# Patient Record
Sex: Male | Born: 1970 | Race: Black or African American | Hispanic: No | Smoking: Current every day smoker
Health system: Southern US, Community
[De-identification: ages and names within clinical notes are randomized; demographics above are authoritative.]

## PROBLEM LIST (undated history)

## (undated) DIAGNOSIS — B20 Human immunodeficiency virus [HIV] disease: Secondary | ICD-10-CM

## (undated) DIAGNOSIS — I10 Essential (primary) hypertension: Secondary | ICD-10-CM

## (undated) DIAGNOSIS — E785 Hyperlipidemia, unspecified: Secondary | ICD-10-CM

## (undated) DIAGNOSIS — L0232 Furuncle of buttock: Secondary | ICD-10-CM

## (undated) DIAGNOSIS — E119 Type 2 diabetes mellitus without complications: Secondary | ICD-10-CM

## (undated) DIAGNOSIS — I639 Cerebral infarction, unspecified: Secondary | ICD-10-CM

## (undated) HISTORY — DX: Essential (primary) hypertension: I10

## (undated) HISTORY — DX: Type 2 diabetes mellitus without complications: E11.9

## (undated) HISTORY — DX: Hyperlipidemia, unspecified: E78.5

## (undated) HISTORY — DX: Cerebral infarction, unspecified: I63.9

---

## 2000-11-10 ENCOUNTER — Encounter: Admission: RE | Admit: 2000-11-10 | Discharge: 2001-02-08 | Payer: Self-pay | Admitting: Anesthesiology

## 2002-04-13 ENCOUNTER — Emergency Department (HOSPITAL_COMMUNITY): Admission: EM | Admit: 2002-04-13 | Discharge: 2002-04-13 | Payer: Self-pay | Admitting: Emergency Medicine

## 2002-05-16 ENCOUNTER — Emergency Department (HOSPITAL_COMMUNITY): Admission: EM | Admit: 2002-05-16 | Discharge: 2002-05-16 | Payer: Self-pay | Admitting: Emergency Medicine

## 2003-08-07 ENCOUNTER — Emergency Department (HOSPITAL_COMMUNITY): Admission: EM | Admit: 2003-08-07 | Discharge: 2003-08-07 | Payer: Self-pay | Admitting: Emergency Medicine

## 2003-12-13 ENCOUNTER — Emergency Department (HOSPITAL_COMMUNITY): Admission: EM | Admit: 2003-12-13 | Discharge: 2003-12-13 | Payer: Self-pay | Admitting: Emergency Medicine

## 2003-12-15 ENCOUNTER — Emergency Department (HOSPITAL_COMMUNITY): Admission: EM | Admit: 2003-12-15 | Discharge: 2003-12-15 | Payer: Self-pay | Admitting: Emergency Medicine

## 2005-07-03 ENCOUNTER — Emergency Department (HOSPITAL_COMMUNITY): Admission: EM | Admit: 2005-07-03 | Discharge: 2005-07-03 | Payer: Self-pay | Admitting: Emergency Medicine

## 2005-07-10 ENCOUNTER — Emergency Department (HOSPITAL_COMMUNITY): Admission: EM | Admit: 2005-07-10 | Discharge: 2005-07-10 | Payer: Self-pay | Admitting: Family Medicine

## 2005-10-17 ENCOUNTER — Emergency Department (HOSPITAL_COMMUNITY): Admission: EM | Admit: 2005-10-17 | Discharge: 2005-10-17 | Payer: Self-pay | Admitting: Emergency Medicine

## 2005-11-22 ENCOUNTER — Emergency Department (HOSPITAL_COMMUNITY): Admission: EM | Admit: 2005-11-22 | Discharge: 2005-11-22 | Payer: Self-pay | Admitting: Emergency Medicine

## 2006-05-02 ENCOUNTER — Emergency Department (HOSPITAL_COMMUNITY): Admission: EM | Admit: 2006-05-02 | Discharge: 2006-05-02 | Payer: Self-pay | Admitting: Emergency Medicine

## 2008-03-13 ENCOUNTER — Emergency Department (HOSPITAL_COMMUNITY): Admission: EM | Admit: 2008-03-13 | Discharge: 2008-03-13 | Payer: Self-pay | Admitting: Emergency Medicine

## 2009-03-12 ENCOUNTER — Emergency Department (HOSPITAL_COMMUNITY): Admission: EM | Admit: 2009-03-12 | Discharge: 2009-03-13 | Payer: Self-pay | Admitting: Emergency Medicine

## 2011-02-22 NOTE — Procedures (Signed)
[facility]  Patient:    Bruce Yang, Bruce Yang                     MRN: [id number] Proc. Date: 11/24/00 Adm. Date:  [id number] Attending:  Thyra Breed CC:         Alinda Deem, M.D.  Workers Academic librarian   Procedure Report  PROCEDURE:  Lumbar epidural steroid injection.  DIAGNOSES:  Lumbar spondylosis with shallow broad-based right herniated disk at L5-S1.  INTERVAL HISTORY:  The patient has noted that his leg pain is markedly improved. He continues to have some lower back discomfort.  PHYSICAL EXAMINATION: VITAL SIGNS:  Blood pressure is 127/725, heart rate 80, respiratory rate 18 and saturation is 99%.  Temperature is 98.6. Pain level is 5:10 and temperature 98.7.  BACK:  His back shows good healing from his previous injection site.  NEUROLOGIC:  Neurologic exam is grossly unchanged.  DESCRIPTION OF PROCEDURE:  After informed consent was obtained, the patient was placed in a sitting position and monitored.  His back was prepped with Betadine x 3. A skin wheal was raised at the L4-5 inner space with 1% lidocaine.  A #20 gauge Tuohy needle was introduced to the lumbar epidural space to loss of resistance to preservative-free normal saline.  There was no CSF nor blood.  Medrol 80 mg and 8 mL of preservative-free normal saline was gently injected.  The needle was flushed with preservative-free normal saline and removed intact.  POSTPROCEDURE CONDITION:  Stable.  DISCHARGE INSTRUCTIONS: 1. Resume previous diet. 2. Limitation of activities per instruction sheet. 3. Continue on current medications. 4. Follow-up with me in 1 week for third injection. DD:  11/24/00 TD:  11/24/00 Job: [id number]

## 2011-02-22 NOTE — Procedures (Signed)
[facility]  Patient:    Bruce Yang, Bruce Yang                     MRN: [id number] Proc. Date: 12/08/00 Adm. Date:  [id number] Attending:  Thyra Breed CC:         Gean Birchwood, M.D.  Workers [employer]   Procedure Report  PROCEDURE:  Lumbar epidural steroid injection.  DIAGNOSES:  Lumbar spondylosis with shallow based herniated disk at L5-S1.  INTERVAL HISTORY:  The patient has noted improvement especially in his leg discomfort following the injections. He is here for his third injection today. He has noted some hyperpigmented areas on his lower extremity.  PHYSICAL EXAMINATION:  VITAL SIGNS:  Blood pressure is 135/76, heart rate 81, respiratory rate 20 and oxygen saturation is 99% and temperature is 97.5 and pain level is 7:10.  EXTREMITIES:  He exhibits scaly, hyperpigmented-type areas over his lower extremity suggestive of localized xerostomia.  BACK:  His back shows good healing from his previous injection site.  NEUROLOGIC:  Neurologic exam is grossly unchanged.  DESCRIPTION OF PROCEDURE:  After informed consent was obtained, the patient was placed in a sitting position and monitored.  His back was prepped with Betadine x 3. A skin wheal was raised at the L4-5 inner space with 1% lidocaine.  A #20 gauge Tuohy needle was introduced through the lumbar epidural space to loss of resistance to preservative-free normal saline. There was no CSF nor blood.  The depth was 5.5 cm. Medrol 80 mg and 8 mL of preservative-free normal saline was gently injected.  The needle was flushed with preservative-free normal saline and removed intact.  POSTPROCEDURE CONDITION:  Stable.  DISCHARGE INSTRUCTIONS: 1. Resume previous diet. 2. Limitation of activities per instruction sheet. 3. Continue on current medications. 4. The patient plans to follow up with Dr. Gean Birchwood. DD:  12/08/00 TD:  12/09/00 Job: [id number]

## 2011-02-22 NOTE — Procedures (Signed)
[facility]  Patient:    Bruce Yang, Bruce Yang                       MRN: [id number] Proc. Date: 11/10/00 Attending:  Thyra Breed, M.D. CC:         Alinda Deem, M.D.   Procedure Report  DATE OF BIRTH:  1970/12/26  PROCEDURE:  Lumbar epidural steroid injection.  DIAGNOSIS:  spondylolisthesis with shallow, broad-based right herniated disk at L4-5.  ANESTHESIOLOGIST:  Thyra Breed, M.D.  HISTORY:  The patient is a very pleasant 40 year old who presents with a history of a work-related injury which occurred in August 2000.  At that time, he was welding and lifted a container and felt something give in his back.  He was evaluated in [place] and treated conservatively.  At that time, he went through physical therapy and medications.  He was found to have spondylolisthesis and was advised that he needed stabilization and fusion.  He has not received epidural steroid injections.  He moved to [place], and his care is now under Dr. Turner Daniels.  The patient has been through physical therapy since presenting back in [place].  He has received  Vioxx and Mobic with no sustained improvement. He does note that Doans helps and Aleve to a mild extent.  He describes his pain as a dull, steady, aching pain in his lower back radiating out to the right lower extremity along the posterolateral and anterior aspects of the thigh and lower leg.  He denied numbness or tingling, bowel or bladder incontinence and does have some weakness of his right lower extremity with use.  His pain is made worse by walking or lying for long periods of time, sitting for long periods of time, or lifting small amounts. it is improved by sleep.  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  He does have some facet joint arthritis at 3-4.  CURRENT MEDICATIONS:  Aleve and  Doans.  ALLERGIES:  ANTIHISTAMINES cause prostate to flare up.  FAMILY HISTORY:  Positive for hypertension and stomach problems.  PAST SURGICAL HISTORY:  Negative.  SOCIAL HISTORY:  The patient is a pack-per-day smoker.  He does not drink alcohol.  He has been off work since August 2000.  ACTIVE MEDICAL PROBLEMS:  Include history of prostatism, Helicobacter gastritis, migraines, and allergic rhinitis.  REVIEW OF SYSTEMS:  General: Negative.  Head: Significant for migraine headaches.  Eyes: Negative.  Nose, Mouth, Throat: Negative.  Ears: Negative. Pulmonary: Negative.  Cardiovascular: Negative. GI/GU/Musculoskeletal/Neurologic: See HPI and Active Medical Problems. Cutaneous: The patient has dry skin with rashes over his face from this. Hematologic: Negative.  Endocrine: Negative.  Psychiatric: Negative.  Allergy and Immunologic: See Active Medical Problems.  PHYSICAL EXAMINATION:  VITAL SIGNS:  Blood pressure 139/88, heart rate 89, respiratory rate 16, O2 saturation 97% with temperature 97.6.  Pain level 5/10.  GENERAL:  This is a pleasant, anxious male in no acute distress.  HEENT:  Head was normocephalic, atraumatic.  Eyes: Extraocular movements intact with conjunctivae and sclerae clear.  Nose: Patent nares.  Oropharynx free of lesions.  NECK:  Supple without lymphadenopathy.  EXTREMITIES:  No cyanosis, clubbing, or edema with radial pulses and dorsalis pedis pulses 2+ and symmetric.  BACK:  Revealed increased pain on hyperextension to 15 degrees with reduced pain on forward flexion.  Straight leg signs negative today.  Gait was intact.  NEUROLOGIC:  The patient is oriented x 4.  Cranial nerves II-XII grossly intact.  Deep tendon reflexes were symmetric in the upper and lower extremities with downgoing toes.  Motor was 5/5 with symmetric bulk and tone. Sensory was intact to vibratory sense and pinprick.  Coordination was grossly intact.  IMPRESSION: 1. Low back pain  predominantly out to the right lower extremity with evidence    of facet joint arthropathy, herniated disk, and spondylolisthesis by MRI. 2. Other medical problems per primary care physician which include    gastropathy, prostatism, migraines, and allergic rhinitis.  DISPOSITION:  I discussed the risks, benefits, and limitations of a lumbar epidural steroid injection in detail with the patient and reviewed the side effects of corticosteroids.  He is interested in proceeding with this.  DESCRIPTION OF PROCEDURE:  After informed consent was obtained, the patient was placed in the sitting position and monitored.  His back was prepped with Betadine x 3.  A skin wheal was raised at the L3-4 interspace with 1% lidocaine.  A 20-gauge Tuohy needle was introduced to the lumbar epidural space to loss of resistance to preservative free normal saline.  There was no CSF nor blood.  Medrol 80 mg and 8 ml preservative free normal saline was gently injected.  The needle was flushed with preservative free normal saline and removed intact.  POSTPROCEDURE CONDITION:  Stable.  DISCHARGE INSTRUCTIONS: 1. Resume previous diet. 2. Limitation of activities per instruction sheet. 3. Continue on current medications. 4. Follow up with me in one to two weeks for repeat injection. DD:  11/10/00 TD:  11/11/00 Job: [id number]

## 2011-02-22 NOTE — Procedures (Signed)
[facility]  Patient:    Bruce Yang, Bruce Yang                     MRN: [id number] Proc. Date: 11/24/00 Adm. Date:  [id number] Attending:  Thyra Breed CC:         Alinda Deem, M.D.  Workers Comp Carrier   Procedure Report  PROCEDURE:  Lumbar epidural steroid injection.  DIAGNOSIS:  Spondylolisthesis with shallow broad-based right herniated disk at L5-S1.  ANESTHESIOLOGIST:  Thyra Breed, M.D.  INTERVAL HISTORY:  The patient has noted that his leg pain is markedly improved.  He continues to have some lower back discomfort.  PHYSICAL EXAMINATION:  VITAL SIGNS:  Blood pressure 127/72, heart rate 80, respiratory rate 18, O2 saturation 99%, temperature 98.6, pain level 5/10.  BACK:  Shows good healing from previous injection site.  NEUROLOGIC:  Exam is grossly unchanged.  DESCRIPTION OF PROCEDURE:  After informed consent was obtained, the patient was placed in the sitting position and monitored.  His back was prepped with Betadine x 3.  A skin wheal was raised at the L4-5 interspace with 1% lidocaine.  A 20-gauge Tuohy needle was introduced in the lumbar epidural space to loss of resistance to preservative free normal saline.  There was no CSF nor blood.  Medrol 80 mg and 8 ml preservative free normal saline was gently injected.  The needle was flushed with preservative free normal saline and removed intact.  POSTPROCEDURE CONDITION:  Stable.  DISCHARGE INSTRUCTIONS: 1. Resume previous diet. 2. Limitation of activities per instruction sheet. 3. Continue on current medications. 4. Follow up with me in one week for a third injection. DD:  11/24/00 TD:  11/24/00 Job: [id number]

## 2011-07-04 LAB — RAPID STREP SCREEN (MED CTR MEBANE ONLY): Streptococcus, Group A Screen (Direct): NEGATIVE

## 2012-02-01 ENCOUNTER — Encounter (HOSPITAL_COMMUNITY): Payer: Self-pay

## 2012-02-01 ENCOUNTER — Emergency Department (INDEPENDENT_AMBULATORY_CARE_PROVIDER_SITE_OTHER)
Admission: EM | Admit: 2012-02-01 | Discharge: 2012-02-01 | Disposition: A | Discharge status: Home / Self Care | Payer: Self-pay | Source: Home / Self Care | Attending: Emergency Medicine | Admitting: Emergency Medicine

## 2012-02-01 ENCOUNTER — Emergency Department (INDEPENDENT_AMBULATORY_CARE_PROVIDER_SITE_OTHER): Payer: Self-pay

## 2012-02-01 DIAGNOSIS — S62309A Unspecified fracture of unspecified metacarpal bone, initial encounter for closed fracture: Secondary | ICD-10-CM

## 2012-02-01 MED ORDER — TRAMADOL HCL 50 MG PO TABS: 50.0000 mg | ORAL_TABLET | Freq: Four times a day (QID) | ORAL | Status: AC | PRN

## 2012-02-01 MED ORDER — HYDROCODONE-ACETAMINOPHEN 5-325 MG PO TABS
1.0000 | ORAL_TABLET | Freq: Once | ORAL | Status: AC
  Administered 2012-02-01: 1 via ORAL

## 2012-02-01 MED ORDER — CEPHALEXIN 500 MG PO CAPS: 500.0000 mg | ORAL_CAPSULE | Freq: Four times a day (QID) | ORAL | Status: AC

## 2012-02-01 MED ORDER — HYDROCODONE-ACETAMINOPHEN 5-325 MG PO TABS
ORAL_TABLET | ORAL | Status: AC
  Filled 2012-02-01: qty 1

## 2012-02-01 NOTE — ED Provider Notes (Signed)
Chief Complaint  Patient presents with  . Hand Injury    History of Present Illness:   Rachael Fee is a 41 year old male who fell last night landing on his left hand. Ever since then he's had pain to palpation and a visible and palpable deformity of his fifth finger MCP joint. He is unable to extend it fully. It hurts to flex. It is swollen and tender to touch. He denies any numbness or tingling.  Review of Systems:  Other than noted above, the patient denies any of the following symptoms: Systemic:  No fevers, chills, sweats, or aches.  No fatigue or tiredness. Musculoskeletal:  No joint pain, arthritis, bursitis, swelling, back pain, or neck pain. Neurological:  No muscular weakness, paresthesias, headache, or trouble with speech or coordination.  No dizziness.   PMFSH:  Past medical history, family history, social history, meds, and allergies were reviewed.  Physical Exam:   Vital signs:  BP 141/88  Pulse 89  Temp(Src) 98.6 F (37 C) (Oral)  Resp 18  SpO2 96% Gen:  Alert and oriented times 3.  In no distress. Musculoskeletal: There was a palpable and visible deformity of the distal fifth metacarpal compatible with a boxer's fracture. This was tender to touch. He is unable to extend it fully but can flex it. Otherwise, all joints had a full a ROM with no swelling, bruising or deformity.  No edema, pulses full. Extremities were warm and pink.  Capillary refill was brisk.  Skin:  Clear, warm and dry.  No rash. Neuro:  Alert and oriented times 3.  Muscle strength was normal.  Sensation was intact to light touch.   Radiology:  Dg Hand Complete Left  02/01/2012  *RADIOLOGY REPORT*  Clinical Data: Reduction of fifth metacarpal fracture.  LEFT HAND - COMPLETE 3+ VIEW  Comparison: Films earlier today.  Findings: Imaging in a cast demonstrates improved alignment at the level of the distal fifth metacarpal fracture.  There remains mild volar angulation.  No other injuries.  IMPRESSION: Improved  alignment of the fifth metacarpal.  Original Report Authenticated By: Reola Calkins, M.D.   Dg Hand Complete Left  02/01/2012  *RADIOLOGY REPORT*  Clinical Data: Pain and swelling in the region of the left fifth metacarpal following a fall last night.  LEFT HAND - COMPLETE 3+ VIEW  Comparison: None.  Findings: Left fifth metacarpal neck fracture with significant ventral angulation of the distal fragment without significant displacement.  Associated soft tissue swelling.  IMPRESSION: Fifth metacarpal neck fracture, as described above.  Original Report Authenticated By: Darrol Angel, M.D.   Course in Urgent Care Center:   I called Dr. Amanda Pea and his PA came over and reduced the fracture and casted. He will followup with Dr. Amanda Pea next week.  Assessment:  The encounter diagnosis was Fracture of metacarpal.  Plan:   1.  The following meds were prescribed:   Discharge Medication List as of 02/01/2012  1:54 PM    START taking these medications   Details  cephALEXin (KEFLEX) 500 MG capsule Take 1 capsule (500 mg total) by mouth 4 (four) times daily., Starting 02/01/2012, Until Tue 02/11/12, Print    traMADol (ULTRAM) 50 MG tablet Take 1 tablet (50 mg total) by mouth every 6 (six) hours as needed for pain., Starting 02/01/2012, Until Tue 02/11/12, Print       2.  The patient was instructed in symptomatic care, including rest and activity, elevation, application of ice and compression.  Appropriate handouts were given. 3.  The patient was told to return if becoming worse in any way, if no better in 3 or 4 days, and given some red flag symptoms that would indicate earlier return.   4.  The patient was told to follow up with Dr. Amanda Pea next week.   Reuben Likes, MD 02/01/12 2126

## 2012-02-01 NOTE — ED Notes (Signed)
Pt fell last pm and has lt hand pain and swelling.

## 2012-02-01 NOTE — Discharge Instructions (Signed)
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
## 2012-04-04 ENCOUNTER — Encounter (HOSPITAL_COMMUNITY): Payer: Self-pay | Admitting: *Deleted

## 2012-04-04 ENCOUNTER — Emergency Department (HOSPITAL_COMMUNITY)
Admission: EM | Admit: 2012-04-04 | Discharge: 2012-04-04 | Disposition: A | Discharge status: Home / Self Care | Payer: Self-pay | Attending: Emergency Medicine | Admitting: Emergency Medicine

## 2012-04-04 DIAGNOSIS — K6289 Other specified diseases of anus and rectum: Secondary | ICD-10-CM | POA: Insufficient documentation

## 2012-04-04 DIAGNOSIS — Z888 Allergy status to other drugs, medicaments and biological substances status: Secondary | ICD-10-CM | POA: Insufficient documentation

## 2012-04-04 DIAGNOSIS — F17200 Nicotine dependence, unspecified, uncomplicated: Secondary | ICD-10-CM | POA: Insufficient documentation

## 2012-04-04 HISTORY — DX: Furuncle of buttock: L02.32

## 2012-04-04 MED ORDER — LIDOCAINE HCL 2 % EX GEL: CUTANEOUS | Status: AC | PRN

## 2012-04-04 MED ORDER — OXYCODONE-ACETAMINOPHEN 5-325 MG PO TABS
1.0000 | ORAL_TABLET | Freq: Once | ORAL | Status: AC
  Administered 2012-04-04: 1 via ORAL
  Filled 2012-04-04: qty 1

## 2012-04-04 MED ORDER — METRONIDAZOLE 500 MG PO TABS: 500.0000 mg | ORAL_TABLET | Freq: Two times a day (BID) | ORAL | Status: AC

## 2012-04-04 NOTE — ED Provider Notes (Signed)
History     CSN: [id number]  Arrival date & time 04/04/12  1654   First MD Initiated Contact with Patient 04/04/12 1926      Chief Complaint  Patient presents with  . Rectal Pain    Questionable abscess  . Recurrent Skin Infections    Boil    (Consider location/radiation/quality/duration/timing/severity/associated sxs/prior treatment) HPI  She presents to the emergency department with complaints of rectal pain for 1.5 months. He admits to having abscesses in the past he also admits to having hemorrhoids in the past he states that none of these feel the same. He denies having fever, pain during de fecation, bowel or urinary incontinence or hematochezia. He denies constipation or diarrhea, denies weakness, chills, nausea or vomiting. Pt is in NAD and VSS.  Past Medical History  Diagnosis Date  . Recurrent boils of anus     History reviewed. No pertinent past surgical history.  History reviewed. No pertinent family history.  History  Substance Use Topics  . Smoking status: Current Everyday Smoker -- 1.0 packs/day    Types: Cigarettes  . Smokeless tobacco: Never Used  . Alcohol Use: No      Review of Systems   HEENT: denies blurry vision or change in hearing PULMONARY: Denies difficulty breathing and SOB CARDIAC: denies chest pain or heart palpitations MUSCULOSKELETAL:  denies being unable to ambulate ABDOMEN AL: denies abdominal pain GU: denies loss of bowel or urinary control NEURO: denies numbness and tingling in extremities SKIN: no new rashes PSYCH: patient behavior is normal NECK: Not complaining of neck pain     Allergies  Antihistamines, chlorpheniramine-type  Home Medications   Current Outpatient Rx  Name Route Sig Dispense Refill  . LIDOCAINE HCL 2 % EX GEL Topical Apply topically as needed. Apply to rectum 30 mL 0  . METRONIDAZOLE 500 MG PO TABS Oral Take 1 tablet (500 mg total) by mouth 2 (two) times daily. 14 tablet 0    BP 139/90  Pulse  82  Temp 98.1 F (36.7 C) (Oral)  Resp 16  Wt 185 lb (83.915 kg)  SpO2 98%  Physical Exam  Nursing note and vitals reviewed. Constitutional: He appears well-developed and well-nourished. No distress.  HENT:  Head: Normocephalic and atraumatic.  Eyes: Pupils are equal, round, and reactive to light.  Neck: Normal range of motion. Neck supple.  Cardiovascular: Normal rate and regular rhythm.   Pulmonary/Chest: Effort normal.  Abdominal: Soft.  Genitourinary: Prostate normal. Rectal exam shows tenderness. Rectal exam shows no external hemorrhoid, no internal hemorrhoid, no fissure, no mass and anal tone normal.  Neurological: He is alert.  Skin: Skin is warm and dry.    ED Course  Procedures (including critical care time)  Labs Reviewed - No data to display No results found.   1. Rectal pain       MDM  Benign rectal exam. Significant pain during exam, I have prescribed Flagyl to cover for infection of the GI tract causing pain and written the patient for Lidocaine Jelly. He has also been given referral to GI.   Pt has been advised of the symptoms that warrant their return to the ED. Patient has voiced understanding and has agreed to follow-up with the PCP or specialist.         Dorthula Matas, PA 04/04/12 2112

## 2012-04-04 NOTE — ED Provider Notes (Signed)
Medical screening examination/treatment/procedure(s) were performed by non-physician practitioner and as supervising physician I was immediately available for consultation/collaboration.   Celene Kras, MD 04/04/12 2112

## 2012-04-04 NOTE — ED Notes (Signed)
Pt from home with reports of painful areas to rectal area that began about 2 months ago with worsening pain today. Pt endorses hx of boils that usually "pop on their own".

## 2012-04-04 NOTE — Discharge Instructions (Signed)
Proctalgia Fugax Proctalgia fugax is a very short episode of intense rectal pain. It can last from seconds to minutes. It often occurs in the night, and awakens the person from sleep. It is not a sign of cancer.  CAUSES  The cause of this often intense rectal pain is not known. One possible cause may be spasm of the pelvic muscles or of the lowest part of the large intestine.  SYMPTOMS  The pain of proctalgia fugax:  Is intensely severe.   Lasts from only a few seconds to thirty minutes.   Usually awakens the person from sleep.  DIAGNOSIS  In order to make sure that there are no other problems, diagnostic tests may be done such as:   Anoscopy. This is a lighted scope that is put into the rectum to look for abnormalities.   Barium enema. X-rays are taken after administering a radio-sensitive material.  TREATMENT  A number of things have been used to try to treat this condition, including:  Medications.   Warm baths.   Relaxation techniques.   Gentle massage of the painful area.  HOME CARE INSTRUCTIONS   Take all medications exactly as directed.   Follow any prescribed diet.   Follow instructions regarding both rest and physical activity.   Learn progressive relaxation techniques.  SEEK IMMEDIATE MEDICAL CARE IF:   Your pain does not get better in the usual amount of time.   You develop any new symptoms.  Document Released: 06/18/2001 Document Revised: 09/12/2011 Document Reviewed: 11/24/2008 [employer] Patient Information 2012 [employer]. 

## 2014-03-12 ENCOUNTER — Emergency Department (HOSPITAL_COMMUNITY): Payer: No Typology Code available for payment source

## 2014-03-12 ENCOUNTER — Encounter (HOSPITAL_COMMUNITY): Payer: Self-pay | Admitting: Emergency Medicine

## 2014-03-12 ENCOUNTER — Emergency Department (HOSPITAL_COMMUNITY)
Admission: EM | Admit: 2014-03-12 | Discharge: 2014-03-12 | Disposition: A | Discharge status: Home / Self Care | Payer: No Typology Code available for payment source | Attending: Emergency Medicine | Admitting: Emergency Medicine

## 2014-03-12 DIAGNOSIS — S62509A Fracture of unspecified phalanx of unspecified thumb, initial encounter for closed fracture: Secondary | ICD-10-CM

## 2014-03-12 DIAGNOSIS — Z791 Long term (current) use of non-steroidal anti-inflammatories (NSAID): Secondary | ICD-10-CM | POA: Insufficient documentation

## 2014-03-12 DIAGNOSIS — Y92410 Unspecified street and highway as the place of occurrence of the external cause: Secondary | ICD-10-CM | POA: Insufficient documentation

## 2014-03-12 DIAGNOSIS — F17200 Nicotine dependence, unspecified, uncomplicated: Secondary | ICD-10-CM | POA: Insufficient documentation

## 2014-03-12 DIAGNOSIS — S62609A Fracture of unspecified phalanx of unspecified finger, initial encounter for closed fracture: Secondary | ICD-10-CM | POA: Insufficient documentation

## 2014-03-12 DIAGNOSIS — Y9389 Activity, other specified: Secondary | ICD-10-CM | POA: Insufficient documentation

## 2014-03-12 MED ORDER — SILVER SULFADIAZINE 1 % EX CREA
TOPICAL_CREAM | Freq: Once | CUTANEOUS | Status: AC
  Administered 2014-03-12: 19:00:00 via TOPICAL
  Filled 2014-03-12: qty 50

## 2014-03-12 MED ORDER — CYCLOBENZAPRINE HCL 10 MG PO TABS: 10.0000 mg | ORAL_TABLET | Freq: Two times a day (BID) | ORAL | Status: DC | PRN

## 2014-03-12 MED ORDER — SILVER SULFADIAZINE 1 % EX CREA: 1.0000 "application " | TOPICAL_CREAM | Freq: Every day | CUTANEOUS | Status: DC

## 2014-03-12 MED ORDER — IBUPROFEN 800 MG PO TABS: 800.0000 mg | ORAL_TABLET | Freq: Three times a day (TID) | ORAL | Status: DC
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
## 2014-03-12 NOTE — ED Notes (Signed)
Dressing applied at this time.
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
## 2014-03-14 NOTE — ED Provider Notes (Signed)
Medical screening examination/treatment/procedure(s) were performed by non-physician practitioner and as supervising physician I was immediately available for consultation/collaboration.   EKG Interpretation None       Hurman Horn, MD 03/14/14 2211

## 2014-08-27 ENCOUNTER — Emergency Department (INDEPENDENT_AMBULATORY_CARE_PROVIDER_SITE_OTHER)
Admission: EM | Admit: 2014-08-27 | Discharge: 2014-08-27 | Disposition: A | Discharge status: Home / Self Care | Payer: Self-pay | Source: Home / Self Care | Attending: Emergency Medicine | Admitting: Emergency Medicine

## 2014-08-27 ENCOUNTER — Encounter (HOSPITAL_COMMUNITY): Payer: Self-pay | Admitting: Emergency Medicine

## 2014-08-27 DIAGNOSIS — J019 Acute sinusitis, unspecified: Secondary | ICD-10-CM

## 2014-08-27 DIAGNOSIS — J029 Acute pharyngitis, unspecified: Secondary | ICD-10-CM

## 2014-08-27 LAB — POCT RAPID STREP A: Streptococcus, Group A Screen (Direct): NEGATIVE

## 2014-08-27 MED ORDER — AMOXICILLIN 500 MG PO CAPS: 500.0000 mg | ORAL_CAPSULE | Freq: Three times a day (TID) | ORAL | Status: DC

## 2014-08-27 NOTE — ED Notes (Signed)
C/o ST , left ear pain x 3 weeks

## 2014-08-27 NOTE — Discharge Instructions (Signed)

## 2014-08-27 NOTE — ED Provider Notes (Signed)
  Chief Complaint   Sore Throat   History of Present Illness   Bruce Yang is a [age] year old male who has had a two-week history of sore throat, ear congestion, subjective fever, chills, nasal congestion with white rhinorrhea, headache, sinus pressure, aching in his eyes, and cough productive of white sputum. He denies any GI symptoms. He's had no sick exposures.  Review of Systems   Other than as noted above, the patient denies any of the following symptoms: Systemic:  No fevers, chills, sweats, or myalgias. Eye:  No redness or discharge. ENT:  No ear pain, headache, nasal congestion, drainage, sinus pressure, or sore throat. Neck:  No neck pain, stiffness, or swollen glands. Lungs:  No cough, sputum production, hemoptysis, wheezing, chest tightness, shortness of breath or chest pain. GI:  No abdominal pain, nausea, vomiting or diarrhea.  PMFSH   Past medical history, family history, social history, meds, and allergies were reviewed.   Physical exam   Vital signs:  BP 149/91 mmHg  Pulse 97  Temp(Src) 99.8 F (37.7 C) (Oral)  Resp 16  SpO2 97% General:  Alert and oriented.  In no distress.  Skin warm and dry. Eye:  No conjunctival injection or drainage. Lids were normal. ENT:  TMs and canals were normal, without erythema or inflammation.  Nasal mucosa was clear and uncongested, without drainage.  Mucous membranes were moist.  Pharynx was clear with no exudate or drainage.  There were no oral ulcerations or lesions. Neck:  Supple, no adenopathy, tenderness or mass. Lungs:  No respiratory distress.  Lungs were clear to auscultation, without wheezes, rales or rhonchi.  Breath sounds were clear and equal bilaterally.  Heart:  Regular rhythm, without gallops, murmers or rubs. Skin:  Clear, warm, and dry, without rash or lesions.  Labs   Results for orders placed or performed during the hospital encounter of 08/27/14  POCT rapid strep A [facility]  Result Value Ref  Range   Streptococcus, Group A Screen (Direct) NEGATIVE NEGATIVE    Assessment     The primary encounter diagnosis was Pharyngitis. A diagnosis of Acute sinusitis, recurrence not specified, unspecified location was also pertinent to this visit.  There is no evidence of pneumonia, strep throat, otitis media.    Plan    1.  Meds:  The following meds were prescribed:   Discharge Medication List as of 08/27/2014 12:36 PM    START taking these medications   Details  amoxicillin (AMOXIL) 500 MG capsule Take 1 capsule (500 mg total) by mouth 3 (three) times daily., Starting 08/27/2014, Until Discontinued, Normal        2.  Patient Education/Counseling:  The patient was given appropriate handouts, self care instructions, and instructed in symptomatic relief.  Instructed to get extra fluids and extra rest.    3.  Follow up:  The patient was told to follow up here if no better in 3 to 4 days, or sooner if becoming worse in any way, and given some red flag symptoms such as increasing fever, difficulty breathing, chest pain, or persistent vomiting which would prompt immediate return.       Reuben Likesavid C Delmy Holdren, MD 08/27/14 [phone]

## 2014-08-29 LAB — CULTURE, GROUP A STREP

## 2015-08-01 ENCOUNTER — Emergency Department (HOSPITAL_COMMUNITY)
Admission: EM | Admit: 2015-08-01 | Discharge: 2015-08-01 | Disposition: A | Discharge status: Home / Self Care | Payer: No Typology Code available for payment source | Source: Home / Self Care | Attending: Emergency Medicine | Admitting: Emergency Medicine

## 2015-08-01 ENCOUNTER — Encounter (HOSPITAL_COMMUNITY): Payer: Self-pay | Admitting: Emergency Medicine

## 2015-08-01 DIAGNOSIS — T148XXA Other injury of unspecified body region, initial encounter: Secondary | ICD-10-CM

## 2015-08-01 DIAGNOSIS — S29012A Strain of muscle and tendon of back wall of thorax, initial encounter: Secondary | ICD-10-CM | POA: Diagnosis not present

## 2015-08-01 MED ORDER — NAPROXEN 375 MG PO TABS: 375.0000 mg | ORAL_TABLET | Freq: Two times a day (BID) | ORAL | Status: DC

## 2015-08-01 MED ORDER — DICLOFENAC SODIUM 1 % TD GEL: 1.0000 "application " | Freq: Four times a day (QID) | TRANSDERMAL | Status: DC

## 2015-08-01 NOTE — Discharge Instructions (Signed)
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
## 2015-08-01 NOTE — ED Notes (Signed)
C/o left shoulder pain onset 2 weeks Denies inj/trauma; recalls recent wt lifting at the gym A&O x4... No acute distress.

## 2015-11-08 ENCOUNTER — Encounter (HOSPITAL_COMMUNITY): Payer: Self-pay | Admitting: Emergency Medicine

## 2015-11-08 ENCOUNTER — Emergency Department (HOSPITAL_COMMUNITY): Payer: No Typology Code available for payment source

## 2015-11-08 ENCOUNTER — Emergency Department (HOSPITAL_COMMUNITY)
Admission: EM | Admit: 2015-11-08 | Discharge: 2015-11-08 | Disposition: A | Discharge status: Home / Self Care | Payer: No Typology Code available for payment source | Attending: Physician Assistant | Admitting: Physician Assistant

## 2015-11-08 DIAGNOSIS — K529 Noninfective gastroenteritis and colitis, unspecified: Secondary | ICD-10-CM | POA: Insufficient documentation

## 2015-11-08 DIAGNOSIS — R1084 Generalized abdominal pain: Secondary | ICD-10-CM

## 2015-11-08 DIAGNOSIS — R110 Nausea: Secondary | ICD-10-CM

## 2015-11-08 DIAGNOSIS — F17210 Nicotine dependence, cigarettes, uncomplicated: Secondary | ICD-10-CM | POA: Insufficient documentation

## 2015-11-08 LAB — URINALYSIS, ROUTINE W REFLEX MICROSCOPIC
Bilirubin Urine: NEGATIVE
Glucose, UA: >1000 mg/dL — AB
Hgb urine dipstick: NEGATIVE
Ketones, ur: NEGATIVE mg/dL
Leukocytes, UA: NEGATIVE
Nitrite: NEGATIVE
Protein, ur: NEGATIVE mg/dL
Specific Gravity, Urine: 1.041 — ABNORMAL HIGH (ref 1.005–1.030)
pH: 5.5 (ref 5.0–8.0)

## 2015-11-08 LAB — CBC
HCT: 40.4 % (ref 39.0–52.0)
Hemoglobin: 13.6 g/dL (ref 13.0–17.0)
MCH: 30.1 pg (ref 26.0–34.0)
MCHC: 33.7 g/dL (ref 30.0–36.0)
MCV: 89.4 fL (ref 78.0–100.0)
Platelets: 303 10*3/uL (ref 150–400)
RBC: 4.52 MIL/uL (ref 4.22–5.81)
RDW: 12.1 % (ref 11.5–15.5)
WBC: 13 10*3/uL — ABNORMAL HIGH (ref 4.0–10.5)

## 2015-11-08 LAB — COMPREHENSIVE METABOLIC PANEL
ALT: 31 U/L (ref 17–63)
AST: 21 U/L (ref 15–41)
Albumin: 4.3 g/dL (ref 3.5–5.0)
Alkaline Phosphatase: 118 U/L (ref 38–126)
Anion gap: 12 (ref 5–15)
BUN: 10 mg/dL (ref 6–20)
CO2: 23 mmol/L (ref 22–32)
Calcium: 8.9 mg/dL (ref 8.9–10.3)
Chloride: 100 mmol/L — ABNORMAL LOW (ref 101–111)
Creatinine, Ser: 0.81 mg/dL (ref 0.61–1.24)
GFR calc Af Amer: >60 mL/min (ref >60)
GFR calc non Af Amer: >60 mL/min (ref >60)
Glucose, Bld: 263 mg/dL — ABNORMAL HIGH (ref 65–99)
Potassium: 3.8 mmol/L (ref 3.5–5.1)
Sodium: 135 mmol/L (ref 135–145)
Total Bilirubin: 0.7 mg/dL (ref 0.3–1.2)
Total Protein: 7.6 g/dL (ref 6.5–8.1)

## 2015-11-08 LAB — URINE MICROSCOPIC-ADD ON: Bacteria, UA: NONE SEEN

## 2015-11-08 LAB — LIPASE, BLOOD: Lipase: 26 U/L (ref 11–51)

## 2015-11-08 MED ORDER — SODIUM CHLORIDE 0.9 % IV BOLUS (SEPSIS)
1000.0000 mL | Freq: Once | INTRAVENOUS | Status: AC
  Administered 2015-11-08: 1000 mL via INTRAVENOUS

## 2015-11-08 MED ORDER — MORPHINE SULFATE (PF) 4 MG/ML IV SOLN
4.0000 mg | Freq: Once | INTRAVENOUS | Status: DC
  Filled 2015-11-08: qty 1

## 2015-11-08 MED ORDER — IOHEXOL 300 MG/ML  SOLN
25.0000 mL | Freq: Once | INTRAMUSCULAR | Status: AC | PRN
  Administered 2015-11-08: 25 mL via ORAL

## 2015-11-08 MED ORDER — IBUPROFEN 800 MG PO TABS: 800.0000 mg | ORAL_TABLET | Freq: Three times a day (TID) | ORAL | Status: DC

## 2015-11-08 MED ORDER — CIPROFLOXACIN HCL 500 MG PO TABS: 500.0000 mg | ORAL_TABLET | Freq: Two times a day (BID) | ORAL | Status: DC

## 2015-11-08 MED ORDER — METRONIDAZOLE 500 MG PO TABS: 500.0000 mg | ORAL_TABLET | Freq: Three times a day (TID) | ORAL | Status: DC

## 2015-11-08 MED ORDER — ACETAMINOPHEN 325 MG PO TABS
650.0000 mg | ORAL_TABLET | Freq: Once | ORAL | Status: AC
  Administered 2015-11-08: 650 mg via ORAL
  Filled 2015-11-08: qty 2

## 2015-11-08 MED ORDER — IOHEXOL 300 MG/ML  SOLN
100.0000 mL | Freq: Once | INTRAMUSCULAR | Status: AC | PRN
  Administered 2015-11-08: 100 mL via INTRAVENOUS

## 2015-11-08 NOTE — ED Notes (Signed)
Patient transported to CT 

## 2015-11-08 NOTE — Progress Notes (Signed)
Entered in d/c instructions please use the resources provided to you by the ED Case manager to find a pcp for follow up care Schedule an appointment as soon as possible for a visit As needed

## 2015-11-08 NOTE — ED Provider Notes (Signed)
CSN: [id number]     Arrival date & time 11/08/15  1013 History   First MD Initiated Contact with Patient 11/08/15 1100     Chief Complaint  Patient presents with  . Abdominal Pain     (Consider location/radiation/quality/duration/timing/severity/associated sxs/prior Treatment) HPI   Bruce Yang is a 45 y.o. male with PMH significant for recurrent boils of anus who presents with 6 days of intermittent, moderate, worsening, generalized, cramping abdominal pain that radiates to the back.  Aggravating factors include BMs.  Modifying factors include belching.  No meds PTA.  Patient reports last night he began experiencing NBNB emesis and nausea.  He reports 2 episodes.  Endorses generalized weakness, headache, subjective fever, and changes in BMs.  He reports he alternates between hard stools and soft stools.  Last BM this morning.  Denies tenesmus, melena, hematochezia, CP, SOB, or urinary symptoms.  No history of abdominal surgeries.  Endorses sick contacts at work.  Denies recent abx use or travel.  Denies EtOH use.  He smokes marijuana, and when asked to quantify he states "all day every day".  He smokes tobacco, approximately 0.5 ppd.     Past Medical History  Diagnosis Date  . Recurrent boils of anus    History reviewed. No pertinent past surgical history. No family history on file. Social History  Substance Use Topics  . Smoking status: Current Every Day Smoker -- 1.00 packs/day    Types: Cigarettes  . Smokeless tobacco: Never Used  . Alcohol Use: No    Review of Systems  All other systems negative unless otherwise stated in HPI   Allergies  Antihistamines, chlorpheniramine-type  Home Medications   Prior to Admission medications   Medication Sig Start Date End Date Taking? Authorizing Provider  amoxicillin (AMOXIL) 500 MG capsule Take 1 capsule (500 mg total) by mouth 3 (three) times daily. Patient not taking: Reported on 11/08/2015 08/27/14   Reuben Likes, MD   ciprofloxacin (CIPRO) 500 MG tablet Take 1 tablet (500 mg total) by mouth 2 (two) times daily. 11/08/15   Cheri Fowler, PA-C  cyclobenzaprine (FLEXERIL) 10 MG tablet Take 1 tablet (10 mg total) by mouth 2 (two) times daily as needed for muscle spasms. Patient not taking: Reported on 11/08/2015 03/12/14   Marlon Pel, PA-C  diclofenac sodium (VOLTAREN) 1 % GEL Apply 1 application topically 4 (four) times daily. Patient not taking: Reported on 11/08/2015 08/01/15   Hayden Rasmussen, NP  ibuprofen (ADVIL,MOTRIN) 800 MG tablet Take 1 tablet (800 mg total) by mouth 3 (three) times daily. 11/08/15   Cheri Fowler, PA-C  metroNIDAZOLE (FLAGYL) 500 MG tablet Take 1 tablet (500 mg total) by mouth 3 (three) times daily. 11/08/15   Cheri Fowler, PA-C  naproxen (NAPROSYN) 375 MG tablet Take 1 tablet (375 mg total) by mouth 2 (two) times daily. Patient not taking: Reported on 11/08/2015 08/01/15   Hayden Rasmussen, NP  silver sulfADIAZINE (SILVADENE) 1 % cream Apply 1 application topically daily. Patient not taking: Reported on 11/08/2015 03/12/14   Marlon Pel, PA-C   BP 131/82 mmHg  Pulse 81  Temp(Src) 98.7 F (37.1 C) (Oral)  Resp 17  SpO2 100% Physical Exam  Constitutional: He is oriented to person, place, and time. He appears well-developed and well-nourished.  Non-toxic appearance. He does not have a sickly appearance. He does not appear ill.  HENT:  Head: Normocephalic and atraumatic.  Mouth/Throat: Oropharynx is clear and moist.  Eyes: Conjunctivae are normal. Pupils are equal, round, and reactive to  light.  Neck: Normal range of motion. Neck supple.  Cardiovascular: Normal rate, regular rhythm and normal heart sounds.   No murmur heard. Pulmonary/Chest: Effort normal and breath sounds normal. No accessory muscle usage or stridor. No respiratory distress. He has no wheezes. He has no rhonchi. He has no rales.  Abdominal: Soft. Bowel sounds are normal. He exhibits distension (mild). There is generalized tenderness. There  is guarding (mild). There is no rebound.  Musculoskeletal: Normal range of motion.  Lymphadenopathy:    He has no cervical adenopathy.  Neurological: He is alert and oriented to person, place, and time.  Speech clear without dysarthria.  Skin: Skin is warm and dry.  Psychiatric: He has a normal mood and affect. His behavior is normal.    ED Course  Procedures (including critical care time) Labs Review Labs Reviewed  COMPREHENSIVE METABOLIC PANEL - Abnormal; Notable for the following:    Chloride 100 (*)    Glucose, Bld 263 (*)    All other components within normal limits  CBC - Abnormal; Notable for the following:    WBC 13.0 (*)    All other components within normal limits  URINALYSIS, ROUTINE W REFLEX MICROSCOPIC (NOT AT [facility]) - Abnormal; Notable for the following:    Specific Gravity, Urine 1.041 (*)    Glucose, UA >1000 (*)    All other components within normal limits  URINE MICROSCOPIC-ADD ON - Abnormal; Notable for the following:    Squamous Epithelial / LPF 0-5 (*)    All other components within normal limits  LIPASE, BLOOD    Imaging Review Ct Abdomen Pelvis W Contrast  11/08/2015  CLINICAL DATA:  Generalized abdominal pain for 6 days. EXAM: CT ABDOMEN AND PELVIS WITH CONTRAST TECHNIQUE: Multidetector CT imaging of the abdomen and pelvis was performed using the standard protocol following bolus administration of intravenous contrast. CONTRAST:  OMNIPAQUE IOHEXOL 300 MG/ML  SOLN COMPARISON:  None. FINDINGS: Lower chest: Subpleural 3 mm anterior right middle lobe solid pulmonary nodule (series 4/image 9). Subpleural 8 x 4 mm (average 6 mm) pulmonary nodule adjacent to the right major fissure in the anterior right lower lobe (series 4/image 3). Basilar left lower lobe 6 mm solid pulmonary nodule (series 4/image 13) . Hepatobiliary: Diffuse hepatic steatosis. No liver mass. Normal gallbladder with no radiopaque cholelithiasis. No biliary ductal dilatation. Pancreas: Normal,  with no mass or duct dilation. Spleen: Normal size. No mass. Adrenals/Urinary Tract: Normal adrenals. No hydronephrosis. No renal mass. There are nonobstructing 2 mm stones in the interpolar kidneys bilaterally. Normal bladder. Stomach/Bowel: Grossly normal stomach. Normal caliber small bowel with no small bowel wall thickening. Normal appendix. There is borderline mild wall thickening with associated minimal pericolonic fat stranding involving the descending colon and sigmoid colon, with associated fluid levels in the distal colon. Vascular/Lymphatic: Atherosclerotic nonaneurysmal abdominal aorta. Patent portal, splenic, hepatic and renal veins. No pathologically enlarged lymph nodes in the abdomen or pelvis. Nonspecific mild haziness of the central mesentery. Reproductive: Mild prostatomegaly with nonspecific internal prostatic calcifications. Other: No pneumoperitoneum, ascites or focal fluid collection. Musculoskeletal: No aggressive appearing focal osseous lesions. 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 IMPRESSION: 1. CT findings suggestive of a mild nonspecific infectious or inflammatory colitis of the distal colon, with the differential including C. diff colitis. No colonic diverticulosis. 2. Pulmonary nodules at the lung bases measuring up to 6 mm. If the patient is at high risk for bronchogenic carcinoma, follow-up chest CT  at 6-12 months is recommended. If the patient is at low risk for bronchogenic carcinoma, follow-up chest CT at 12 months is recommended. This recommendation follows the consensus statement: Guidelines for Management of Small Pulmonary Nodules Detected on CT Scans: A Statement from the Fleischner Society as published in Radiology 2005;237:395-400. 3. Tiny nonobstructing stones in both kidneys.  No hydronephrosis. 4. Diffuse hepatic steatosis. 5. 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 Electronically Signed   By: Delbert Phenix M.D.   On: 11/08/2015 15:06   I have personally reviewed and evaluated these images and lab results as part of my medical decision-making.   EKG Interpretation None      MDM   Final diagnoses:  Generalized abdominal pain  Nausea  Colitis    Patient presents with generalized abdominal pain and N/V.  VSS, NAD.  On exam, mild distension and generalized tenderness with mild guarding.  Labs largely unremarkable.  UA remarkable for >1000 glucose; otherwise negative.  CMP remarkable for glucose 263; otherwise unremarkble. Patient will follow up with PCP regarding glucose management. CBC remarkable for WBC 13.  CT abdomen/pelvis remarkable for mild nonspecific infectious or inflammatory colitis of distal colon.  No recent abx or risk factors.  Doubt c. diff.  CT also remarkable for pulmonary nodules at the lung bases measuring up to 6 mm.  Recommend follow-up chest CT at 6-12 months given smoking history.  Tiny nonobstructing stones in both kidneys.  No hydronephrosis. Diffuse hepatic steatosis.  B/l L5 pars defects with severe degenerative disc disease and 9 mm anterolisthesis at L5-S1.  Plan to discharge home with ibuprofen, cipro, and flagyl.  Discussed findings and plan with patient.  Follow up PCP.  Discussed return precautions.  Patient agrees and acknowledges the above plan for discharge. Case has been discussed with Dr. Corlis Leak who agrees with the above plan for discharge.      Cheri Fowler, PA-C 11/08/15 1550  Courteney Randall An, MD 11/10/15 1534

## 2015-11-08 NOTE — Progress Notes (Signed)
Pt confirms he has [employer] pos plan but no pcp  CM provided pt a 10 page list of [facility] in network pcp for follow up care  Pt appreciative of resources provided

## 2015-11-08 NOTE — ED Notes (Signed)
Per pt, states abdominal pain, possible umbilical hernia-vomiting since last night-loose stool-was seen at [facility] today-dent here for eval

## 2015-11-08 NOTE — Discharge Instructions (Signed)
Colitis Colitis is inflammation of the colon. Colitis may last a short time (acute) or it may last a long time (chronic). CAUSES This condition may be caused by:  Viruses.  Bacteria.  Reactions to medicine.  Certain autoimmune diseases, such as Crohn disease or ulcerative colitis. SYMPTOMS Symptoms of this condition include:  Diarrhea.  Passing bloody or tarry stool.  Pain.  Fever.  Vomiting.  Tiredness (fatigue).  Weight loss.  Bloating.  Sudden increase in abdominal pain.  Having fewer bowel movements than usual. DIAGNOSIS This condition is diagnosed with a stool test or a blood test. You may also have other tests, including X-rays, a CT scan, or a colonoscopy. TREATMENT Treatment may include:  Resting the bowel. This involves not eating or drinking for a period of time.  Fluids that are given through an IV tube.  Medicine for pain and diarrhea.  Antibiotic medicines.  Cortisone medicines.  Surgery. HOME CARE INSTRUCTIONS Eating and Drinking  Follow instructions from your health care provider about eating or drinking restrictions.  Drink enough fluid to keep your urine clear or pale yellow.  Work with a dietitian to determine which foods cause your condition to flare up.  Avoid foods that cause flare-ups.  Eat a well-balanced diet. Medicines  Take over-the-counter and prescription medicines only as told by your health care provider.  If you were prescribed an antibiotic medicine, take it as told by your health care provider. Do not stop taking the antibiotic even if you start to feel better. General Instructions  Keep all follow-up visits as told by your health care provider. This is important. SEEK MEDICAL CARE IF:  Your symptoms do not go away.  You develop new symptoms. SEEK IMMEDIATE MEDICAL CARE IF:  You have a fever that does not go away with treatment.  You develop chills.  You have extreme weakness, fainting, or  dehydration.  You have repeated vomiting.  You develop severe pain in your abdomen.  You pass bloody or tarry stool.   This information is not intended to replace advice given to you by your health care provider. Make sure you discuss any questions you have with your health care provider.   Document Released: 10/31/2004 Document Revised: 06/14/2015 Document Reviewed: 01/16/2015 Elsevier Interactive Patient Education 2016 [employer].   Emergency Department Resource Guide 1) Find a Doctor and Pay Out of Pocket Although you won't have to find out who is covered by your insurance plan, it is a good idea to ask around and get recommendations. You will then need to call the office and see if the doctor you have chosen will accept you as a new patient and what types of options they offer for patients who are self-pay. Some doctors offer discounts or will set up payment plans for their patients who do not have insurance, but you will need to ask so you aren't surprised when you get to your appointment.  2) Contact Your Local Health Department Not all health departments have doctors that can see patients for sick visits, but many do, so it is worth a call to see if yours does. If you don't know where your local health department is, you can check in your phone book. The CDC also has a tool to help you locate your state's health department, and many state websites also have listings of all of their local health departments.  3) Find a [facility] If your illness is not likely to be very severe or complicated, you may  want to try a walk in clinic. These are popping up all over the country in pharmacies, drugstores, and shopping centers. They're usually staffed by nurse practitioners or physician assistants that have been trained to treat common illnesses and complaints. They're usually fairly quick and inexpensive. However, if you have serious medical issues or chronic medical problems, these are  probably not your best option.  No Primary Care Doctor: - Call Health Connect at  [phone] - they can help you locate a primary care doctor that  accepts your insurance, provides certain services, etc. - Physician Referral Service- [phone]  Chronic Pain Problems: Organization         Address  Phone   Notes  [facility]  [phone] Patients need to be referred by their primary care doctor.   Medication Assistance: Organization         Address  Phone   Notes  [facility] Medication [facility] [address] [place], [place] [address] [phone] --Must be a resident of [facility] -- Must have NO insurance coverage whatsoever (no Medicaid/ Medicare, etc.) -- The pt. MUST have a primary care doctor that directs their care regularly and follows them in the community   [facility]ist  [phone]   [employer]  [phone]    Agencies that provide inexpensive medical care: Organization         Address  Phone   Notes  [place]  [phone]   [facility]    [phone]   [facility],  [address] [phone]   [place] [address] [place] [phone]   Planned Parenthood    [phone]   [facility]    [phone]   [place] [address], [place] Phone:  [phone], Fax:  [phone] Hours of Operation:  9 am - 6 pm, M-F.  Also accepts Medicaid/Medicare and self-pay.  [facility] for [address], [address], [place] Phone: [phone], Fax: [phone]. Hours of Operation:  8:30 am - 5:30 pm, M-F.  Also accepts Medicaid and self-pay.  [facility] [address], [place] Phone: [phone]   [place], [place], [place] [phone], Ext. [phone] Mondays & Thursdays:  7-9 AM.  First 15 patients are seen on a first come, first serve basis.    [place] Providers:  Organization         Address  Phone   Notes  [facility] [address], [place] [phone] Also accepts self-pay patients.  [facility] [id number] [place] [address], [place]  [phone]   [place], [address], [place] [phone]   [facility] [address] [place] [phone]   Lucianne Lei [address], [place]   [phone] Only accepts [place] Access [place] patients after they have their name applied to their card.   Self-Pay (no insurance) in [facility]:  Organization         Address  Phone   Notes  Sickle Cell Patients, [facility] [place] [phone]   [facility] [place] [phone])  [facility] [place]  [place], [address], [place] [phone]   [facility]/Dr. Osei-Bonsu  [address] [place] or [address], [place] [phone] Phone number for both [place] and [place] locations is the same.  Urgent Medical and [facility] [address], [place] [phone]   [facility] [address] [place] or [address] [phone]   [facility] [address] [place] [phone], phone; [phone], fax Sees patients 1st and 3rd Saturday of every month.  Must not qualify for public or private insurance (i.e. Medicaid, Medicare, [place] Health Choice, Veterans' Benefits)  Household income should be no more than 200% of the poverty level The clinic cannot treat you if you are pregnant or think you are pregnant  Sexually transmitted diseases are not treated at the clinic.     Dental Care: Organization         Address  Phone  Notes  [facility] Department of [facility] [phone] Accepts children up to age 55 who are enrolled in [place] or [place]; pregnant women with a Medicaid card; and children who have applied for Medicaid or [place] Health Choice, but were declined, whose parents can pay a reduced fee at time of service.  [facility] Department of [facility]  [address], [place] [phone] Accepts children up to age 89 who are enrolled in [place] or [place]; pregnant women with a Medicaid card; and children who have applied for Medicaid or [place] Health Choice, but were declined, whose parents can pay a reduced fee at time of service.  [place] [facility] Access PROGRAM  [place] [phone] Patients are seen by appointment only. Walk-ins are not accepted. Olds will see patients 44 years of age and older. Monday - Tuesday (8am-5pm) Most Wednesdays (8:30-5pm) $30 per visit, cash only  [facility] Adult Dental Access PROGRAM  [address], [facility] [phone] Patients are seen by appointment only. Walk-ins are not accepted. [place] will see patients 49 years of age and older. One Wednesday Evening (Monthly: Volunteer Based).  $30 per visit, cash only  [place]  [phone] for adults; Children under age 42, call Graduate Pediatric Dentistry at [phone]. Children aged 12-14, please call [phone] to request a pediatric application.  Dental services are provided in all areas of dental care including fillings, crowns and bridges, complete and partial dentures, implants, gum treatment, root canals, and extractions. Preventive care is also provided. Treatment is provided to both adults and children. Patients are selected via a lottery and there is often a waiting  list.   [facility] [address], [place]  [phone] [email or web address]   [facility] [address] [place], [place] [phone], Ext. [phone] Second and Fourth Thursday of each month, opens at 6:30 AM; Clinic ends at 9 AM.  Patients are seen on a first-come first-served basis, and a limited number are seen during each clinic.   [facility]  [address] Hillard Danker [place], [place] [phone]   Eligibility Requirements You must have lived in [place], [place], or [place] counties for at least the last three months.   You cannot be eligible for state  or Teacher, music, including [employer], [place], or [employer].   You generally cannot be eligible for healthcare insurance through your employer.    How to apply: Eligibility screenings are held every Tuesday and Wednesday afternoon from 1:00 pm until 4:00 pm. You do not need an appointment for the interview!  [facility] [address] [place], [place] [phone]   [facility] Health Department  [phone]   [facility] Department  [phone]   [facility] Health Department  [phone]    Behavioral Health Resources in the Community: Intensive Outpatient Programs Organization         Address  Phone  Notes  [facility] Services [address] [place], [place] [phone]   [facility] Outpatient [address], [place], [place] [phone]   ADS: Alcohol [facility] Drug S[facility] [address] [place], [place]  [phone]   [facility] Mental Health [address],  [place], [place] [phone] or [phone]   Substance Abuse Resources Organization         Address  Phone  Notes  [facility] Drug Services  [phone]   [facility]  [phone]   [facility] [place]  [phone]   [place]oydene [place]ock  [phone]   Residential & Outpatient Substance Abuse Program   [phone]   Psychological Services Organization         Address  Phone  Notes  [facility] Behavioral Health  [phone]   [facility] Services  [phone]   [facility] Mental Health [address] [place] [phone] or [phone]    Mobile Crisis Teams Organization         Address  Phone  Notes  [facility], Mobile Crisis Care Unit  [phone]   [facility]  [address] [place], [place] [phone]   Doristine Locks [address] [place] [phone]    Self-Help/Support Groups Organization         Address  Phone             Notes  [facility] [place] - variety of support groups  336- I7437963 Call for more information  Narcotics Anonymous (NA), [facility] [address], [employer] [place]  2 meetings at this location   Statistician         Address  Phone  Notes  [facility] [address],    [place]  [phone]   [facility]  [address], [place] [address], [place], [place] [phone]   [facility] [address] [place], [place] [phone] Admissions: 8am-3pm M-F  [facility] [address],    [place], [place] [phone]   [facility] [address] Starling Manns [place], [place] [phone]   [facility] [address],  [place], [place] [phone]   [facility] - Intensive Outpatient [address], [place], [place] [phone]   [facility] (Addiction Recovery Care [facility].) [address]  [place], [place] [phone] or [phone]   [facility] (RTS) [address], [place], [place] [phone] Accepts Medicaid  [facility] [address],  [place] [phone] Substance Abuse/Addiction Treatment   [facility] Organization          Address  Phone  Notes  [facility]  [phone]   Angie Fava, PhD [address] [place], [place]   (  [phone] or [phone]) [phone]   [facility]   [address] [place], [place] [phone]   [employer] [address], [place], [place] [phone] Insurance/Medicaid/sponsorship through [facility] and Families [address]                                    [place], [place] [phone] [place] [address]   [place], [place] [phone]    Dr. Adele Schilder  [phone]   [facility] Dept. 1) [address], [place] 2) [place] 3)  [place] [address], [place] [phone]  [phone]   [place] [phone] or [phone] (After Hours)

## 2017-05-23 IMAGING — CT CT ABD-PELV W/ CM
2 of 5 series · 16 of 46 positions shown, 18 images · IV contrast (OMNIPAQUE)
Comparison: None.

CLINICAL DATA: Generalized abdominal pain for 6 days.

EXAM:
CT ABDOMEN AND PELVIS WITH CONTRAST
TECHNIQUE: Multidetector CT imaging of the abdomen and pelvis was performed
using the standard protocol following bolus administration of
intravenous contrast.
CONTRAST:  100mL OMNIPAQUE IOHEXOL 300 MG/ML  SOLN

[Series 2: rtn a/p with · axial · 0.74mm/px · z∈[+892,+1302]mm · 13 of 92 slices shown, 15 images]
[im 5/92  soft-tissue]
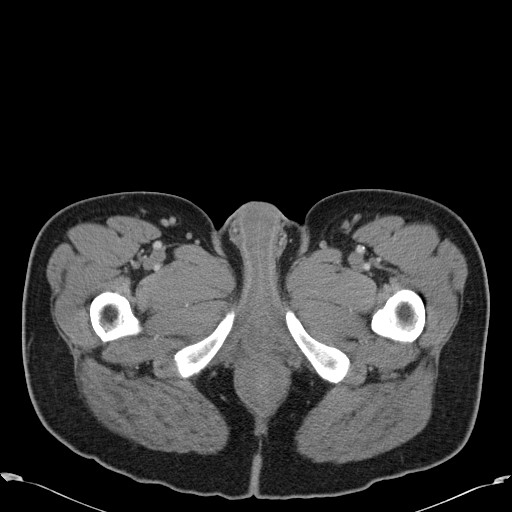
[im 5/92  bone]
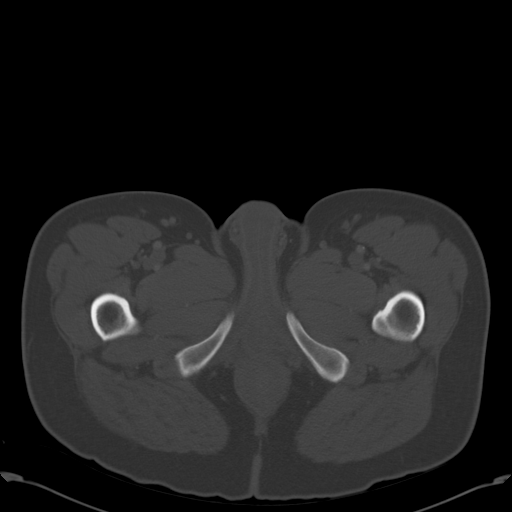
[im 15/92  soft-tissue]
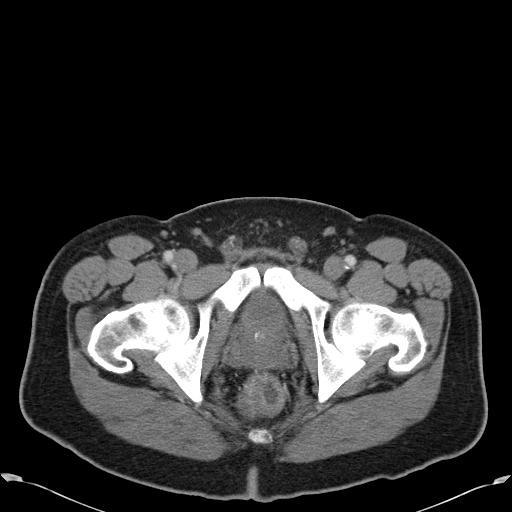
[im 20/92  soft-tissue]
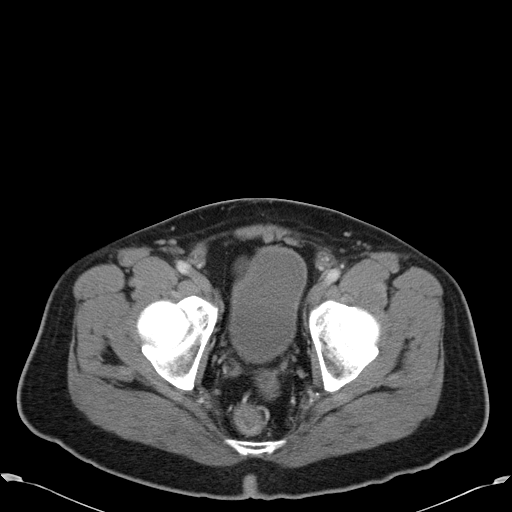
[im 24/92  soft-tissue]
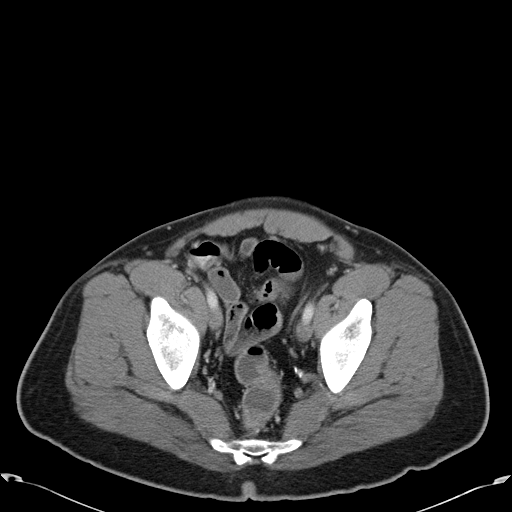
[im 34/92  soft-tissue]
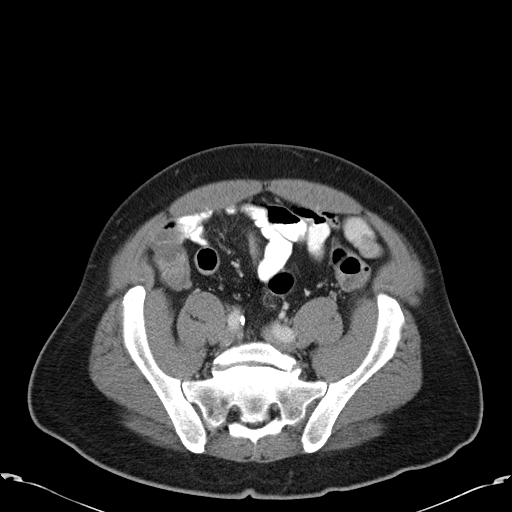
[im 39/92  soft-tissue]
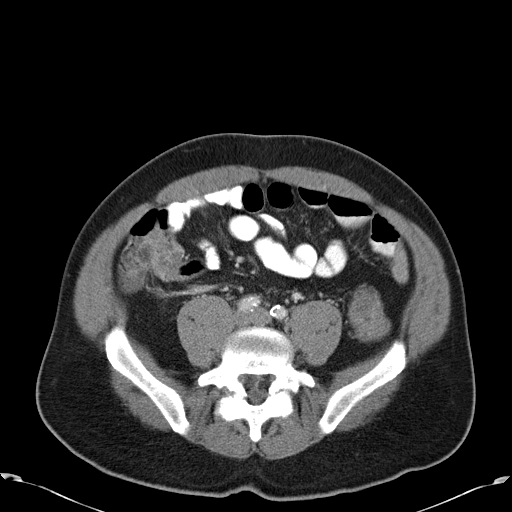
[im 48/92  soft-tissue]
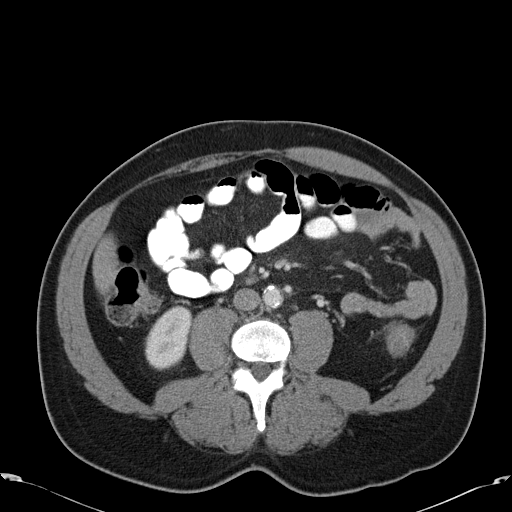
[im 53/92  soft-tissue]
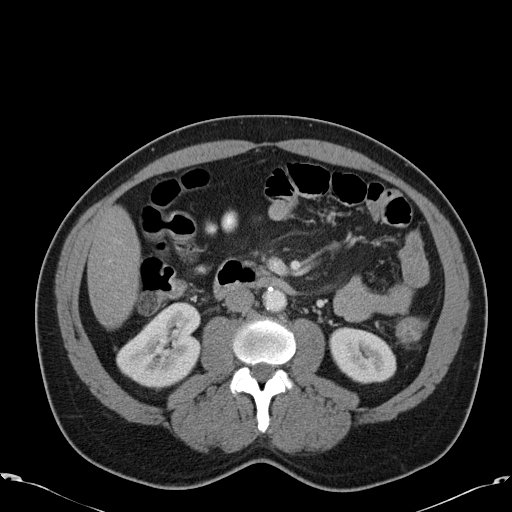
[im 58/92  soft-tissue]
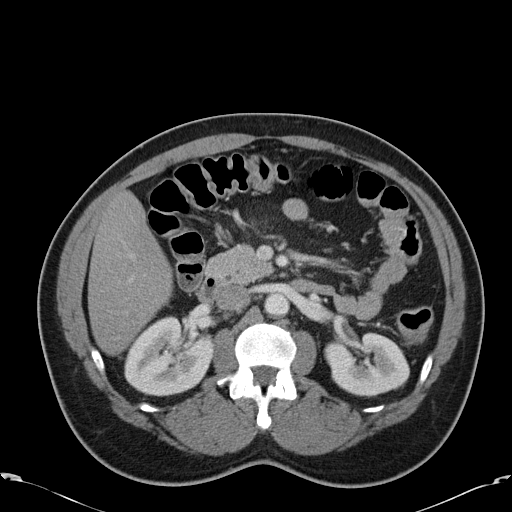
[im 58/92  bone]
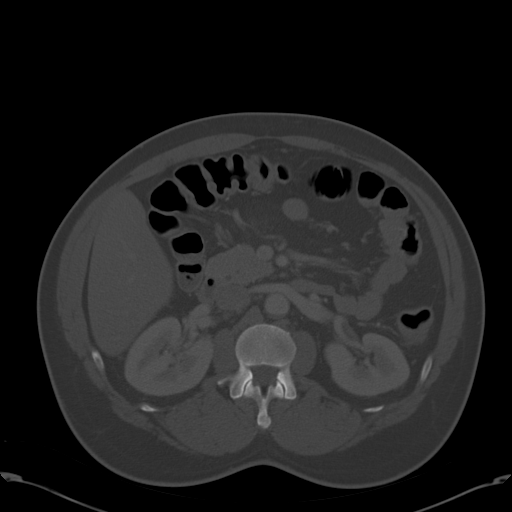
[im 68/92  soft-tissue]
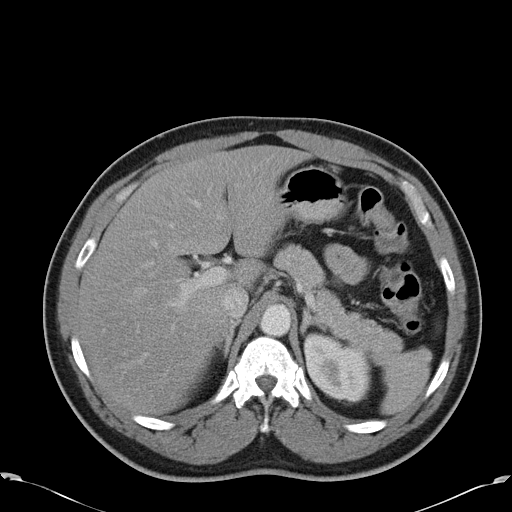
[im 72/92  soft-tissue]
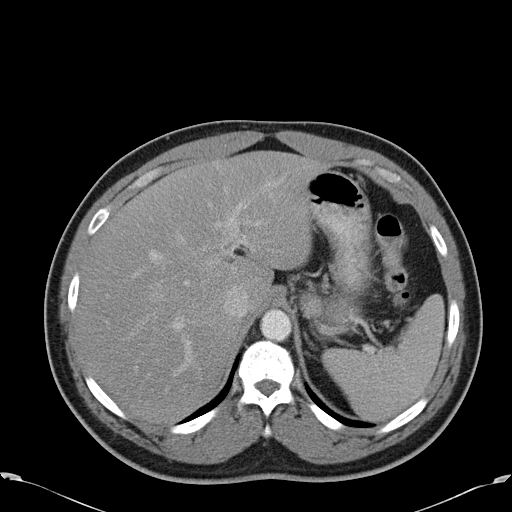
[im 77/92  soft-tissue]
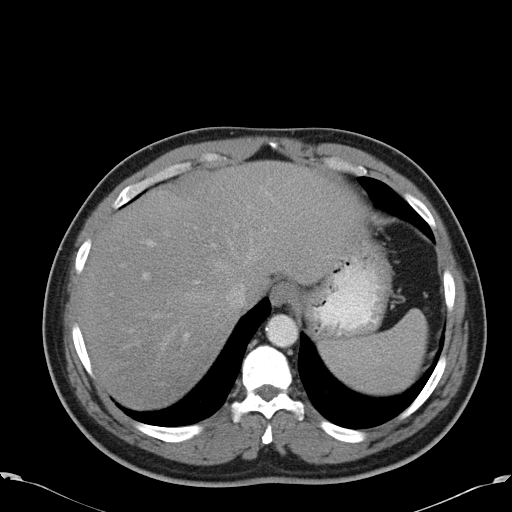
[im 87/92  soft-tissue]
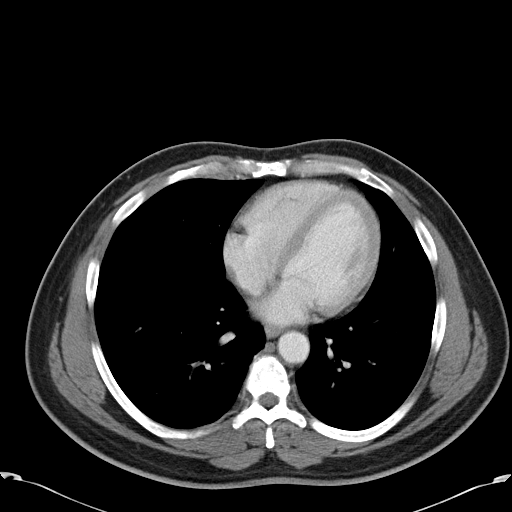

[Series 602: <mpr thick range> · coronal · 0.90mm/px · 3 of 94 slices shown]
[im 32/94  soft-tissue]
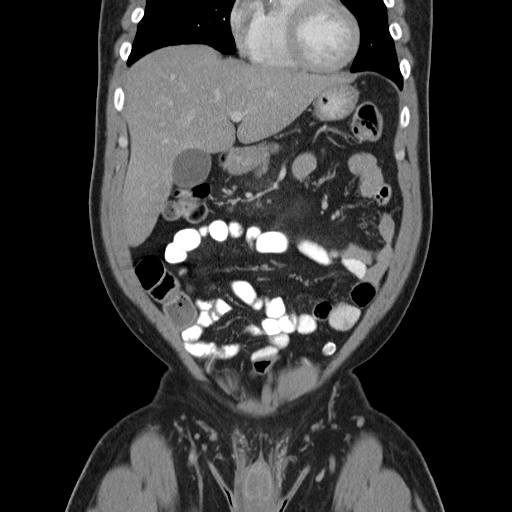
[im 42/94  soft-tissue]
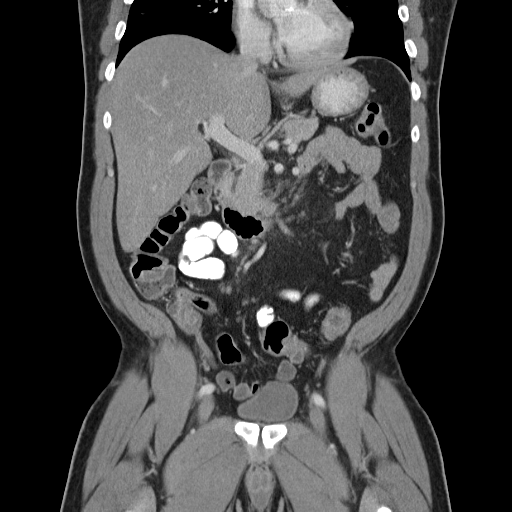
[im 52/94  soft-tissue]
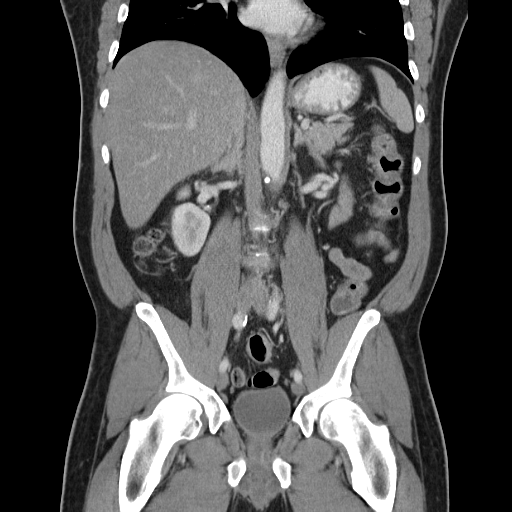

[16 of 46 positions shown; findings below may reference images not displayed]

FINDINGS: Lower chest: Subpleural 3 mm anterior right middle lobe solid
pulmonary nodule (series 4/image 9). Subpleural 8 x 4 mm (average 6
mm) pulmonary nodule adjacent to the right major fissure in the
anterior right lower lobe (series 4/image 3). Basilar left lower
lobe 6 mm solid pulmonary nodule (series 4/image 13) .

Hepatobiliary: Diffuse hepatic steatosis. No liver mass. Normal
gallbladder with no radiopaque cholelithiasis. No biliary ductal
dilatation.

Pancreas: Normal, with no mass or duct dilation.

Spleen: Normal size. No mass.

Adrenals/Urinary Tract: Normal adrenals. No hydronephrosis. No renal
mass. There are nonobstructing 2 mm stones in the interpolar kidneys
bilaterally. Normal bladder.

Stomach/Bowel: Grossly normal stomach. Normal caliber small bowel
with no small bowel wall thickening. Normal appendix. There is
borderline mild wall thickening with associated minimal pericolonic
fat stranding involving the descending colon and sigmoid colon, with
associated fluid levels in the distal colon.

Vascular/Lymphatic: Atherosclerotic nonaneurysmal abdominal aorta.
Patent portal, splenic, hepatic and renal veins. No pathologically
enlarged lymph nodes in the abdomen or pelvis. Nonspecific mild
haziness of the central mesentery.

Reproductive: Mild prostatomegaly with nonspecific internal
prostatic calcifications.

Other: No pneumoperitoneum, ascites or focal fluid collection.

Musculoskeletal: No aggressive appearing focal osseous lesions.
Bilateral L5 pars defects with severe degenerative disc disease and
9 mm anterolisthesis at L5-S1.
IMPRESSION: 1. CT findings suggestive of a mild nonspecific infectious or
inflammatory colitis of the distal colon, with the differential
including C. diff colitis. No colonic diverticulosis.
2. Pulmonary nodules at the lung bases measuring up to 6 mm. If the
patient is at high risk for bronchogenic carcinoma, follow-up chest
CT at 6-12 months is recommended. If the patient is at low risk for
bronchogenic carcinoma, follow-up chest CT at 12 months is
recommended. This recommendation follows the consensus statement:
Guidelines for Management of Small Pulmonary Nodules Detected on CT
Scans: A Statement from the [HOSPITAL] as published in
3. Tiny nonobstructing stones in both kidneys.  No hydronephrosis.
4. Diffuse hepatic steatosis.
5. Bilateral L5 pars defects with severe degenerative disc disease
and 9 mm anterolisthesis at L5-S1.

## 2018-05-17 ENCOUNTER — Other Ambulatory Visit: Payer: Self-pay

## 2018-05-17 ENCOUNTER — Encounter (HOSPITAL_COMMUNITY): Payer: Self-pay | Admitting: Emergency Medicine

## 2018-05-17 ENCOUNTER — Ambulatory Visit (HOSPITAL_COMMUNITY)
Admission: EM | Admit: 2018-05-17 | Discharge: 2018-05-17 | Disposition: A | Discharge status: Home / Self Care | Payer: No Typology Code available for payment source | Attending: Urgent Care | Admitting: Urgent Care

## 2018-05-17 DIAGNOSIS — M5432 Sciatica, left side: Secondary | ICD-10-CM

## 2018-05-17 DIAGNOSIS — M79672 Pain in left foot: Secondary | ICD-10-CM

## 2018-05-17 DIAGNOSIS — M79605 Pain in left leg: Secondary | ICD-10-CM

## 2018-05-17 MED ORDER — PREDNISONE 20 MG PO TABS: ORAL_TABLET | ORAL | 0 refills | Status: DC

## 2018-05-17 MED ORDER — CYCLOBENZAPRINE HCL 5 MG PO TABS: 5.0000 mg | ORAL_TABLET | Freq: Every evening | ORAL | 0 refills | Status: DC | PRN

## 2018-05-17 NOTE — ED Provider Notes (Signed)
  MRN: [id number] DOB: 02/14/1971  Subjective:   Bruce Yang is a 47 y.o. male presenting for 1 month history of persistent left buttock pain that radiates to his hip, left posterior thigh that goes all the way down to his foot.  Denies fever, trauma, falls.  Has not tried medications for relief.  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  States that he hydrates very well with at least a gallon of fluids daily especially while at work. Smokes 1ppd. Does not take any current medications.    Allergies  Allergen Reactions  . Antihistamines, Chlorpheniramine-Type    Past Medical History:  Diagnosis Date  . Recurrent boils of anus     Denies past surgical history.  Objective:   Vitals: BP 135/82 (BP Location: Right Arm)   Pulse 90   Temp 98.9 F (37.2 C) (Oral)   Resp 16   SpO2 98%   Physical Exam  Constitutional: He is oriented to person, place, and time. He appears well-developed and well-nourished.  Cardiovascular: Normal rate.  Pulmonary/Chest: Effort normal.  Musculoskeletal:       Lumbar back: He exhibits normal range of motion, no tenderness, no bony tenderness, no swelling, no edema, no deformity and no spasm.  Left leg: positive straight leg raise to the left.  Negative Homans sign.  No areas of erythema, swelling.  Neurological: He is alert and oriented to person, place, and time. He displays normal reflexes. Coordination (favors left leg, limps holding left low back/buttock) abnormal.  Skin: Skin is warm and dry.  Psychiatric: He has a normal mood and affect.   Assessment and Plan :   Left leg pain  Sciatica of left side  Left foot pain  Due to patient's severity of pain and desire to return to work quickly we will use a steroid course to try and address sciatica.  Counseled patient on back care, hydration.  He is to use Flexeril in addition to this.  Work note provided to return to work on Tuesday.   Wallis BambergMani, Baleigh Rennaker, [place] JerseyPA-C 05/17/18 1514

## 2018-05-17 NOTE — ED Triage Notes (Signed)
Pt reports left leg pain from his hip to his foot x1 month.  He reports swelling in his left foot with ambulation.  He states the pain will begin in his upper leg and radiate down his leg.

## 2018-05-17 NOTE — Discharge Instructions (Signed)
Bruce BambergMario Tobie Hellen, PA-C [facility] at [facility] [phone] [address], [place], [place] [phone]

## 2020-02-07 ENCOUNTER — Other Ambulatory Visit: Payer: Self-pay

## 2020-02-07 ENCOUNTER — Ambulatory Visit (HOSPITAL_COMMUNITY)
Admission: EM | Admit: 2020-02-07 | Discharge: 2020-02-07 | Disposition: A | Discharge status: Home / Self Care | Payer: Self-pay | Attending: Urgent Care | Admitting: Urgent Care

## 2020-02-07 DIAGNOSIS — K0889 Other specified disorders of teeth and supporting structures: Secondary | ICD-10-CM

## 2020-02-07 DIAGNOSIS — K047 Periapical abscess without sinus: Secondary | ICD-10-CM

## 2020-02-07 DIAGNOSIS — K5900 Constipation, unspecified: Secondary | ICD-10-CM

## 2020-02-07 DIAGNOSIS — R42 Dizziness and giddiness: Secondary | ICD-10-CM

## 2020-02-07 DIAGNOSIS — R110 Nausea: Secondary | ICD-10-CM

## 2020-02-07 MED ORDER — DOCUSATE SODIUM 100 MG PO CAPS: 100.0000 mg | ORAL_CAPSULE | Freq: Two times a day (BID) | ORAL | 0 refills | Status: DC

## 2020-02-07 MED ORDER — FLEET ENEMA 7-19 GM/118ML RE ENEM: 1.0000 | ENEMA | Freq: Every day | RECTAL | 0 refills | Status: DC | PRN

## 2020-02-07 MED ORDER — AMOXICILLIN 875 MG PO TABS: 875.0000 mg | ORAL_TABLET | Freq: Two times a day (BID) | ORAL | 0 refills | Status: DC

## 2020-02-07 NOTE — ED Triage Notes (Signed)
Pt c/o dizziness, nausea and constipation x 1 week. Took dulcolax with some relief

## 2020-02-07 NOTE — Discharge Instructions (Addendum)
Make sure you establish care with a new PCP through [facility] Internal medicine. You will need to get more labs and studies to make sure that this new worsening constipation is not related to your colon or your prostate.    For moderate to severe constipation (not having a bowel movement in more than 3 days) then try to use Miralax or an enema once daily until you have a good bowel movement.  It is not a good idea to use laxatives regularly, for instance daily.  A medication you could use daily to help with promoting bowel movements is docusate (Colace) 50mg -100mg . It is okay to use this 1-2 times daily as needed as a stool softener.  Try to stay active physically including regular exercise 2-3 times a week.  Make sure you hydrate well every day with about 64 ounces of water daily (that is 2 liters).  Try to avoid carb heavy foods, dairy. This includes cutting out breads, pasta, pizza, pastries, potatoes, rice, starchy foods in general. Eat more fiber as listed below:  Salads - kale, spinach, cabbage, spring mix; use seeds like pumpkin seeds or sunflower seeds, almonds; you can also use 1-2 hard boiled eggs in your salads Fruits - avocadoes, berries (blueberries, raspberries, blackberries), apples, oranges, pomegranate, grapefruit Vegetables - aspargus, cauliflower, broccoli, green beans, brussel spouts, bell peppers; stay away from starchy vegetables like potatoes, carrots, peas  Do not eat any foods on this list that you are allergic to.    [facility] [phone] extension [phone]  Dr. [phone] [address]  [place] [phone] [facility].  [facility] [phone] extension [phone], [place], [address], [place] First come first serve for the first 10 clients.  May do simple extractions only, no wisdom teeth or surgery.  You may try the second for Thursday of the month starting at 6:30 AM.  [facility] of Dentistry You may  call the school to see if they are still helping to provide dental care for emergent cases.

## 2020-02-07 NOTE — ED Provider Notes (Signed)
[facility]   MRN: [id number] DOB: 07-30-1971  Subjective:   Bruce Yang is a 49 y.o. male presenting for 1 week hx of constipation. He is passing gas. Has had trouble with constipation 1-2 times per year as long as he can remember. Has been using dulcolax without relief. This has previously worked for him. Has also had nausea without vomiting. Reports dental pain from 2 loose teeth. Denies hx of surgeries, abdominal or otherwise. Tries to eat healthily, eats fiber regularly. Has strayed from his normal healthy diet.   No current facility-administered medications for this encounter.  Current Outpatient Medications:  .  cyclobenzaprine (FLEXERIL) 5 MG tablet, Take 1 tablet (5 mg total) by mouth at bedtime as needed for muscle spasms., Disp: 30 tablet, Rfl: 0 .  predniSONE (DELTASONE) 20 MG tablet, Take 2 tablets daily with breakfast., Disp: 10 tablet, Rfl: 0   Allergies  Allergen Reactions  . Antihistamines, Chlorpheniramine-Type     Past Medical History:  Diagnosis Date  . Recurrent boils of anus      No past surgical history on file.  No family history on file.  Social History   Tobacco Use  . Smoking status: Current Every Day Smoker    Packs/day: 1.00    Types: Cigarettes  . Smokeless tobacco: Never Used  Substance Use Topics  . Alcohol use: No  . Drug use: No    ROS   Objective:   Vitals: BP 124/75   Pulse 81   Temp 99.4 F (37.4 C)   Resp 16   SpO2 98%   Physical Exam Constitutional:      General: He is not in acute distress.    Appearance: Normal appearance. He is well-developed and normal weight. He is not ill-appearing, toxic-appearing or diaphoretic.  HENT:     Head: Normocephalic and atraumatic.     Right Ear: External ear normal.     Left Ear: External ear normal.     Nose: Nose normal.     Mouth/Throat:     Dentition: Dental tenderness and dental caries present.     Pharynx: Oropharynx is clear.     Comments: Very poor  dentition with evidence of gingival erythema, gingival recession, multiple dental caries. Eyes:     General: No scleral icterus.       Right eye: No discharge.        Left eye: No discharge.     Extraocular Movements: Extraocular movements intact.     Pupils: Pupils are equal, round, and reactive to light.  Cardiovascular:     Rate and Rhythm: Normal rate and regular rhythm.     Heart sounds: No murmur. No friction rub. No gallop.   Pulmonary:     Effort: Pulmonary effort is normal. No respiratory distress.     Breath sounds: No wheezing or rales.  Abdominal:     General: Bowel sounds are normal. There is no distension.     Palpations: Abdomen is soft. There is no mass.     Tenderness: There is no abdominal tenderness. There is no right CVA tenderness, left CVA tenderness, guarding or rebound.  Musculoskeletal:     Cervical back: Normal range of motion.  Skin:    General: Skin is warm and dry.  Neurological:     Mental Status: He is alert and oriented to person, place, and time.  Psychiatric:        Mood and Affect: Mood normal.        Behavior:  Behavior normal.        Thought Content: Thought content normal.        Judgment: Judgment normal.      Assessment and Plan :   PDMP not reviewed this encounter.  1. Constipation, unspecified constipation type   2. Nausea without vomiting   3. Dizziness   4. Pain, dental   5. Dental infection     Patient on management of constipation which we will use an enema for.  He is to start docusate twice daily given his chronic constipation.  Discussed need for establishing care with a new PCP for further work-up to rule out new worsening constipation due to colon or prostate obstruction secondary to malignancy. Also patient with information to GI specialist.  Start amoxicillin for dental infection. Emphasized need for dental surgeon consult. Counseled patient on potential for adverse effects with medications prescribed/recommended today,  strict ER and return-to-clinic precautions discussed, patient verbalized understanding.    Jaynee Eagles, [place] 02/07/20 2500

## 2020-05-23 ENCOUNTER — Ambulatory Visit (HOSPITAL_COMMUNITY)
Admission: EM | Admit: 2020-05-23 | Discharge: 2020-05-23 | Disposition: A | Discharge status: Home / Self Care | Payer: Self-pay

## 2020-05-23 ENCOUNTER — Ambulatory Visit (HOSPITAL_COMMUNITY)
Admission: EM | Admit: 2020-05-23 | Discharge: 2020-05-23 | Disposition: A | Discharge status: Home / Self Care | Payer: Self-pay | Attending: Family Medicine | Admitting: Family Medicine

## 2020-05-23 ENCOUNTER — Other Ambulatory Visit: Payer: Self-pay

## 2020-05-23 ENCOUNTER — Encounter (HOSPITAL_COMMUNITY): Payer: Self-pay

## 2020-05-23 DIAGNOSIS — Z20822 Contact with and (suspected) exposure to covid-19: Secondary | ICD-10-CM | POA: Insufficient documentation

## 2020-05-23 DIAGNOSIS — R202 Paresthesia of skin: Secondary | ICD-10-CM | POA: Insufficient documentation

## 2020-05-23 DIAGNOSIS — R0789 Other chest pain: Secondary | ICD-10-CM | POA: Insufficient documentation

## 2020-05-23 DIAGNOSIS — R200 Anesthesia of skin: Secondary | ICD-10-CM | POA: Insufficient documentation

## 2020-05-23 DIAGNOSIS — R112 Nausea with vomiting, unspecified: Secondary | ICD-10-CM | POA: Insufficient documentation

## 2020-05-23 DIAGNOSIS — R1084 Generalized abdominal pain: Secondary | ICD-10-CM | POA: Insufficient documentation

## 2020-05-23 LAB — POCT URINALYSIS DIPSTICK, ED / UC
Bilirubin Urine: NEGATIVE
Glucose, UA: NEGATIVE mg/dL
Hgb urine dipstick: NEGATIVE
Ketones, ur: NEGATIVE mg/dL
Leukocytes,Ua: NEGATIVE
Nitrite: NEGATIVE
Protein, ur: 30 mg/dL — AB
Specific Gravity, Urine: >=1.03 (ref 1.005–1.030)
Urobilinogen, UA: 0.2 mg/dL (ref 0.0–1.0)
pH: 5.5 (ref 5.0–8.0)

## 2020-05-23 LAB — HEMOGLOBIN A1C
Hgb A1c MFr Bld: 6.4 % — ABNORMAL HIGH (ref 4.8–5.6)
Mean Plasma Glucose: 136.98 mg/dL

## 2020-05-23 LAB — CBG MONITORING, ED: Glucose-Capillary: 148 mg/dL — ABNORMAL HIGH (ref 70–99)

## 2020-05-23 LAB — COMPREHENSIVE METABOLIC PANEL
ALT: 47 U/L — ABNORMAL HIGH (ref 0–44)
AST: 27 U/L (ref 15–41)
Albumin: 4.3 g/dL (ref 3.5–5.0)
Alkaline Phosphatase: 107 U/L (ref 38–126)
Anion gap: 9 (ref 5–15)
BUN: 12 mg/dL (ref 6–20)
CO2: 27 mmol/L (ref 22–32)
Calcium: 9.8 mg/dL (ref 8.9–10.3)
Chloride: 102 mmol/L (ref 98–111)
Creatinine, Ser: 0.7 mg/dL (ref 0.61–1.24)
GFR calc Af Amer: >60 mL/min (ref >60)
GFR calc non Af Amer: >60 mL/min (ref >60)
Glucose, Bld: 154 mg/dL — ABNORMAL HIGH (ref 70–99)
Potassium: 4.6 mmol/L (ref 3.5–5.1)
Sodium: 138 mmol/L (ref 135–145)
Total Bilirubin: 0.7 mg/dL (ref 0.3–1.2)
Total Protein: 8.4 g/dL — ABNORMAL HIGH (ref 6.5–8.1)

## 2020-05-23 LAB — LIPASE, BLOOD: Lipase: 38 U/L (ref 11–51)

## 2020-05-23 LAB — SARS CORONAVIRUS 2 (TAT 6-24 HRS): SARS Coronavirus 2: NEGATIVE

## 2020-05-23 MED ORDER — LOPERAMIDE HCL 2 MG PO CAPS
2.0000 mg | ORAL_CAPSULE | Freq: Every day | ORAL | 0 refills | Status: DC | PRN
Start: 2020-05-23 — End: 2023-10-15

## 2020-05-23 MED ORDER — ONDANSETRON 8 MG PO TBDP: 8.0000 mg | ORAL_TABLET | Freq: Three times a day (TID) | ORAL | 0 refills | Status: DC | PRN

## 2020-05-23 MED ORDER — ONDANSETRON 4 MG PO TBDP
4.0000 mg | ORAL_TABLET | Freq: Once | ORAL | Status: AC
  Administered 2020-05-23: 4 mg via ORAL

## 2020-05-23 NOTE — ED Provider Notes (Signed)
[facility]   MRN: [id number] DOB: December 30, 1970  Subjective:   Bruce Yang is a 49 y.o. male presenting for acute onset of nausea with vomiting and diarrhea this morning, had stomach cramping as well.  Reports that his pain penetrated his abdomen to his back.  Has also had intermittent numbness and tingling of both hands and feet.  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  Did have an episode of transient left-sided chest pain that lasted a few seconds 2 days ago.  Patient states that he had just woken up and was laying in bed when symptoms came on.  They resolve very quickly on its own.  Patient is a smoker, has 2-3 beers a day.  Denies history of chronic conditions.  Does not get regular medical care.  Denies any active fever, chest pain, runny or stuffy nose, throat pain, cough, shortness of breath, bloody stools, hematemesis, headache, confusion, dizziness.  No current facility-administered medications for this encounter.  Current Outpatient Medications:  .  docusate sodium (COLACE) 100 MG capsule, Take 1 capsule (100 mg total) by mouth every 12 (twelve) hours., Disp: 60 capsule, Rfl: 0 .  ibuprofen (ADVIL) 600 MG tablet, Take 600 mg by mouth every 6 (six) hours as needed., Disp: , Rfl:  .  sodium phosphate (FLEET) 7-19 GM/118ML ENEM, Place 133 mLs (1 enema total) rectally daily as needed for severe constipation., Disp: 5 enema, Rfl: 0   Allergies  Allergen Reactions  . Antihistamines, Chlorpheniramine-Type     Past Medical History:  Diagnosis Date  . Recurrent boils of anus      History reviewed. No pertinent surgical history.  Family History  Family history unknown: Yes    Social History   Tobacco Use  . Smoking status: Current Every Day Smoker    Packs/day: 1.00    Types: Cigarettes  . Smokeless tobacco: Never Used  Substance Use Topics  . Alcohol use: No  . Drug use: No    ROS   Objective:   Vitals: BP 140/83 (BP  Location: Right Arm)   Pulse 91   Temp 98.7 F (37.1 C) (Oral)   Resp 18   SpO2 100%   Physical Exam Constitutional:      General: He is not in acute distress.    Appearance: Normal appearance. He is well-developed. He is not ill-appearing, toxic-appearing or diaphoretic.  HENT:     Head: Normocephalic and atraumatic.     Right Ear: External ear normal.     Left Ear: External ear normal.     Nose: Nose normal.     Mouth/Throat:     Mouth: Mucous membranes are moist.     Pharynx: Oropharynx is clear.  Eyes:     General: No scleral icterus.    Extraocular Movements: Extraocular movements intact.     Pupils: Pupils are equal, round, and reactive to light.  Cardiovascular:     Rate and Rhythm: Normal rate and regular rhythm.     Heart sounds: Normal heart sounds. No murmur heard.  No friction rub. No gallop.   Pulmonary:     Effort: Pulmonary effort is normal. No respiratory distress.     Breath sounds: Normal breath sounds. No stridor. No wheezing, rhonchi or rales.  Abdominal:     General: Bowel sounds are normal. There is no distension.     Palpations: Abdomen is soft. There is no mass.     Tenderness: There is generalized  abdominal tenderness and tenderness in the right lower quadrant, epigastric area, periumbilical area, suprapubic area and left lower quadrant. There is no right CVA tenderness, left CVA tenderness, guarding or rebound.  Skin:    General: Skin is warm and dry.  Neurological:     Mental Status: He is alert and oriented to person, place, and time.  Psychiatric:        Mood and Affect: Mood normal.        Behavior: Behavior normal.        Thought Content: Thought content normal.     ED ECG REPORT   Date: 05/23/2020  Rate: 89bpm  Rhythm: normal sinus rhythm  QRS Axis: normal  Intervals: normal  ST/T Wave abnormalities: nonspecific ST/T changes  Conduction Disutrbances:none  Narrative Interpretation: SInus rhythm at 89bpm with non-specific ST/T wave  changes in leads V5, V6. Has flattening T waves in I, aVL. No other ekg for comparison.   Old EKG Reviewed: none available  I have personally reviewed the EKG tracing and agree with the computerized printout as noted.  Results for orders placed or performed during the hospital encounter of 05/23/20 (from the past 24 hour(s))  POC CBG monitoring     Status: Abnormal   Collection Time: 05/23/20 10:17 AM  Result Value Ref Range   Glucose-Capillary 148 (H) 70 - 99 mg/dL  POC Urinalysis dipstick     Status: Abnormal   Collection Time: 05/23/20 10:28 AM  Result Value Ref Range   Glucose, UA NEGATIVE NEGATIVE mg/dL   Bilirubin Urine NEGATIVE NEGATIVE   Ketones, ur NEGATIVE NEGATIVE mg/dL   Specific Gravity, Urine >=1.030 1.005 - 1.030   Hgb urine dipstick NEGATIVE NEGATIVE   pH 5.5 5.0 - 8.0   Protein, ur 30 (A) NEGATIVE mg/dL   Urobilinogen, UA 0.2 0.0 - 1.0 mg/dL   Nitrite NEGATIVE NEGATIVE   Leukocytes,Ua NEGATIVE NEGATIVE     Assessment and Plan :   PDMP not reviewed this encounter.  1. Nausea and vomiting, intractability of vomiting not specified, unspecified vomiting type   2. Generalized abdominal pain   3. Exposure to COVID-19 virus   4. Atypical chest pain   5. Numbness and tingling in both hands   6. Numbness and tingling of both feet     Labs pending, will recommend supportive care for now for suspected gastroenteritis.  Reviewed appropriate use of loperamide and ondansetron.  Recommended he establish care with new PCP through [facility] internal medicine or with Dr. Neva Seat as he does not have a completely normal EKG and had a transient episode of chest pain.  COVID-19 testing pending.  Counseled patient on potential for adverse effects with medications prescribed/recommended today, ER and return-to-clinic precautions discussed, patient verbalized understanding.    Wallis Bamberg, PA-C 05/23/20 1048

## 2020-05-23 NOTE — Discharge Instructions (Addendum)
Make sure you push fluids drinking mostly water but mix it with Gatorade.  Try to eat light meals including soups, broths and soft foods, fruits.  You may use Zofran for your nausea and vomiting once every 8 hours.  Imodium can help with diarrhea but use this carefully limiting it to 1-2 times per day only if you are having a lot of diarrhea.  Please return to the clinic if symptoms worsen or you start having severe abdominal pain not helped by taking Tylenol or start having bloody stools or blood in the vomit.    For diabetes or elevated blood sugar, please make sure you are avoiding starchy, carbohydrate foods like pasta, breads, pastry, rice, potatoes, desserts. These foods can elevated your blood sugar. Also, avoid sodas, sweet teas, sugary beverages, fruit juices.  Drinking plain water will be much more helpful, try 64 ounces of water daily.  It is okay to flavor your water naturally by cutting cucumber, lemon, mint or lime, placing it in a picture with water and drinking it over a period of 2 to 3 days as long as it remains refrigerated.    For elevated blood pressure, make sure you are monitoring salt in your diet.  Do not eat restaurant foods and limit processed foods at home, prepare/cook your own foods at home.  Processed foods include things like frozen meals preseasoned meats and dinners, deli meats, canned foods as they are high in sodium/salt.  Make sure your pain attention to sodium labels on foods you by at the grocery store.  For seasoning you can use a brand called Mrs. Dash which includes a lot of salt free seasonings.  Salads - kale, spinach, cabbage, spring mix; use seeds like pumpkin seeds or sunflower seeds, almonds, walnuts or pecans; you can also use 1-2 hard boiled eggs in your salads Fruits - avocadoes, berries (blueberries, raspberries, blackberries), apples, oranges, pomegranate, pear; avoid eating bananas, grapes regularly Vegetables - aspargus, cauliflower, broccoli, green  beans, brussel spouts, bell peppers; stay away from starchy vegetables like potatoes, carrots, peas  Regarding meat it is better to eat lean meats and limit your red meat including pork to once a week.  Wild caught fish, chicken breast are good options as they tend to be leaner sources of good protein.   DO NOT EAT ANY FOODS ON THIS LIST THAT YOU ARE ALLERGIC TO.

## 2020-05-23 NOTE — ED Triage Notes (Addendum)
Pt c/o acute n/v/d, generalized abdominal pain onset this morning. Fatigue/malaise, subjective fever two days ago.  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   Pt reports left sided CP onset two days ago, tight and intermittent in nature. Denies CP at present. Denies radiating pain to left jaw/shoulder/arm, dizziness, slurred speech.  Denies sore throat, congestion, runny nose, cough, fever, chills, SOB.   Pt states that he was informed that a co-worker at work tested positive for COVID this week. EKG results given to Dr. Delton See.

## 2020-07-14 ENCOUNTER — Encounter: Payer: Self-pay | Admitting: Internal Medicine

## 2020-10-09 ENCOUNTER — Ambulatory Visit: Payer: Self-pay | Admitting: Family Medicine

## 2020-10-10 ENCOUNTER — Encounter: Payer: Self-pay | Admitting: Family Medicine

## 2022-04-23 ENCOUNTER — Emergency Department (HOSPITAL_COMMUNITY): Payer: Self-pay

## 2022-04-23 ENCOUNTER — Encounter (HOSPITAL_COMMUNITY): Payer: Self-pay | Admitting: Oncology

## 2022-04-23 ENCOUNTER — Other Ambulatory Visit: Payer: Self-pay

## 2022-04-23 ENCOUNTER — Emergency Department (HOSPITAL_COMMUNITY)
Admission: EM | Admit: 2022-04-23 | Discharge: 2022-04-23 | Disposition: A | Discharge status: Home / Self Care | Payer: Self-pay | Attending: Emergency Medicine | Admitting: Emergency Medicine

## 2022-04-23 DIAGNOSIS — M79601 Pain in right arm: Secondary | ICD-10-CM | POA: Insufficient documentation

## 2022-04-23 DIAGNOSIS — M79604 Pain in right leg: Secondary | ICD-10-CM | POA: Insufficient documentation

## 2022-04-23 DIAGNOSIS — M542 Cervicalgia: Secondary | ICD-10-CM | POA: Insufficient documentation

## 2022-04-23 DIAGNOSIS — R519 Headache, unspecified: Secondary | ICD-10-CM | POA: Insufficient documentation

## 2022-04-23 MED ORDER — LIDOCAINE 5 % EX PTCH: 1.0000 | MEDICATED_PATCH | CUTANEOUS | 0 refills | Status: DC

## 2022-04-23 MED ORDER — NAPROXEN 500 MG PO TABS: 500.0000 mg | ORAL_TABLET | Freq: Two times a day (BID) | ORAL | 0 refills | Status: DC

## 2022-04-23 MED ORDER — IBUPROFEN 800 MG PO TABS
800.0000 mg | ORAL_TABLET | Freq: Once | ORAL | Status: AC
  Administered 2022-04-23: 800 mg via ORAL
  Filled 2022-04-23: qty 1

## 2022-04-23 MED ORDER — METHOCARBAMOL 500 MG PO TABS: 500.0000 mg | ORAL_TABLET | Freq: Two times a day (BID) | ORAL | 0 refills | Status: DC
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
## 2022-04-23 NOTE — Discharge Instructions (Signed)
Return for new or worsening symptoms  Take the medications as prescribed
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
## 2022-10-07 DIAGNOSIS — Z419 Encounter for procedure for purposes other than remedying health state, unspecified: Secondary | ICD-10-CM | POA: Diagnosis not present

## 2022-11-07 DIAGNOSIS — Z419 Encounter for procedure for purposes other than remedying health state, unspecified: Secondary | ICD-10-CM | POA: Diagnosis not present

## 2022-12-06 DIAGNOSIS — Z419 Encounter for procedure for purposes other than remedying health state, unspecified: Secondary | ICD-10-CM | POA: Diagnosis not present

## 2023-01-06 DIAGNOSIS — Z419 Encounter for procedure for purposes other than remedying health state, unspecified: Secondary | ICD-10-CM | POA: Diagnosis not present

## 2023-01-07 ENCOUNTER — Emergency Department (HOSPITAL_COMMUNITY)
Admission: EM | Admit: 2023-01-07 | Discharge: 2023-01-07 | Disposition: A | Discharge status: Home / Self Care | Payer: Medicaid Other | Attending: Emergency Medicine | Admitting: Emergency Medicine

## 2023-01-07 ENCOUNTER — Encounter (HOSPITAL_COMMUNITY): Payer: Self-pay

## 2023-01-07 ENCOUNTER — Emergency Department (HOSPITAL_COMMUNITY): Payer: Medicaid Other

## 2023-01-07 DIAGNOSIS — G459 Transient cerebral ischemic attack, unspecified: Secondary | ICD-10-CM | POA: Diagnosis not present

## 2023-01-07 DIAGNOSIS — Z743 Need for continuous supervision: Secondary | ICD-10-CM | POA: Diagnosis not present

## 2023-01-07 DIAGNOSIS — R531 Weakness: Secondary | ICD-10-CM

## 2023-01-07 DIAGNOSIS — R4781 Slurred speech: Secondary | ICD-10-CM | POA: Diagnosis not present

## 2023-01-07 DIAGNOSIS — R42 Dizziness and giddiness: Secondary | ICD-10-CM | POA: Diagnosis not present

## 2023-01-07 DIAGNOSIS — R519 Headache, unspecified: Secondary | ICD-10-CM | POA: Diagnosis not present

## 2023-01-07 DIAGNOSIS — R791 Abnormal coagulation profile: Secondary | ICD-10-CM | POA: Insufficient documentation

## 2023-01-07 DIAGNOSIS — Z789 Other specified health status: Secondary | ICD-10-CM | POA: Diagnosis not present

## 2023-01-07 LAB — URINALYSIS, ROUTINE W REFLEX MICROSCOPIC
Bacteria, UA: NONE SEEN
Bilirubin Urine: NEGATIVE
Glucose, UA: NEGATIVE mg/dL
Ketones, ur: NEGATIVE mg/dL
Leukocytes,Ua: NEGATIVE
Nitrite: NEGATIVE
Protein, ur: NEGATIVE mg/dL
Specific Gravity, Urine: 1.011 (ref 1.005–1.030)
pH: 5 (ref 5.0–8.0)

## 2023-01-07 LAB — ETHANOL: Alcohol, Ethyl (B): <10 mg/dL (ref <10)

## 2023-01-07 LAB — COMPREHENSIVE METABOLIC PANEL
ALT: 18 U/L (ref 0–44)
AST: 23 U/L (ref 15–41)
Albumin: 3.3 g/dL — ABNORMAL LOW (ref 3.5–5.0)
Alkaline Phosphatase: 101 U/L (ref 38–126)
Anion gap: 10 (ref 5–15)
BUN: 13 mg/dL (ref 6–20)
CO2: 24 mmol/L (ref 22–32)
Calcium: 9.6 mg/dL (ref 8.9–10.3)
Chloride: 102 mmol/L (ref 98–111)
Creatinine, Ser: 0.76 mg/dL (ref 0.61–1.24)
GFR, Estimated: >60 mL/min (ref >60)
Glucose, Bld: 134 mg/dL — ABNORMAL HIGH (ref 70–99)
Potassium: 4.4 mmol/L (ref 3.5–5.1)
Sodium: 136 mmol/L (ref 135–145)
Total Bilirubin: 0.4 mg/dL (ref 0.3–1.2)
Total Protein: 7.4 g/dL (ref 6.5–8.1)

## 2023-01-07 LAB — DIFFERENTIAL
Abs Immature Granulocytes: 0.01 10*3/uL (ref 0.00–0.07)
Basophils Absolute: 0 10*3/uL (ref 0.0–0.1)
Basophils Relative: 0 %
Eosinophils Absolute: 0.1 10*3/uL (ref 0.0–0.5)
Eosinophils Relative: 2 %
Immature Granulocytes: 0 %
Lymphocytes Relative: 34 %
Lymphs Abs: 1.3 10*3/uL (ref 0.7–4.0)
Monocytes Absolute: 0.5 10*3/uL (ref 0.1–1.0)
Monocytes Relative: 12 %
Neutro Abs: 2 10*3/uL (ref 1.7–7.7)
Neutrophils Relative %: 52 %

## 2023-01-07 LAB — PROTIME-INR
INR: 1 (ref 0.8–1.2)
Prothrombin Time: 12.6 seconds (ref 11.4–15.2)

## 2023-01-07 LAB — RAPID URINE DRUG SCREEN, HOSP PERFORMED
Amphetamines: POSITIVE — AB
Barbiturates: NOT DETECTED
Benzodiazepines: NOT DETECTED
Cocaine: POSITIVE — AB
Opiates: NOT DETECTED
Tetrahydrocannabinol: NOT DETECTED

## 2023-01-07 LAB — CBC
HCT: 37.3 % — ABNORMAL LOW (ref 39.0–52.0)
Hemoglobin: 12.5 g/dL — ABNORMAL LOW (ref 13.0–17.0)
MCH: 31.5 pg (ref 26.0–34.0)
MCHC: 33.5 g/dL (ref 30.0–36.0)
MCV: 94 fL (ref 80.0–100.0)
Platelets: 224 10*3/uL (ref 150–400)
RBC: 3.97 MIL/uL — ABNORMAL LOW (ref 4.22–5.81)
RDW: 12.2 % (ref 11.5–15.5)
WBC: 3.8 10*3/uL — ABNORMAL LOW (ref 4.0–10.5)
nRBC: 0 % (ref 0.0–0.2)

## 2023-01-07 LAB — APTT: aPTT: 27 seconds (ref 24–36)

## 2023-01-07 LAB — CBG MONITORING, ED: Glucose-Capillary: 140 mg/dL — ABNORMAL HIGH (ref 70–99)

## 2023-01-07 NOTE — ED Notes (Signed)
Gave pt water 

## 2023-01-07 NOTE — ED Notes (Signed)
Gave pt lunch pack
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
## 2023-01-07 NOTE — ED Triage Notes (Addendum)
Pt bib EMS coming from home. Pt reports dizziness and difficulty walking last night due to right leg weakness. Family reported some slurred speech LKN [id number] last night. Pt states he fell this morning around 1000 and had difficulty getting up. Pt has right arm weakness, right side facial droop with smile and slurred speech in triage.

## 2023-01-07 NOTE — Discharge Instructions (Addendum)
Please follow up with Neurologist. It would also be very beneficial to find a Primary Care Provider who can help you better manage your chronic symptoms through the use of things like Physical Therapy. Seek emergency care if symptoms worsen.

## 2023-01-07 NOTE — ED Notes (Signed)
PA at bedside to eval pt

## 2023-02-05 DIAGNOSIS — Z419 Encounter for procedure for purposes other than remedying health state, unspecified: Secondary | ICD-10-CM | POA: Diagnosis not present

## 2023-03-08 DIAGNOSIS — Z419 Encounter for procedure for purposes other than remedying health state, unspecified: Secondary | ICD-10-CM | POA: Diagnosis not present

## 2023-04-07 DIAGNOSIS — Z419 Encounter for procedure for purposes other than remedying health state, unspecified: Secondary | ICD-10-CM | POA: Diagnosis not present

## 2023-05-08 DIAGNOSIS — Z419 Encounter for procedure for purposes other than remedying health state, unspecified: Secondary | ICD-10-CM | POA: Diagnosis not present

## 2023-06-08 DIAGNOSIS — Z419 Encounter for procedure for purposes other than remedying health state, unspecified: Secondary | ICD-10-CM | POA: Diagnosis not present

## 2023-06-09 ENCOUNTER — Emergency Department (HOSPITAL_COMMUNITY)
Admission: EM | Admit: 2023-06-09 | Discharge: 2023-06-09 | Disposition: A | Discharge status: Home / Self Care | Payer: BLUE CROSS/BLUE SHIELD | Attending: Emergency Medicine | Admitting: Emergency Medicine

## 2023-06-09 ENCOUNTER — Encounter (HOSPITAL_COMMUNITY): Payer: Self-pay

## 2023-06-09 ENCOUNTER — Other Ambulatory Visit: Payer: Self-pay

## 2023-06-09 DIAGNOSIS — X58XXXA Exposure to other specified factors, initial encounter: Secondary | ICD-10-CM | POA: Diagnosis not present

## 2023-06-09 DIAGNOSIS — S39012A Strain of muscle, fascia and tendon of lower back, initial encounter: Secondary | ICD-10-CM | POA: Insufficient documentation

## 2023-06-09 DIAGNOSIS — M549 Dorsalgia, unspecified: Secondary | ICD-10-CM | POA: Diagnosis not present

## 2023-06-09 DIAGNOSIS — Z743 Need for continuous supervision: Secondary | ICD-10-CM | POA: Diagnosis not present

## 2023-06-09 DIAGNOSIS — M545 Low back pain, unspecified: Secondary | ICD-10-CM | POA: Diagnosis present

## 2023-06-09 MED ORDER — KETOROLAC TROMETHAMINE 15 MG/ML IJ SOLN
15.0000 mg | Freq: Once | INTRAMUSCULAR | Status: AC
  Administered 2023-06-09: 15 mg via INTRAMUSCULAR
  Filled 2023-06-09: qty 1

## 2023-06-09 MED ORDER — ACETAMINOPHEN 500 MG PO TABS: 500.0000 mg | ORAL_TABLET | Freq: Four times a day (QID) | ORAL | 0 refills | Status: DC | PRN

## 2023-06-09 MED ORDER — IBUPROFEN 800 MG PO TABS: 800.0000 mg | ORAL_TABLET | Freq: Four times a day (QID) | ORAL | 0 refills | Status: DC | PRN

## 2023-06-09 MED ORDER — ACETAMINOPHEN 325 MG PO TABS
650.0000 mg | ORAL_TABLET | Freq: Once | ORAL | Status: AC
  Administered 2023-06-09: 650 mg via ORAL
  Filled 2023-06-09: qty 2

## 2023-06-09 NOTE — ED Provider Notes (Signed)
EMERGENCY DEPARTMENT AT [facility] Provider Note  CSN: [id number] Arrival date & time: 06/09/23 1005  Chief Complaint(s) Back Pain  HPI Bruce Yang is a 52 y.o. male without significant past medical history presenting with back pain.  He reports that he has had lower back pain for months after having injury.  Reports it is in his right low back and radiates down his right leg.  This is similar to previous episodes.  Denies any new trauma or falls.  He took a muscle relaxer at home but that did not help with his pain.  Did not take anything else for symptoms.  No bowel or bladder incontinence, numbness or tingling.  No history of IV drug use.  No fevers or chills.  No urinary symptoms.   Past Medical History Past Medical History:  Diagnosis Date   Recurrent boils of anus    There are no problems to display for this patient.  Home Medication(s) Prior to Admission medications   Medication Sig Start Date End Date Taking? Authorizing Provider  acetaminophen (TYLENOL) 500 MG tablet Take 1 tablet (500 mg total) by mouth every 6 (six) hours as needed. 06/09/23  Yes Lonell Grandchild, MD  docusate sodium (COLACE) 100 MG capsule Take 1 capsule (100 mg total) by mouth every 12 (twelve) hours. 02/07/20   Wallis Bamberg, PA-C  ibuprofen (ADVIL) 800 MG tablet Take 1 tablet (800 mg total) by mouth every 6 (six) hours as needed. 06/09/23   Lonell Grandchild, MD  lidocaine (LIDODERM) 5 % Place 1 patch onto the skin daily. Remove & Discard patch within 12 hours or as directed by MD 04/23/22   Henderly, Britni A, PA-C  loperamide (IMODIUM) 2 MG capsule Take 1 capsule (2 mg total) by mouth daily as needed for diarrhea or loose stools. 05/23/20   Wallis Bamberg, PA-C  methocarbamol (ROBAXIN) 500 MG tablet Take 1 tablet (500 mg total) by mouth 2 (two) times daily. 04/23/22   Henderly, Britni A, PA-C  naproxen (NAPROSYN) 500 MG tablet Take 1 tablet (500 mg total) by mouth 2 (two) times daily.  04/23/22   Henderly, Britni A, PA-C  ondansetron (ZOFRAN-ODT) 8 MG disintegrating tablet Take 1 tablet (8 mg total) by mouth every 8 (eight) hours as needed for nausea or vomiting. 05/23/20   Wallis Bamberg, PA-C  sodium phosphate (FLEET) 7-19 GM/118ML ENEM Place 133 mLs (1 enema total) rectally daily as needed for severe constipation. 02/07/20   Wallis Bamberg, PA-C                                                                                                                                    Past Surgical History History reviewed. No pertinent surgical history. Family History Family History  Family history unknown: Yes    Social History Social History   Tobacco Use   Smoking status: Every Day    Current packs/day: 1.00  Types: Cigarettes   Smokeless tobacco: Never  Substance Use Topics   Alcohol use: No   Drug use: No   Allergies Antihistamines, chlorpheniramine-type  Review of Systems Review of Systems  All other systems reviewed and are negative.   Physical Exam Vital Signs  I have reviewed the triage vital signs BP (!) 147/55   Pulse 76   Temp 98 F (36.7 C) (Oral)   Resp 18   Ht 5\' 6"  (1.676 m)   Wt 63.5 kg   SpO2 100%   BMI 22.60 kg/m  Physical Exam Vitals and nursing note reviewed.  Constitutional:      General: He is not in acute distress.    Appearance: Normal appearance.  HENT:     Mouth/Throat:     Mouth: Mucous membranes are moist.  Eyes:     Conjunctiva/sclera: Conjunctivae normal.  Cardiovascular:     Rate and Rhythm: Normal rate and regular rhythm.  Pulmonary:     Effort: Pulmonary effort is normal. No respiratory distress.     Breath sounds: Normal breath sounds.  Abdominal:     General: Abdomen is flat.     Palpations: Abdomen is soft.     Tenderness: There is no abdominal tenderness.  Musculoskeletal:     Right lower leg: No edema.     Left lower leg: No edema.     Comments: Mild right-sided paraspinal lumbar tenderness.  No midline  tenderness.  Skin:    General: Skin is warm and dry.     Capillary Refill: Capillary refill takes less than 2 seconds.  Neurological:     Mental Status: He is alert and oriented to person, place, and time. Mental status is at baseline.     Comments: Strength 5 out of 5 in the bilateral lower extremities with 2+ patellar reflexes bilaterally.  Psychiatric:        Mood and Affect: Mood normal.        Behavior: Behavior normal.     ED Results and Treatments Labs (all labs ordered are listed, but only abnormal results are displayed) Labs Reviewed - No data to display                                                                                                                        Radiology No results found.  Pertinent labs & imaging results that were available during my care of the patient were reviewed by me and considered in my medical decision making (see MDM for details).  Medications Ordered in ED Medications  acetaminophen (TYLENOL) tablet 650 mg (650 mg Oral Given 06/09/23 1128)  ketorolac (TORADOL) 15 MG/ML injection 15 mg (15 mg Intramuscular Given 06/09/23 1220)  Procedures Procedures  (including critical care time)  Medical Decision Making / ED Course   MDM:  52 year old male presenting to the emergency department with back pain.    Patient reports this is similar to chronic back pain but worse.  He did not take anything for symptoms at home.  He received Tylenol in triage and actually reports he feels much better.  He request a prescription for Tylenol and Motrin.  Will prescribe these with the patient.  Suspect chronic lumbar strain.  Discussed back exercises with the patient.  Examination overall reassuring, very low concern for any acute dangerous process such as spinal cord compression, occult infectious process, cauda equina  syndrome.      Medicines ordered and prescription drug management: Meds ordered this encounter  Medications   acetaminophen (TYLENOL) tablet 650 mg   ketorolac (TORADOL) 15 MG/ML injection 15 mg   ibuprofen (ADVIL) 800 MG tablet    Sig: Take 1 tablet (800 mg total) by mouth every 6 (six) hours as needed.    Dispense:  30 tablet    Refill:  0   acetaminophen (TYLENOL) 500 MG tablet    Sig: Take 1 tablet (500 mg total) by mouth every 6 (six) hours as needed.    Dispense:  30 tablet    Refill:  0    -I have reviewed the patients home medicines and have made adjustments as needed   Reevaluation: After the interventions noted above, I reevaluated the patient and found that their symptoms have improved  Co morbidities that complicate the patient evaluation  Past Medical History:  Diagnosis Date   Recurrent boils of anus       Dispostion: Disposition decision including need for hospitalization was considered, and patient discharged from emergency department.    Final Clinical Impression(s) / ED Diagnoses Final diagnoses:  Strain of lumbar region, initial encounter     This chart was dictated using voice recognition software.  Despite best efforts to proofread,  errors can occur which can change the documentation meaning.    Lonell Grandchild, MD 06/09/23 1225

## 2023-06-09 NOTE — Discharge Instructions (Signed)
We evaluated you for your back pain.  Your examination was reassuring.  Please take Tylenol and Motrin for your symptoms at home.  You can take 1000 mg of Tylenol every 6 hours and 800 mg of ibuprofen every 6 hours as needed for your symptoms.  You can take these medicines together as needed, either at the same time, or alternating every 3 hours.  I have prescribed these medications to your pharmacy.  You can also try other remedies like heat packs, ice packs or over-the-counter lidocaine patches.  You can buy the lidocaine patches at the pharmacy.  I have also attached some back exercises which you can do at home to help strengthen your back muscles and improve your symptoms.  Please follow-up with your primary doctor.  If you have any new or worsening symptoms such as inability to walk, incontinence, weakness in your legs, fevers or chills, or any other new symptoms, please return to the emergency department.

## 2023-06-09 NOTE — ED Triage Notes (Addendum)
Patient began having lower back since yesterday that moves down his right leg. No falls. Patient requesting sandwich.

## 2023-06-25 ENCOUNTER — Ambulatory Visit: Payer: Medicaid Other | Admitting: Family Medicine

## 2023-06-25 NOTE — Progress Notes (Deleted)
New Patient Office Visit  Subjective    Patient ID: Bruce Yang, male    DOB: 17-Oct-1970  Age: 52 y.o. MRN: [id number]  CC: No chief complaint on file.   HPI Bruce Yang presents to establish care  Cocaine positive, A1c 6.4,   PMH: ***  PSH: ***  FH: ***  Tobacco use: *** Alcohol use: *** Drug use: *** Marital status: *** Employment: *** Sexual hx: ***  Screenings:  Colon Cancer: *** Lung Cancer: *** Breast Cancer: *** Diabetes: *** HLD: ***   Outpatient Encounter Medications as of 06/25/2023  Medication Sig   acetaminophen (TYLENOL) 500 MG tablet Take 1 tablet (500 mg total) by mouth every 6 (six) hours as needed.   docusate sodium (COLACE) 100 MG capsule Take 1 capsule (100 mg total) by mouth every 12 (twelve) hours.   ibuprofen (ADVIL) 800 MG tablet Take 1 tablet (800 mg total) by mouth every 6 (six) hours as needed.   lidocaine (LIDODERM) 5 % Place 1 patch onto the skin daily. Remove & Discard patch within 12 hours or as directed by MD   loperamide (IMODIUM) 2 MG capsule Take 1 capsule (2 mg total) by mouth daily as needed for diarrhea or loose stools.   methocarbamol (ROBAXIN) 500 MG tablet Take 1 tablet (500 mg total) by mouth 2 (two) times daily.   naproxen (NAPROSYN) 500 MG tablet Take 1 tablet (500 mg total) by mouth 2 (two) times daily.   ondansetron (ZOFRAN-ODT) 8 MG disintegrating tablet Take 1 tablet (8 mg total) by mouth every 8 (eight) hours as needed for nausea or vomiting.   sodium phosphate (FLEET) 7-19 GM/118ML ENEM Place 133 mLs (1 enema total) rectally daily as needed for severe constipation.   No facility-administered encounter medications on file as of 06/25/2023.    Past Medical History:  Diagnosis Date   Recurrent boils of anus     No past surgical history on file.  Family History  Family history unknown: Yes    Social History   Socioeconomic History   Marital status: Married    Spouse name: Not on file   Number of  children: Not on file   Years of education: Not on file   Highest education level: Not on file  Occupational History   Not on file  Tobacco Use   Smoking status: Every Day    Current packs/day: 1.00    Types: Cigarettes   Smokeless tobacco: Never  Substance and Sexual Activity   Alcohol use: No   Drug use: No   Sexual activity: Not on file  Other Topics Concern   Not on file  Social History Narrative   Not on file   Social Determinants of Health   Financial Resource Strain: Not on file  Food Insecurity: Not on file  Transportation Needs: Not on file  Physical Activity: Not on file  Stress: Not on file  Social Connections: Not on file  Intimate Partner Violence: Not on file    ROS      Objective    There were no vitals taken for this visit.  Physical Exam     Assessment & Plan:   There are no diagnoses linked to this encounter.  No follow-ups on file.   Sandre Kitty, MD

## 2023-07-08 DIAGNOSIS — Z419 Encounter for procedure for purposes other than remedying health state, unspecified: Secondary | ICD-10-CM | POA: Diagnosis not present

## 2023-08-08 DIAGNOSIS — Z419 Encounter for procedure for purposes other than remedying health state, unspecified: Secondary | ICD-10-CM | POA: Diagnosis not present

## 2023-08-19 ENCOUNTER — Encounter: Payer: Self-pay | Admitting: Family Medicine

## 2023-08-19 ENCOUNTER — Ambulatory Visit (INDEPENDENT_AMBULATORY_CARE_PROVIDER_SITE_OTHER): Payer: BLUE CROSS/BLUE SHIELD | Admitting: Family Medicine

## 2023-08-19 VITALS — BP 138/86 | HR 90 | Temp 99.0°F | Ht 66.0 in | Wt 159.0 lb

## 2023-08-19 DIAGNOSIS — G8929 Other chronic pain: Secondary | ICD-10-CM | POA: Insufficient documentation

## 2023-08-19 DIAGNOSIS — R202 Paresthesia of skin: Secondary | ICD-10-CM | POA: Diagnosis not present

## 2023-08-19 DIAGNOSIS — Z1159 Encounter for screening for other viral diseases: Secondary | ICD-10-CM

## 2023-08-19 DIAGNOSIS — M545 Low back pain, unspecified: Secondary | ICD-10-CM

## 2023-08-19 DIAGNOSIS — Z13220 Encounter for screening for lipoid disorders: Secondary | ICD-10-CM | POA: Diagnosis not present

## 2023-08-19 DIAGNOSIS — G629 Polyneuropathy, unspecified: Secondary | ICD-10-CM | POA: Insufficient documentation

## 2023-08-19 DIAGNOSIS — Z1211 Encounter for screening for malignant neoplasm of colon: Secondary | ICD-10-CM

## 2023-08-19 DIAGNOSIS — F17200 Nicotine dependence, unspecified, uncomplicated: Secondary | ICD-10-CM

## 2023-08-19 DIAGNOSIS — F1290 Cannabis use, unspecified, uncomplicated: Secondary | ICD-10-CM | POA: Insufficient documentation

## 2023-08-19 DIAGNOSIS — R030 Elevated blood-pressure reading, without diagnosis of hypertension: Secondary | ICD-10-CM | POA: Diagnosis not present

## 2023-08-19 DIAGNOSIS — Z125 Encounter for screening for malignant neoplasm of prostate: Secondary | ICD-10-CM | POA: Diagnosis not present

## 2023-08-19 DIAGNOSIS — R739 Hyperglycemia, unspecified: Secondary | ICD-10-CM

## 2023-08-19 DIAGNOSIS — F1291 Cannabis use, unspecified, in remission: Secondary | ICD-10-CM | POA: Insufficient documentation

## 2023-08-19 MED ORDER — LIDOCAINE 5 % EX PTCH
1.0000 | MEDICATED_PATCH | CUTANEOUS | 3 refills | Status: DC
Start: 2023-08-19 — End: 2023-10-15
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
## 2023-08-19 NOTE — Assessment & Plan Note (Signed)
Prior blood sugars have shown significant hyperglycemia. I will plan to check a glucose and A1c to assess for diabetes. He did have a random sugar > 200 in 2017.
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
## 2023-08-19 NOTE — Assessment & Plan Note (Signed)
Strongly advise smoking cessation . We discussed an approach to using nicotine patches, starting with a 14 mg/day patch and using nicotine lozenges for cravings.  I spent 5 minutes counseling the patient about tobacco cessation.

## 2023-08-19 NOTE — Assessment & Plan Note (Signed)
BP is elevated today. We will monitor this for now. I suspect her may have chronic hypertension, exacerbated by nicotine, alcohol, and ibuprofen use. If this remains elevated at his next visit, I will plan to start medication for this.

## 2023-08-19 NOTE — Assessment & Plan Note (Signed)
Recommend he stop marijuana use due to increased risk for CAD and stroke.

## 2023-08-19 NOTE — Assessment & Plan Note (Signed)
I will check labs for common etiologies of neuropathies. His alcohol use may contribute to this. I will also screen for diabetes.

## 2023-08-20 ENCOUNTER — Encounter: Payer: Self-pay | Admitting: Family Medicine

## 2023-08-20 DIAGNOSIS — R7303 Prediabetes: Secondary | ICD-10-CM | POA: Insufficient documentation

## 2023-08-20 DIAGNOSIS — E785 Hyperlipidemia, unspecified: Secondary | ICD-10-CM | POA: Insufficient documentation

## 2023-08-20 LAB — VITAMIN B12: Vitamin B-12: 374 pg/mL (ref 211–911)

## 2023-08-20 LAB — COMPREHENSIVE METABOLIC PANEL
ALT: 9 U/L (ref 0–53)
AST: 15 U/L (ref 0–37)
Albumin: 4 g/dL (ref 3.5–5.2)
Alkaline Phosphatase: 107 U/L (ref 39–117)
BUN: 14 mg/dL (ref 6–23)
CO2: 29 meq/L (ref 19–32)
Calcium: 9.3 mg/dL (ref 8.4–10.5)
Chloride: 102 meq/L (ref 96–112)
Creatinine, Ser: 0.8 mg/dL (ref 0.40–1.50)
GFR: 100.61 mL/min (ref >60.00)
Glucose, Bld: 196 mg/dL — ABNORMAL HIGH (ref 70–99)
Potassium: 4 meq/L (ref 3.5–5.1)
Sodium: 138 meq/L (ref 135–145)
Total Bilirubin: 0.4 mg/dL (ref 0.2–1.2)
Total Protein: 7.6 g/dL (ref 6.0–8.3)

## 2023-08-20 LAB — TSH: TSH: 0.88 u[IU]/mL (ref 0.35–5.50)

## 2023-08-20 LAB — CBC
HCT: 36.3 % — ABNORMAL LOW (ref 39.0–52.0)
Hemoglobin: 12.2 g/dL — ABNORMAL LOW (ref 13.0–17.0)
MCHC: 33.6 g/dL (ref 30.0–36.0)
MCV: 92.7 fL (ref 78.0–100.0)
Platelets: 289 10*3/uL (ref 150.0–400.0)
RBC: 3.92 Mil/uL — ABNORMAL LOW (ref 4.22–5.81)
RDW: 13.5 % (ref 11.5–15.5)
WBC: 3.4 10*3/uL — ABNORMAL LOW (ref 4.0–10.5)

## 2023-08-20 LAB — LIPID PANEL
Cholesterol: 257 mg/dL — ABNORMAL HIGH (ref 0–200)
HDL: 53.5 mg/dL (ref >39.00)
LDL Cholesterol: 173 mg/dL — ABNORMAL HIGH (ref 0–99)
NonHDL: 203.45
Total CHOL/HDL Ratio: 5
Triglycerides: 154 mg/dL — ABNORMAL HIGH (ref 0.0–149.0)
VLDL: 30.8 mg/dL (ref 0.0–40.0)

## 2023-08-20 LAB — HCV INTERPRETATION

## 2023-08-20 LAB — HCV AB W REFLEX TO QUANT PCR: HCV Ab: NONREACTIVE

## 2023-08-20 LAB — PSA: PSA: 1.54 ng/mL (ref 0.10–4.00)

## 2023-08-20 LAB — HEMOGLOBIN A1C: Hgb A1c MFr Bld: 6.3 % (ref 4.6–6.5)

## 2023-08-21 ENCOUNTER — Telehealth: Payer: Self-pay

## 2023-08-21 ENCOUNTER — Other Ambulatory Visit (HOSPITAL_COMMUNITY): Payer: Self-pay

## 2023-08-21 NOTE — Telephone Encounter (Signed)
Pharmacy Patient Advocate Encounter   Received notification from CoverMyMeds that prior authorization for Lidocaine 5% patches is required/requested.   Insurance verification completed.   The patient is insured through [facility] .   Per test claim: PA required; PA started via CoverMyMeds. KEY [id number] . Waiting for clinical questions to populate.

## 2023-08-22 NOTE — Telephone Encounter (Signed)
Pharmacy Patient Advocate Encounter  Questions generated, answered, and submitted

## 2023-08-25 NOTE — Telephone Encounter (Signed)
Pharmacy Patient Advocate Encounter  Received notification from [facility] that Prior Authorization for Lidocaine 5% patches has been DENIED.  Full denial letter will be uploaded to the media tab. See denial reason below.   PA #/Case ID/Reference #: [id number]

## 2023-09-07 DIAGNOSIS — Z419 Encounter for procedure for purposes other than remedying health state, unspecified: Secondary | ICD-10-CM | POA: Diagnosis not present

## 2023-09-25 ENCOUNTER — Ambulatory Visit (INDEPENDENT_AMBULATORY_CARE_PROVIDER_SITE_OTHER): Payer: BLUE CROSS/BLUE SHIELD | Admitting: Physician Assistant

## 2023-09-25 ENCOUNTER — Encounter: Payer: Self-pay | Admitting: Physician Assistant

## 2023-09-25 ENCOUNTER — Other Ambulatory Visit: Payer: Self-pay

## 2023-09-25 DIAGNOSIS — M545 Low back pain, unspecified: Secondary | ICD-10-CM | POA: Diagnosis not present

## 2023-09-25 DIAGNOSIS — G8929 Other chronic pain: Secondary | ICD-10-CM | POA: Diagnosis not present

## 2023-09-25 DIAGNOSIS — M4317 Spondylolisthesis, lumbosacral region: Secondary | ICD-10-CM | POA: Diagnosis not present

## 2023-09-25 MED ORDER — MELOXICAM 15 MG PO TABS
15.0000 mg | ORAL_TABLET | Freq: Every day | ORAL | 0 refills | Status: DC
Start: 2023-09-25 — End: 2023-10-15
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
## 2023-10-02 ENCOUNTER — Ambulatory Visit: Payer: Medicaid Other | Admitting: Family Medicine

## 2023-10-02 ENCOUNTER — Telehealth: Payer: Self-pay

## 2023-10-02 NOTE — Telephone Encounter (Signed)
Pt's girlfriend Bruce Yang called to report that since pt started the Mobic rx he was given has been depressed, not feeling himself and had asked her to call to let the office know. Upon further discussion it turn out that the pt only took the medication for 2 days and had stopped taking on Saturday. She advised that the pt "has not been drinking enough water" and he has been wanting to eat "sweets" and has just been advised that he is pre diabetic. She said that the pt did not want to go to the ER and thinks that this is all in relation to the Mobic he took for 2 days and that it needs to "flush out of his system" advised that he should continue to hold and the medication that I would send message to PA and see if she had any suggestions. To make sure he is drinking fluids and to avoid sugary sweets. Was not sure if the medication had caused his symptoms after taking for 2 days but that I would relay the information and provider. Cb [phone]

## 2023-10-08 DIAGNOSIS — Z419 Encounter for procedure for purposes other than remedying health state, unspecified: Secondary | ICD-10-CM | POA: Diagnosis not present

## 2023-10-13 ENCOUNTER — Other Ambulatory Visit (INDEPENDENT_AMBULATORY_CARE_PROVIDER_SITE_OTHER): Payer: BLUE CROSS/BLUE SHIELD

## 2023-10-13 ENCOUNTER — Emergency Department (HOSPITAL_COMMUNITY): Payer: BLUE CROSS/BLUE SHIELD

## 2023-10-13 ENCOUNTER — Observation Stay (HOSPITAL_COMMUNITY)
Admission: EM | Admit: 2023-10-13 | Discharge: 2023-10-15 | Disposition: A | Discharge status: Home / Self Care | Payer: BLUE CROSS/BLUE SHIELD | Attending: Internal Medicine | Admitting: Internal Medicine

## 2023-10-13 ENCOUNTER — Other Ambulatory Visit: Payer: Self-pay

## 2023-10-13 ENCOUNTER — Ambulatory Visit (INDEPENDENT_AMBULATORY_CARE_PROVIDER_SITE_OTHER): Payer: BLUE CROSS/BLUE SHIELD | Admitting: Orthopedic Surgery

## 2023-10-13 VITALS — BP 196/108 | HR 89 | Ht 66.0 in | Wt 159.0 lb

## 2023-10-13 DIAGNOSIS — Z789 Other specified health status: Secondary | ICD-10-CM

## 2023-10-13 DIAGNOSIS — F17210 Nicotine dependence, cigarettes, uncomplicated: Secondary | ICD-10-CM | POA: Insufficient documentation

## 2023-10-13 DIAGNOSIS — E871 Hypo-osmolality and hyponatremia: Secondary | ICD-10-CM

## 2023-10-13 DIAGNOSIS — R739 Hyperglycemia, unspecified: Secondary | ICD-10-CM | POA: Diagnosis present

## 2023-10-13 DIAGNOSIS — E876 Hypokalemia: Secondary | ICD-10-CM | POA: Diagnosis not present

## 2023-10-13 DIAGNOSIS — M545 Low back pain, unspecified: Secondary | ICD-10-CM | POA: Diagnosis not present

## 2023-10-13 DIAGNOSIS — F1091 Alcohol use, unspecified, in remission: Secondary | ICD-10-CM

## 2023-10-13 DIAGNOSIS — F1090 Alcohol use, unspecified, uncomplicated: Secondary | ICD-10-CM | POA: Diagnosis not present

## 2023-10-13 DIAGNOSIS — Z79899 Other long term (current) drug therapy: Secondary | ICD-10-CM | POA: Diagnosis not present

## 2023-10-13 DIAGNOSIS — M4712 Other spondylosis with myelopathy, cervical region: Secondary | ICD-10-CM

## 2023-10-13 DIAGNOSIS — E1165 Type 2 diabetes mellitus with hyperglycemia: Secondary | ICD-10-CM | POA: Diagnosis not present

## 2023-10-13 DIAGNOSIS — I6381 Other cerebral infarction due to occlusion or stenosis of small artery: Secondary | ICD-10-CM | POA: Diagnosis not present

## 2023-10-13 DIAGNOSIS — I639 Cerebral infarction, unspecified: Principal | ICD-10-CM | POA: Diagnosis present

## 2023-10-13 DIAGNOSIS — B20 Human immunodeficiency virus [HIV] disease: Secondary | ICD-10-CM | POA: Diagnosis not present

## 2023-10-13 DIAGNOSIS — G8929 Other chronic pain: Secondary | ICD-10-CM

## 2023-10-13 DIAGNOSIS — F1911 Other psychoactive substance abuse, in remission: Secondary | ICD-10-CM

## 2023-10-13 DIAGNOSIS — R011 Cardiac murmur, unspecified: Secondary | ICD-10-CM

## 2023-10-13 DIAGNOSIS — R200 Anesthesia of skin: Secondary | ICD-10-CM | POA: Diagnosis present

## 2023-10-13 DIAGNOSIS — R7303 Prediabetes: Secondary | ICD-10-CM

## 2023-10-13 DIAGNOSIS — Z5181 Encounter for therapeutic drug level monitoring: Secondary | ICD-10-CM | POA: Diagnosis not present

## 2023-10-13 DIAGNOSIS — F1910 Other psychoactive substance abuse, uncomplicated: Secondary | ICD-10-CM

## 2023-10-13 LAB — CBC WITH DIFFERENTIAL/PLATELET
Abs Immature Granulocytes: 0.03 10*3/uL (ref 0.00–0.07)
Basophils Absolute: 0 10*3/uL (ref 0.0–0.1)
Basophils Relative: 0 %
Eosinophils Absolute: 0 10*3/uL (ref 0.0–0.5)
Eosinophils Relative: 0 %
HCT: 39.4 % (ref 39.0–52.0)
Hemoglobin: 13 g/dL (ref 13.0–17.0)
Immature Granulocytes: 1 %
Lymphocytes Relative: 26 %
Lymphs Abs: 1.3 10*3/uL (ref 0.7–4.0)
MCH: 30.3 pg (ref 26.0–34.0)
MCHC: 33 g/dL (ref 30.0–36.0)
MCV: 91.8 fL (ref 80.0–100.0)
Monocytes Absolute: 0.6 10*3/uL (ref 0.1–1.0)
Monocytes Relative: 11 %
Neutro Abs: 3.3 10*3/uL (ref 1.7–7.7)
Neutrophils Relative %: 62 %
Platelets: 257 10*3/uL (ref 150–400)
RBC: 4.29 MIL/uL (ref 4.22–5.81)
RDW: 11.9 % (ref 11.5–15.5)
WBC: 5.2 10*3/uL (ref 4.0–10.5)
nRBC: 0 % (ref 0.0–0.2)

## 2023-10-13 LAB — COMPREHENSIVE METABOLIC PANEL
ALT: 17 U/L (ref 0–44)
AST: 23 U/L (ref 15–41)
Albumin: 3.8 g/dL (ref 3.5–5.0)
Alkaline Phosphatase: 81 U/L (ref 38–126)
Anion gap: 13 (ref 5–15)
BUN: 12 mg/dL (ref 6–20)
CO2: 21 mmol/L — ABNORMAL LOW (ref 22–32)
Calcium: 9.5 mg/dL (ref 8.9–10.3)
Chloride: 100 mmol/L (ref 98–111)
Creatinine, Ser: 0.76 mg/dL (ref 0.61–1.24)
GFR, Estimated: >60 mL/min (ref >60)
Glucose, Bld: 312 mg/dL — ABNORMAL HIGH (ref 70–99)
Potassium: 3.4 mmol/L — ABNORMAL LOW (ref 3.5–5.1)
Sodium: 134 mmol/L — ABNORMAL LOW (ref 135–145)
Total Bilirubin: 0.5 mg/dL (ref 0.0–1.2)
Total Protein: 8.1 g/dL (ref 6.5–8.1)

## 2023-10-13 LAB — RAPID URINE DRUG SCREEN, HOSP PERFORMED
Amphetamines: POSITIVE — AB
Barbiturates: NOT DETECTED
Benzodiazepines: NOT DETECTED
Cocaine: NOT DETECTED
Opiates: NOT DETECTED
Tetrahydrocannabinol: POSITIVE — AB

## 2023-10-13 LAB — CBG MONITORING, ED: Glucose-Capillary: 299 mg/dL — ABNORMAL HIGH (ref 70–99)

## 2023-10-13 LAB — ETHANOL: Alcohol, Ethyl (B): 164 mg/dL — ABNORMAL HIGH (ref <10)

## 2023-10-13 MED ORDER — LORAZEPAM 1 MG PO TABS: 1.0000 mg | ORAL_TABLET | ORAL | Status: DC | PRN

## 2023-10-13 MED ORDER — ACETAMINOPHEN 650 MG RE SUPP: 650.0000 mg | Freq: Four times a day (QID) | RECTAL | Status: DC | PRN

## 2023-10-13 MED ORDER — LACTATED RINGERS IV BOLUS
1000.0000 mL | Freq: Once | INTRAVENOUS | Status: AC
  Administered 2023-10-13: 1000 mL via INTRAVENOUS

## 2023-10-13 MED ORDER — STROKE: EARLY STAGES OF RECOVERY BOOK
Freq: Once | Status: AC
Start: 2023-10-14 — End: 2023-10-14
  Filled 2023-10-13 (×2): qty 1

## 2023-10-13 MED ORDER — ENOXAPARIN SODIUM 40 MG/0.4ML IJ SOSY
40.0000 mg | PREFILLED_SYRINGE | INTRAMUSCULAR | Status: DC
  Administered 2023-10-14 (×2): 40 mg via SUBCUTANEOUS
  Filled 2023-10-13 (×2): qty 0.4

## 2023-10-13 MED ORDER — ACETAMINOPHEN 160 MG/5ML PO SOLN: 650.0000 mg | Freq: Four times a day (QID) | ORAL | Status: DC | PRN

## 2023-10-13 MED ORDER — INSULIN ASPART 100 UNIT/ML IJ SOLN
0.0000 [IU] | Freq: Three times a day (TID) | INTRAMUSCULAR | Status: DC
  Administered 2023-10-14 (×3): 2 [IU] via SUBCUTANEOUS
  Administered 2023-10-15: 3 [IU] via SUBCUTANEOUS
  Administered 2023-10-15 (×2): 2 [IU] via SUBCUTANEOUS
  Filled 2023-10-13: qty 0.09

## 2023-10-13 MED ORDER — THIAMINE HCL 100 MG/ML IJ SOLN: 100.0000 mg | Freq: Every day | INTRAMUSCULAR | Status: DC

## 2023-10-13 MED ORDER — THIAMINE MONONITRATE 100 MG PO TABS
100.0000 mg | ORAL_TABLET | Freq: Every day | ORAL | Status: DC
  Administered 2023-10-14 – 2023-10-15 (×2): 100 mg via ORAL
  Filled 2023-10-13 (×3): qty 1

## 2023-10-13 MED ORDER — ASPIRIN 81 MG PO TBEC
81.0000 mg | DELAYED_RELEASE_TABLET | Freq: Every day | ORAL | Status: DC
  Administered 2023-10-13 – 2023-10-15 (×3): 81 mg via ORAL
  Filled 2023-10-13 (×4): qty 1

## 2023-10-13 MED ORDER — ADULT MULTIVITAMIN W/MINERALS CH
1.0000 | ORAL_TABLET | Freq: Every day | ORAL | Status: DC
  Administered 2023-10-14 – 2023-10-15 (×2): 1 via ORAL
  Filled 2023-10-13 (×3): qty 1

## 2023-10-13 MED ORDER — LORAZEPAM 2 MG/ML IJ SOLN: 1.0000 mg | INTRAMUSCULAR | Status: DC | PRN

## 2023-10-13 MED ORDER — FOLIC ACID 1 MG PO TABS
1.0000 mg | ORAL_TABLET | Freq: Every day | ORAL | Status: DC
  Administered 2023-10-14 – 2023-10-15 (×2): 1 mg via ORAL
  Filled 2023-10-13 (×2): qty 1

## 2023-10-13 MED ORDER — ACETAMINOPHEN 325 MG PO TABS
650.0000 mg | ORAL_TABLET | Freq: Four times a day (QID) | ORAL | Status: DC | PRN
  Administered 2023-10-14 – 2023-10-15 (×3): 650 mg via ORAL
  Filled 2023-10-13 (×3): qty 2

## 2023-10-13 MED ORDER — INSULIN ASPART 100 UNIT/ML IJ SOLN
0.0000 [IU] | Freq: Every day | INTRAMUSCULAR | Status: DC
  Administered 2023-10-14: 3 [IU] via SUBCUTANEOUS
  Filled 2023-10-13: qty 0.05

## 2023-10-13 MED ORDER — POTASSIUM CHLORIDE CRYS ER 20 MEQ PO TBCR
40.0000 meq | EXTENDED_RELEASE_TABLET | Freq: Once | ORAL | Status: AC
  Administered 2023-10-14: 40 meq via ORAL
  Filled 2023-10-13: qty 2

## 2023-10-13 NOTE — ED Provider Triage Note (Signed)
 Emergency Medicine Provider Triage Evaluation Note  Dwon Sky , a 53 y.o. male  was evaluated in triage.  Pt complains of left arm numb.  Review of Systems  Positive: Whole arm numbness Negative: Weakness, pain, dysarthria,   Physical Exam  BP (!) 142/76 (BP Location: Left Arm)   Pulse 91   Temp (!) 97.2 F (36.2 C) (Oral)   Resp 18   SpO2 99%  Gen:   Awake, no distress  Acutely intoxicated Resp:  Normal effort  MSK:   Moves extremities without difficulty  Other:    Medical Decision Making  Medically screening exam initiated at 4:27 PM.  Appropriate orders placed.  Dezmen Alcock was informed that the remainder of the evaluation will be completed by another provider, this initial triage assessment does not replace that evaluation, and the importance of remaining in the ED until their evaluation is complete.  Patient with 20-30 minutes left arm numbness without weakness. No other symptoms.   Chart review: ortho notes from office visit earlier today referring to neck and back pain. Patient is pending MRI c-spine for further evaluation.   Code stroke held for now given subjective nature and likely more related to chronic neck process   Odell Balls, PA-C 10/13/23 1632
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
## 2023-10-13 NOTE — Progress Notes (Signed)
 1728-code stroke activated, pt has already been to CT scan, code stroke activated for left sided numbness that started 2 hours ago. Pt states he felt fine before hand.  1738-Dr. Lindzen joined tele-neuro cart for exam  1802-no TNK, pt states he has had sx for 3 days and worsened today. TSRN logged off cart at this time.
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
## 2023-10-13 NOTE — ED Provider Notes (Signed)
 [place] EMERGENCY DEPARTMENT AT [facility] Provider Note   CSN: [id number] Arrival date & time: 10/13/23  8386  An emergency department physician performed an initial assessment on this suspected stroke patient at 25.  History  Chief Complaint  Patient presents with   Numbness    Bruce Yang is a 53 y.o. male.  Patient is a 53 year old male with a history borderline blood sugars, regular alcohol use, tobacco abuse who is presenting today due to sudden onset of numbness in the left side of his face, arm and leg that has been progressive since 4 PM.  Patient does admit that he has drank quite a bit of alcohol today which is different than what he normally does.  He reports that he normally will have a beer or 2 a day but sometimes does not drink at all but today he had some wine, mixed drinks and liquor.  He did go to his orthopedist earlier today and had some x-rays of his neck and back due to more chronic pain issues.  However he was feeling fine when he left and most the time all of his pain is on the right side.  While he was changing over some tidal work with the car today he suddenly started feeling the above symptoms.  His wife is present at bedside and reports that he started complaining of numbness at 4 PM and she drove him directly to the hospital.  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  However on my evaluation at 520 patient now reports his left arm leg and face feeling numb.  No prior history of similar symptoms.  He denies any shortness of breath, fever.  He does report that for the last 2 days he has just been laying around because he did not have a lot of energy but has no other complaints.  The history is provided by the patient, the spouse and medical records.        Home Medications Prior to Admission medications   Medication Sig Start Date End Date Taking? Authorizing Provider  acetaminophen  (TYLENOL ) 500 MG tablet Take 1 tablet (500 mg total) by mouth every 6 (six) hours as needed. 06/09/23   Francesca Elsie CROME, MD  docusate sodium  (COLACE) 100 MG capsule Take 1 capsule (100 mg total) by mouth every 12 (twelve) hours. 02/07/20   Christopher Savannah, PA-C  ibuprofen  (ADVIL ) 800 MG tablet Take 1 tablet (800 mg total) by mouth every 6 (six) hours as needed. 06/09/23   Francesca Elsie CROME, MD  lidocaine  (LIDODERM ) 5 % Place 1 patch onto the skin daily. Remove & Discard patch within 12 hours or as directed by MD 08/19/23   [name], MD  loperamide  (IMODIUM ) 2 MG capsule Take 1 capsule (2 mg total) by mouth daily as needed for diarrhea or loose stools. 05/23/20   Christopher Savannah, PA-C  meloxicam  (MOBIC ) 15 MG tablet Take 1 tablet (15 mg total) by mouth daily. 09/25/23   Persons, Ronal Dragon, PA      Allergies    Antihistamines, chlorpheniramine-type    Review of Systems   Review of Systems  Physical Exam Updated Vital Signs BP (!) 142/76 (BP Location: Left Arm)   Pulse 91   Temp (!) 97.2 F (  36.2 C) (Oral)   Resp 18   SpO2 99%  Physical Exam Vitals and nursing note reviewed.  Constitutional:      General: He is not in acute distress.    Appearance: He is well-developed.  HENT:     Head: Normocephalic and atraumatic.  Eyes:     Conjunctiva/sclera: Conjunctivae normal.     Pupils: Pupils are equal, round, and reactive to light.  Cardiovascular:     Rate and Rhythm: Normal rate and regular rhythm.     Heart sounds: No murmur heard. Pulmonary:     Effort: Pulmonary effort is normal. No respiratory distress.     Breath sounds: Normal breath sounds. No wheezing or rales.  Abdominal:     General: There is no distension.     Palpations: Abdomen is soft.     Tenderness: There is no abdominal tenderness. There is no guarding or rebound.   Musculoskeletal:        General: No tenderness. Normal range of motion.     Cervical back: Normal range of motion and neck supple.     Comments: Weakness noted in the left upper extremity which appears to be chronic.  5 out of 5 grip strength in the right upper and lower extremity.  Decreased sensation subjectively in the right upper and lower extremity.  No notable facial droop or slurred speech.  Normal heel-to-shin with the left foot and normal finger-to-nose.  No notable visual field cuts  Skin:    General: Skin is warm and dry.     Findings: No erythema or rash.  Neurological:     Mental Status: He is alert and oriented to person, place, and time.  Psychiatric:        Mood and Affect: Mood normal.        Behavior: Behavior normal.     ED Results / Procedures / Treatments   Labs (all labs ordered are listed, but only abnormal results are displayed) Labs Reviewed  COMPREHENSIVE METABOLIC PANEL - Abnormal; Notable for the following components:      Result Value   Sodium 134 (*)    Potassium 3.4 (*)    CO2 21 (*)    Glucose, Bld 312 (*)    All other components within normal limits  ETHANOL - Abnormal; Notable for the following components:   Alcohol, Ethyl (B) 164 (*)    All other components within normal limits  RAPID URINE DRUG SCREEN, HOSP PERFORMED - Abnormal; Notable for the following components:   Amphetamines POSITIVE (*)    Tetrahydrocannabinol POSITIVE (*)    All other components within normal limits  CBG MONITORING, ED - Abnormal; Notable for the following components:   Glucose-Capillary 299 (*)    All other components within normal limits  CBC WITH DIFFERENTIAL/PLATELET    EKG EKG Interpretation Date/Time:  Monday October 13 2023 16:25:43 EST Ventricular Rate:  89 PR Interval:  142 QRS Duration:  93 QT Interval:  359 QTC Calculation: 437 R Axis:   83  Text Interpretation: Sinus rhythm Repol abnrm suggests ischemia, diffuse leads ST elevation, consider  anterolateral injury Baseline wander in lead(s) V6 No significant change since last tracing Confirmed by Doretha Folks ([id number]) on 10/13/2023 4:55:47 PM  Radiology CT Head Wo Contrast Result Date: 10/13/2023 CLINICAL DATA:  Numbness in the left arm beginning today. EXAM: CT HEAD WITHOUT CONTRAST TECHNIQUE: Contiguous axial images were obtained from the base of the skull through the vertex without intravenous contrast. RADIATION DOSE REDUCTION: This  exam was performed according to the departmental dose-optimization program which includes automated exposure control, adjustment of the mA and/or kV according to patient size and/or use of iterative reconstruction technique. COMPARISON:  01/07/2023 FINDINGS: Brain: No focal abnormality affects the brainstem or cerebellum. New low-density seen within the anterior right thalamus consistent with an infarction. This is age indeterminate. On the left, there are old appearing infarctions in the region of the lateral thalamus/radiating white matter tracts. None of these findings were present in a parole. No cortical or large vessel territory infarction. No mass, hemorrhage, hydrocephalus or extra-axial collection. Vascular: There is atherosclerotic calcification of the major vessels at the base of the brain. Skull: Negative Sinuses/Orbits: Clear/normal Other: None IMPRESSION: 1. New since April low-density within the anterior right thalamus consistent with an infarction. This is exact age indeterminate. 2. Old appearing infarctions in the region of the lateral left thalamus/radiating white matter tracts. 3. No cortical or large vessel territory infarction. Electronically Signed   By: Oneil Officer M.D.   On: 10/13/2023 17:11   XR Cervical Spine With Flex & Extend Result Date: 10/13/2023 XRs of the cervical spine from 10/13/2023 were independently reviewed and interpreted, showing disc height loss with anterior osteophyte formation at C5/6.  No other significant degenerative  changes seen.  No evidence of instability on flexion/extension views.  No fracture or dislocation seen.   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  Anterior listhesis at L5/S1 that shifts about 0.5 mm between flexion and extension views.  Disc height loss at L5/S1.  No other significant degenerative changes seen.  No fracture or dislocation seen.   Procedures Procedures    Medications Ordered in ED Medications  lactated ringers  bolus 1,000 mL (1,000 mLs Intravenous New Bag/Given 10/13/23 1800)    ED Course/ Medical Decision Making/ A&P                                 Medical Decision Making Amount and/or Complexity of Data Reviewed External Data Reviewed: notes. Labs: ordered. Decision-making details documented in ED Course. Radiology: ordered and independent interpretation performed. Decision-making details documented in ED Course. ECG/medicine tests: ordered and independent interpretation performed. Decision-making details documented in ED Course.  Risk Decision regarding hospitalization.   Pt with multiple medical problems and comorbidities and presenting today with a complaint that caries a high risk for morbidity and mortality.  Patient presenting today with above complaints.  On my evaluation at 524 patient still complaining of numbness but involves the face arm and leg now and reports symptoms started at 4 PM.  Concern for new stroke with low NIH right now of 1 for numbness.  No other notable findings on exam.  Patient also admits to drinking alcohol today and symptoms could possibly be from intoxication.  Lower suspicion for infection, dissection but also concern for possible cervical etiology as he does have prior history of injury in this area.  Code stroke was called at 530 which is an hour and a half since symptoms started.  Blood sugar is 299  and patient given IV fluids.  I have independently visualized and interpreted pt's images today.  Without evidence of hemorrhage.  Radiology reports new since April low density within the anterior right thalamus consistent with infarct which is age-indeterminate as well as old appearing infarctions in the  region of the lateral left thalamus.  I independently interpreted patient's labs and EKG.  EKG without acute findings, CBG 299, CBC within normal limits and CMP with normal renal function, blood sugar of 312 and alcohol of 164.  Patient is positive for amphetamines and marijuana.  Dr. Lindzen with the neurology team evaluated the patient however does not feel he is a candidate for [facility] as he felt that his last known normal was several days ago also with the infarct on the CT and most likely did not start just an hour and a half ago.  He does want patient admitted to [facility] for stroke workup.  Did not recommend any aspirin  or other medications at this time.  His blood pressure has been reasonable.  Patient passed his swallow study.  Will consult to the hospitalist for admission and transfer to [facility].  Patient and his family are comfortable with this plan.          Final Clinical Impression(s) / ED Diagnoses Final diagnoses:  Cerebrovascular accident (CVA), unspecified mechanism (HCC)  Alcohol use  Hyperglycemia    Rx / DC Orders ED Discharge Orders     None         Doretha Folks, MD 10/13/23 1839

## 2023-10-13 NOTE — ED Triage Notes (Signed)
 Pt arrived via POV. C/o numbness in L arm that began at 1600 today. Pt has nerve damage on R side that makes comparing strength difficult. Pt Aox4, minor slurring of words, pt does admit to having a few alcoholic beverages early. No other focal deficits noted
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
## 2023-10-14 ENCOUNTER — Encounter: Payer: Self-pay | Admitting: Family Medicine

## 2023-10-14 ENCOUNTER — Observation Stay (HOSPITAL_COMMUNITY): Payer: BLUE CROSS/BLUE SHIELD

## 2023-10-14 DIAGNOSIS — F1590 Other stimulant use, unspecified, uncomplicated: Secondary | ICD-10-CM | POA: Insufficient documentation

## 2023-10-14 DIAGNOSIS — F1091 Alcohol use, unspecified, in remission: Secondary | ICD-10-CM

## 2023-10-14 DIAGNOSIS — M545 Low back pain, unspecified: Secondary | ICD-10-CM | POA: Diagnosis not present

## 2023-10-14 DIAGNOSIS — E876 Hypokalemia: Secondary | ICD-10-CM

## 2023-10-14 DIAGNOSIS — R011 Cardiac murmur, unspecified: Secondary | ICD-10-CM | POA: Diagnosis not present

## 2023-10-14 DIAGNOSIS — Z79899 Other long term (current) drug therapy: Secondary | ICD-10-CM | POA: Diagnosis not present

## 2023-10-14 DIAGNOSIS — E871 Hypo-osmolality and hyponatremia: Secondary | ICD-10-CM | POA: Diagnosis not present

## 2023-10-14 DIAGNOSIS — B20 Human immunodeficiency virus [HIV] disease: Secondary | ICD-10-CM | POA: Diagnosis not present

## 2023-10-14 DIAGNOSIS — Z789 Other specified health status: Secondary | ICD-10-CM

## 2023-10-14 DIAGNOSIS — I6381 Other cerebral infarction due to occlusion or stenosis of small artery: Secondary | ICD-10-CM | POA: Diagnosis not present

## 2023-10-14 DIAGNOSIS — G459 Transient cerebral ischemic attack, unspecified: Secondary | ICD-10-CM | POA: Diagnosis not present

## 2023-10-14 DIAGNOSIS — F1510 Other stimulant abuse, uncomplicated: Secondary | ICD-10-CM | POA: Insufficient documentation

## 2023-10-14 DIAGNOSIS — F17210 Nicotine dependence, cigarettes, uncomplicated: Secondary | ICD-10-CM | POA: Diagnosis not present

## 2023-10-14 DIAGNOSIS — Z5181 Encounter for therapeutic drug level monitoring: Secondary | ICD-10-CM | POA: Diagnosis not present

## 2023-10-14 DIAGNOSIS — I639 Cerebral infarction, unspecified: Secondary | ICD-10-CM | POA: Diagnosis not present

## 2023-10-14 DIAGNOSIS — F1591 Other stimulant use, unspecified, in remission: Secondary | ICD-10-CM | POA: Insufficient documentation

## 2023-10-14 DIAGNOSIS — Z7689 Persons encountering health services in other specified circumstances: Secondary | ICD-10-CM | POA: Diagnosis not present

## 2023-10-14 DIAGNOSIS — F1090 Alcohol use, unspecified, uncomplicated: Secondary | ICD-10-CM | POA: Diagnosis not present

## 2023-10-14 DIAGNOSIS — R200 Anesthesia of skin: Secondary | ICD-10-CM | POA: Diagnosis present

## 2023-10-14 DIAGNOSIS — F1911 Other psychoactive substance abuse, in remission: Secondary | ICD-10-CM

## 2023-10-14 DIAGNOSIS — F1910 Other psychoactive substance abuse, uncomplicated: Secondary | ICD-10-CM

## 2023-10-14 DIAGNOSIS — R739 Hyperglycemia, unspecified: Secondary | ICD-10-CM | POA: Diagnosis not present

## 2023-10-14 LAB — BASIC METABOLIC PANEL
Anion gap: 7 (ref 5–15)
BUN: 11 mg/dL (ref 6–20)
CO2: 24 mmol/L (ref 22–32)
Calcium: 9.1 mg/dL (ref 8.9–10.3)
Chloride: 102 mmol/L (ref 98–111)
Creatinine, Ser: 0.77 mg/dL (ref 0.61–1.24)
GFR, Estimated: >60 mL/min (ref >60)
Glucose, Bld: 143 mg/dL — ABNORMAL HIGH (ref 70–99)
Potassium: 4.2 mmol/L (ref 3.5–5.1)
Sodium: 133 mmol/L — ABNORMAL LOW (ref 135–145)

## 2023-10-14 LAB — GLUCOSE, CAPILLARY
Glucose-Capillary: 174 mg/dL — ABNORMAL HIGH (ref 70–99)
Glucose-Capillary: 157 mg/dL — ABNORMAL HIGH (ref 70–99)
Glucose-Capillary: 161 mg/dL — ABNORMAL HIGH (ref 70–99)

## 2023-10-14 LAB — ECHOCARDIOGRAM COMPLETE
AR max vel: 1.11 cm2
AV Area VTI: 1.13 cm2
AV Area mean vel: 1.32 cm2
AV Mean grad: 13 mm[Hg]
AV Peak grad: 25.6 mm[Hg]
Ao pk vel: 2.53 m/s
Area-P 1/2: 4.96 cm2
Height: 66 in
MV VTI: 1.62 cm2
S' Lateral: 3.7 cm
Weight: 2544 [oz_av]

## 2023-10-14 LAB — LIPID PANEL
Cholesterol: 290 mg/dL — ABNORMAL HIGH (ref 0–200)
HDL: 62 mg/dL (ref >40)
LDL Cholesterol: 208 mg/dL — ABNORMAL HIGH (ref 0–99)
Total CHOL/HDL Ratio: 4.7 {ratio}
Triglycerides: 102 mg/dL (ref <150)
VLDL: 20 mg/dL (ref 0–40)

## 2023-10-14 LAB — MAGNESIUM: Magnesium: 1.7 mg/dL (ref 1.7–2.4)

## 2023-10-14 LAB — CRYPTOCOCCAL ANTIGEN: Crypto Ag: NEGATIVE

## 2023-10-14 LAB — CBG MONITORING, ED
Glucose-Capillary: 189 mg/dL — ABNORMAL HIGH (ref 70–99)
Glucose-Capillary: 266 mg/dL — ABNORMAL HIGH (ref 70–99)

## 2023-10-14 LAB — HIV ANTIBODY (ROUTINE TESTING W REFLEX): HIV Screen 4th Generation wRfx: REACTIVE — AB

## 2023-10-14 LAB — HEPATITIS B SURFACE ANTIGEN: Hepatitis B Surface Ag: NONREACTIVE

## 2023-10-14 MED ORDER — ATORVASTATIN CALCIUM 40 MG PO TABS: 40.0000 mg | ORAL_TABLET | Freq: Every day | ORAL | Status: DC

## 2023-10-14 MED ORDER — CLOPIDOGREL BISULFATE 75 MG PO TABS
75.0000 mg | ORAL_TABLET | Freq: Every day | ORAL | Status: DC
  Administered 2023-10-14 – 2023-10-15 (×2): 75 mg via ORAL
  Filled 2023-10-14 (×3): qty 1

## 2023-10-14 MED ORDER — ATORVASTATIN CALCIUM 80 MG PO TABS
80.0000 mg | ORAL_TABLET | Freq: Every day | ORAL | Status: DC
  Administered 2023-10-14 – 2023-10-15 (×2): 80 mg via ORAL
  Filled 2023-10-14 (×2): qty 1

## 2023-10-14 MED ORDER — IOHEXOL 350 MG/ML SOLN
75.0000 mL | Freq: Once | INTRAVENOUS | Status: AC | PRN
  Administered 2023-10-14: 75 mL via INTRAVENOUS

## 2023-10-14 NOTE — Progress Notes (Signed)
 STROKE TEAM PROGRESS NOTE   SUBJECTIVE (INTERVAL HISTORY) His wife is at the bedside.  Overall his condition is gradually improving. He stated that his slurry speech has improved from yesterday, still feel tingling on the left face arm and leg but no weakness. He has chronic right sided weakness for some time but can not tell me when it was really started.    OBJECTIVE Temp:  [97.2 F (36.2 C)-98.7 F (37.1 C)] 98.7 F (37.1 C) (01/07 1138) Pulse Rate:  [83-103] 85 (01/07 1138) Cardiac Rhythm: Normal sinus rhythm (01/07 1200) Resp:  [17-25] 18 (01/07 1138) BP: (142-171)/(76-99) 157/92 (01/07 1138) SpO2:  [93 %-100 %] 97 % (01/07 1138)  Recent Labs  Lab 10/13/23 1617 10/14/23 0028 10/14/23 0733 10/14/23 1139  GLUCAP 299* 266* 189* 174*   Recent Labs  Lab 10/13/23 1625 10/14/23 0451  NA 134* 133*  K 3.4* 4.2  CL 100 102  CO2 21* 24  GLUCOSE 312* 143*  BUN 12 11  CREATININE 0.76 0.77  CALCIUM  9.5 9.1  MG  --  1.7   Recent Labs  Lab 10/13/23 1625  AST 23  ALT 17  ALKPHOS 81  BILITOT 0.5  PROT 8.1  ALBUMIN 3.8   Recent Labs  Lab 10/13/23 1625  WBC 5.2  NEUTROABS 3.3  HGB 13.0  HCT 39.4  MCV 91.8  PLT 257   No results for input(s): CKTOTAL, CKMB, CKMBINDEX, TROPONINI in the last 168 hours. No results for input(s): LABPROT, INR in the last 72 hours. No results for input(s): COLORURINE, LABSPEC, PHURINE, GLUCOSEU, HGBUR, BILIRUBINUR, KETONESUR, PROTEINUR, UROBILINOGEN, NITRITE, LEUKOCYTESUR in the last 72 hours.  Invalid input(s): APPERANCEUR     Component Value Date/Time   CHOL 290 (H) 10/14/2023 0451   TRIG 102 10/14/2023 0451   HDL 62 10/14/2023 0451   CHOLHDL 4.7 10/14/2023 0451   VLDL 20 10/14/2023 0451   LDLCALC 208 (H) 10/14/2023 0451   Lab Results  Component Value Date   HGBA1C 6.3 08/19/2023      Component Value Date/Time   LABOPIA NONE DETECTED 10/13/2023 1625   COCAINSCRNUR NONE DETECTED 10/13/2023  1625   LABBENZ NONE DETECTED 10/13/2023 1625   AMPHETMU POSITIVE (A) 10/13/2023 1625   THCU POSITIVE (A) 10/13/2023 1625   LABBARB NONE DETECTED 10/13/2023 1625    Recent Labs  Lab 10/13/23 1625  ETH 164*    I have personally reviewed the radiological images below and agree with the radiology interpretations.  MR Cervical Spine Wo Contrast Result Date: 10/13/2023 CLINICAL DATA:  Initial evaluation for acute myelopathy. Left shoulder pain. EXAM: MRI CERVICAL SPINE WITHOUT CONTRAST TECHNIQUE: Multiplanar, multisequence MR imaging of the cervical spine was performed. No intravenous contrast was administered. COMPARISON:  Prior radiograph from earlier the same day. FINDINGS: Alignment: Examination degraded by motion artifact, limiting assessment. Specifically, the axial sequences are markedly degraded, limiting assessment of potential foraminal encroachment. Vertebral bodies normally aligned with preservation of the normal cervical lordosis. No significant listhesis. Vertebrae: Vertebral body height maintained without acute or chronic fracture. Bone marrow signal intensity overall within normal limits. No visible worrisome osseous lesions. No significant abnormal marrow edema. Cord: Grossly normal signal and morphology. Posterior Fossa, vertebral arteries, paraspinal tissues: Paraspinous soft tissues demonstrate no visible acute or significant abnormality. Disc levels: C2-C3: Unremarkable. C3-C4: Central disc protrusion indents the ventral thecal sac, contacting and mildly flattening the ventral cord. No visible cord signal changes. Mild spinal stenosis. Foramina appear grossly patent. C4-C5: Mild disc bulge with uncovertebral spurring.  No significant spinal stenosis. Mild to moderate left C5 foraminal stenosis. Right neural foramen appears grossly patent. C5-C6: Degenerate intervertebral disc space narrowing with diffuse disc osteophyte complex. No significant spinal stenosis. Moderate to severe right  with mild left C6 foraminal narrowing. C6-C7: Mild disc bulge with uncovertebral spurring. No significant spinal stenosis. Mild left with moderate right C7 foraminal stenosis. C7-T1:  Unremarkable. IMPRESSION: 1. Motion degraded exam. 2. No definite acute abnormality within the cervical spine or spinal cord. 3. Central disc protrusion at C3-4 with resultant mild spinal stenosis. 4. Multifactorial degenerative changes with resultant multilevel foraminal narrowing as above. Notable findings include mild to moderate left C5 foraminal stenosis, moderate to severe right with mild left C6 foraminal narrowing, with moderate right C7 foraminal stenosis. Electronically Signed   By: Morene Hoard M.D.   On: 10/13/2023 19:59   MR BRAIN WO CONTRAST Result Date: 10/13/2023 CLINICAL DATA:  Initial evaluation for acute neuro deficit, stroke suspected. EXAM: MRI HEAD WITHOUT CONTRAST TECHNIQUE: Multiplanar, multiecho pulse sequences of the brain and surrounding structures were obtained without intravenous contrast. COMPARISON:  Prior CT from earlier the same day. FINDINGS: Brain: Cerebral volume within normal limits. Mild chronic microvascular ischemic disease for age. Remote lacunar infarcts present about the bilateral thalami. Associated wallerian degeneration extending into the cerebral peduncles and brainstem noted. 1 cm focus of restricted diffusion involving the lateral right thalamus, consistent with an acute ischemic nonhemorrhagic infarct (series 10, image 24). Additional 2 cm focus of diffusion signal abnormality involving the left basal ganglia, also consistent with an ischemic infarct, slightly older in appearance and acute to early subacute in nature. No associated hemorrhage or mass effect. No other evidence for acute or subacute ischemia. Gray-white matter differentiation otherwise maintained. No other acute or chronic intracranial blood products. No mass lesion, midline shift or mass effect. No  hydrocephalus or extra-axial fluid collection. Pituitary gland suprasellar region within normal limits. Vascular: Major intracranial vascular flow voids are maintained. Skull and upper cervical spine: Craniocervical junction within normal limits. Bone marrow signal intensity normal. No scalp soft tissue abnormality. Sinuses/Orbits: Globes and orbital soft tissues within normal limits. Scattered mucosal thickening noted about the ethmoidal air cells and maxillary sinuses. No significant mastoid effusion. Other: None. IMPRESSION: 1. 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 2. 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 3. 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 Electronically Signed   By: Morene Hoard M.D.   On: 10/13/2023 19:54   CT Head Wo Contrast Result Date: 10/13/2023 CLINICAL DATA:  Numbness in the left arm beginning today. EXAM: CT HEAD WITHOUT CONTRAST TECHNIQUE: Contiguous axial images were obtained from the base of the skull through the vertex without intravenous contrast. RADIATION DOSE REDUCTION: This exam was performed according to the departmental dose-optimization program which includes automated exposure control, adjustment of the mA and/or kV according to patient size and/or use of iterative reconstruction technique. COMPARISON:  01/07/2023 FINDINGS: Brain: No focal abnormality affects the brainstem or cerebellum. New low-density seen within the anterior right thalamus consistent with an infarction. This is age indeterminate. On the left, there are old appearing infarctions in the region of the lateral thalamus/radiating white matter tracts. None of these findings were present in a parole. No cortical or large vessel territory infarction. No mass, hemorrhage, hydrocephalus or extra-axial collection. Vascular: There is atherosclerotic calcification of the major  vessels at the base of the brain. Skull: Negative Sinuses/Orbits:  Clear/normal Other: None IMPRESSION: 1. New since April low-density within the anterior right thalamus consistent with an infarction. This is exact age indeterminate. 2. Old appearing infarctions in the region of the lateral left thalamus/radiating white matter tracts. 3. No cortical or large vessel territory infarction. Electronically Signed   By: Oneil Officer M.D.   On: 10/13/2023 17:11   XR Cervical Spine With Flex & Extend Result Date: 10/13/2023 XRs of the cervical spine from 10/13/2023 were independently reviewed and interpreted, showing disc height loss with anterior osteophyte formation at C5/6.  No other significant degenerative changes seen.  No evidence of instability on flexion/extension views.  No fracture or dislocation seen.   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  Anterior listhesis at L5/S1 that shifts about 0.5 mm between flexion and extension views.  Disc height loss at L5/S1.  No other significant degenerative changes seen.  No fracture or dislocation seen.    PHYSICAL EXAM  Temp:  [97.2 F (36.2 C)-98.7 F (37.1 C)] 98.7 F (37.1 C) (01/07 1138) Pulse Rate:  [83-103] 85 (01/07 1138) Resp:  [17-25] 18 (01/07 1138) BP: (142-171)/(76-99) 157/92 (01/07 1138) SpO2:  [93 %-100 %] 97 % (01/07 1138)  General - Well nourished, well developed, in no apparent distress.  Ophthalmologic - fundi not visualized due to noncooperation.  Cardiovascular - Regular rhythm and rate.  Mental Status -  Level of arousal and orientation to time, place, and person were intact. Language including expression, naming, repetition, comprehension was assessed and found intact. Mild dysarthria Fund of Knowledge was assessed and was intact.  Cranial Nerves II - XII - II - Visual field intact  OU. III, IV, VI - Extraocular movements intact. V - Facial sensation intact bilaterally, subjective left facial tingling. VII - Facial movement intact bilaterally. VIII - Hearing & vestibular intact bilaterally. X - Palate elevates symmetrically. XI - Chin turning & shoulder shrug intact bilaterally. XII - Tongue protrusion intact.  Motor Strength - The patient's strength was normal in LUE and LLE, but mild RUE pronator drift and decreased bicep tricep and finger grip, 4+/5. Mildly decreased R ankle DF and PF, 5-/5.  Bulk was normal and fasciculations were absent.   Motor Tone - Muscle tone was assessed at the neck and appendages and was normal.  Reflexes - The patient's reflexes were symmetrical in all extremities and he had no pathological reflexes.  Sensory - Light touch, temperature/pinprick were assessed and were symmetrical, but subjective tingling on the left UE and LE.    Coordination - The patient had normal movements in the hands with no ataxia or dysmetria but slow on the right.  Tremor was absent.  Gait and Station - deferred.   ASSESSMENT/PLAN Mr. Keylor Rands is a 53 y.o. male with history of smoker, alcohol use and substance abuse admitted for left sided numbness. No TNK given due to outside window.    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 On my review, there was left midbrain and pontine wallerian degeneration present, so the left BG/CR infarct likely to be more chronic  CTA head and neck pending 2D Echo  pending LDL 208 HgbA1c 6.3 in 08/2022 UDS + amphetamine and THC Lovenox   for VTE prophylaxis No antithrombotic prior to admission, now on aspirin  81 mg daily and clopidogrel  75 mg daily DAPT Patient counseled to be compliant with  his antithrombotic medications Ongoing aggressive stroke risk factor management Therapy recommendations:  pending Disposition:  pending  Hypertension Stable Avoid low BP Long term BP goal normotensive  Hyperlipidemia Home meds:  none  LDL 208, goal < 70 Now on lipitor  80 Continue statin at discharge  Tobacco abuse Current smoker Smoking cessation counseling provided Pt is willing to quit  Alcohol abuse Alcohol limiting education provided On B1/FA/MVI  Substance abuse Hx of cocaine in the past Current UDS + amphetamine and THC Cessation education provided Pt is willing to quit  Other Stroke Risk Factors   Other Active Problems Neck pain - following with orthopedics LBP - following with orthopedics  Hospital day # 0    Ary Cummins, MD PhD Stroke Neurology 10/14/2023 12:55 PM    To contact Stroke Continuity provider, please refer to [email or web address]. After hours, contact General Neurology

## 2023-10-14 NOTE — Evaluation (Signed)
 Physical Therapy Evaluation Patient Details Name: Bruce Yang MRN: [id number] DOB: 09/26/1971 Today's Date: 10/14/2023  History of Present Illness  Arrived to the ED on 10/13/23, patient is a 53 y.o. male with medical history significant of polysubstance abuse, chronic low back pain presented to ED with complaint of numbness of his left face/arm/leg which initially began 3 days ago but worsened. MRI results showed 1cm acute ischemic nonhemorrhagic Right thalamic infarct, 2cm acute early subacute ischemic Left basal ganglia infarct.  Clinical Impression  Pt admitted with above diagnosis.  Pt currently with functional limitations due to the deficits listed below (see PT Problem List). Pt will benefit from acute skilled PT to increase their independence and safety with mobility to allow discharge.       Patient presents with  decreased control Left LE which is new, reports right weakness is not new. Patient required  RW for safe ambulation, noted narrow base.  Patient reports being independent, at times used rollator , and driving( on day of admit).  Recommend OPPT  after DC.SABRA    If plan is discharge home, recommend the following: A little help with walking and/or transfers;Assistance with cooking/housework;Assist for transportation;Help with stairs or ramp for entrance   Can travel by private vehicle        Equipment Recommendations None recommended by PT  Recommendations for Other Services       Functional Status Assessment Patient has had a recent decline in their functional status and demonstrates the ability to make significant improvements in function in a reasonable and predictable amount of time.     Precautions / Restrictions Precautions Precautions: Fall Precaution Comments: slight left lateral lean when up walking with therapy. Restrictions Weight Bearing Restrictions Per Provider Order: No      Mobility  Bed Mobility Overal bed mobility: Modified Independent              General bed mobility comments: increased time and effort. no physical assist.    Transfers   Equipment used: Rolling walker (2 wheels)   Sit to Stand: Contact guard assist   Step pivot transfers: Contact guard assist       General transfer comment: ,    Ambulation/Gait Ambulation/Gait assistance: Contact guard assist Gait Distance (Feet): 120 Feet Assistive device: Rolling walker (2 wheels) Gait Pattern/deviations: Step-through pattern, Narrow base of support Gait velocity: decr     General Gait Details: feet very close together, cues to widen , decreased  step length bilaterally  Stairs            Wheelchair Mobility     Tilt Bed    Modified Rankin (Stroke Patients Only)       Balance   Sitting-balance support: No upper extremity supported, Feet supported Sitting balance-Leahy Scale: Good Sitting balance - Comments: able to don hospital socks while seated on stretcher.   Standing balance support: During functional activity, No upper extremity supported Standing balance-Leahy Scale: Fair Standing balance comment: patient  required  support for dynamic balance, required Rw for amb.                             Pertinent Vitals/Pain Pain Assessment Pain Assessment: Faces Faces Pain Scale: Hurts little more Pain Location: back Pain Descriptors / Indicators: Discomfort, Aching Pain Intervention(s): Monitored during session    Home Living Family/patient expects to be discharged to:: Private residence Living Arrangements: Non-relatives/Friends Available Help at Discharge: Friend(s);Available PRN/intermittently Type of Home:  House Home Access: Stairs to enter   [employer] of Steps: 4   Home Layout: One level Home Equipment: Rollator (4 wheels) Additional Comments: Pt reports that he uses a Rollator occasionally when he feels off balance.    Prior Function Prior Level of Function : Independent/Modified  Independent;Driving             Mobility Comments: used RW if felt weak       Extremity/Trunk Assessment   Upper Extremity Assessment Upper Extremity Assessment: Left hand dominant;Defer to OT evaluation RUE Deficits / Details: Baseline weakness and ROM restrictions from previous injury. LUE Deficits / Details: Pt able to demonstrate full A/ROM and 5/5 strength in shoulder, elbow, wrist. Intact gross grasp. Reports numbness in entire left arm that extends up into left side of face and mouth. LUE Sensation: decreased light touch LUE Coordination: decreased fine motor    Lower Extremity Assessment Lower Extremity Assessment: RLE deficits/detail;LLE deficits/detail RLE Deficits / Details: grossly 3/5 dorsiflex, knee ext 4, LLE Deficits / Details: grossly 4 /5 throughout    Cervical / Trunk Assessment Cervical / Trunk Assessment: Normal  Communication   Communication Communication: No apparent difficulties  Cognition Arousal: Alert Behavior During Therapy: Flat affect Overall Cognitive Status: Within Functional Limits for tasks assessed                                          General Comments      Exercises     Assessment/Plan    PT Assessment Patient needs continued PT services  PT Problem List Decreased strength;Decreased balance;Decreased mobility;Decreased coordination;Impaired sensation       PT Treatment Interventions DME instruction;Gait training;Patient/family education;Balance training;Functional mobility training;Neuromuscular re-education    PT Goals (Current goals can be found in the Care Plan section)  Acute Rehab PT Goals Patient Stated Goal: go home, drive PT Goal Formulation: With patient/family Time For Goal Achievement: 10/28/23 Potential to Achieve Goals: Good    Frequency Min 1X/week     Co-evaluation PT/OT/SLP Co-Evaluation/Treatment: Yes Reason for Co-Treatment: To address functional/ADL transfers PT goals  addressed during session: Mobility/safety with mobility;Balance         AM-PAC PT 6 Clicks Mobility  Outcome Measure Help needed turning from your back to your side while in a flat bed without using bedrails?: None Help needed moving from lying on your back to sitting on the side of a flat bed without using bedrails?: None Help needed moving to and from a bed to a chair (including a wheelchair)?: None Help needed standing up from a chair using your arms (e.g., wheelchair or bedside chair)?: A Little Help needed to walk in hospital room?: A Little Help needed climbing 3-5 steps with a railing? : A Lot 6 Click Score: 20    End of Session Equipment Utilized During Treatment: Gait belt Activity Tolerance: Patient tolerated treatment well Patient left: in bed;with call bell/phone within reach;with family/visitor present Nurse Communication: Mobility status PT Visit Diagnosis: Unsteadiness on feet (R26.81);Other symptoms and signs involving the nervous system (R29.898);Difficulty in walking, not elsewhere classified (R26.2);History of falling (Z91.81);Pain    Time: 0911-0930 PT Time Calculation (min) (ACUTE ONLY): 19 min   Charges:   PT Evaluation $PT Eval Low Complexity: 1 Low   PT General Charges $$ ACUTE PT VISIT: 1 Visit         .sogn   Ahlana Slaydon, Darice Norris  10/14/2023, 11:59 AM

## 2023-10-14 NOTE — ED Notes (Signed)
 Called [facility] to give report    charge said the nurse for that room would call me back for report

## 2023-10-14 NOTE — ED Notes (Signed)
 Pts morning labs were obtained and assisted repositioning pt in the bed. Pt was given a breakfast sandwich and apple juice. Family at bedside.

## 2023-10-14 NOTE — Progress Notes (Signed)
 PROGRESS NOTE  Bruce Yang:[id number] DOB: 30-Dec-1970 DOA: 10/13/2023 PCP: [name], MD  HPI/Recap of past 24 hours: Bruce Yang is a 53 y.o. male with medical history significant of polysubstance abuse, chronic low back pain presented to ED with complaint of numbness of his left face/arm/leg X 3 days.  In the ED, vital signs fairly stable.  Labs showed ethanol level 164, UDS positive for amphetamines and THC.  CT head showing new low density within the anterior right thalamus consistent with an infarction, exact age indeterminate. Brain MRI showing a 1 cm acute ischemic nonhemorrhagic right thalamic infarct and a 2 cm acute to early subacute ischemic nonhemorrhagic left basal ganglia infarct. MRI of C-spine negative for acute abnormality. Neurology recommended admission to [facility] for stroke workup. Patient passed his swallow study in the ED. TRH called to admit.     Patient denies any new complaints.  Patient denies any shortness of breath, chest pain, abdominal pain, nausea/vomiting, fever/chills.  Currently no obvious focal neurologic deficit noted.    Assessment/Plan: Principal Problem:   Acute CVA (cerebrovascular accident) [facility]) Active Problems:   Hyperglycemia   Alcohol use   Hypokalemia   Hyponatremia   Substance abuse (HCC)   Cardiac murmur   Acute CVA Hyperlipidemia Brain MRI showing a 1 cm acute ischemic nonhemorrhagic right thalamic infarct and a 2 cm acute to early subacute ischemic nonhemorrhagic left basal ganglia infarct A1c was 6.3 on 08/2023, LDL 208 Echo pending Neurology consulted Continue aspirin , Plavix , started on Lipitor  PT/OT/speech Frequent neurochecks Telemetry   Alcohol use Patient reported consuming several alcoholic beverages PTA Ethanol level 164 Placed on CIWA protocol; Ativan  as needed.  Thiamine , folate, and multivitamin.   Hyperglycemia Glucose 312 without signs of DKA Last A1c 6.3 on 08/19/2023 Placed  on sensitive sliding scale insulin  ACHS.  Carb modified diet.   Polysubstance abuse UDS positive for THC and amphetamines (not seen on his home medication list) TOC consulted for substance abuse counseling.   Cardiac murmur Echocardiogram ordered for further evaluation, no previous echo results in the chart.      Estimated body mass index is 25.66 kg/m as calculated from the following:   Height as of an earlier encounter on 10/13/23: 5' 6 (1.676 m).   Weight as of an earlier encounter on 10/13/23: 72.1 kg.     Code Status: Full  Family Communication: Girlfriend at bedside  Disposition Plan: Status is: Observation The patient remains OBS appropriate and will d/c before 2 midnights.      Consultants: Neurology  Procedures: None  Antimicrobials: None  DVT prophylaxis: Lovenox    Objective: Vitals:   10/14/23 0645 10/14/23 0700 10/14/23 0730 10/14/23 1037  BP:  (!) 156/99 (!) 159/95 (!) 167/93  Pulse: 90 (!) 103 88 83  Resp: 20 (!) 21 19 18   Temp: 98.2 F (36.8 C)   98.1 F (36.7 C)  TempSrc: Oral   Oral  SpO2: 98% 93% 98% 98%   No intake or output data in the 24 hours ending 10/14/23 1045 There were no vitals filed for this visit.  Exam: General: NAD  Cardiovascular: S1, S2 present, murmur noted Respiratory: CTAB Abdomen: Soft, nontender, nondistended, bowel sounds present Musculoskeletal: No bilateral pedal edema noted Skin: Normal Psychiatry: Normal mood  Neurology: Strength equal in all extremities, sensation intact, no focal neurologic deficits noted    Data Reviewed: CBC: Recent Labs  Lab 10/13/23 1625  WBC 5.2  NEUTROABS 3.3  HGB 13.0  HCT 39.4  MCV 91.8  PLT 257   Basic Metabolic Panel: Recent Labs  Lab 10/13/23 1625 10/14/23 0451  NA 134* 133*  K 3.4* 4.2  CL 100 102  CO2 21* 24  GLUCOSE 312* 143*  BUN 12 11  CREATININE 0.76 0.77  CALCIUM  9.5 9.1  MG  --  1.7   GFR: Estimated Creatinine Clearance: 95.3 mL/min (by C-G  formula based on SCr of 0.77 mg/dL). Liver Function Tests: Recent Labs  Lab 10/13/23 1625  AST 23  ALT 17  ALKPHOS 81  BILITOT 0.5  PROT 8.1  ALBUMIN 3.8   No results for input(s): LIPASE, AMYLASE in the last 168 hours. No results for input(s): AMMONIA in the last 168 hours. Coagulation Profile: No results for input(s): INR, PROTIME in the last 168 hours. Cardiac Enzymes: No results for input(s): CKTOTAL, CKMB, CKMBINDEX, TROPONINI in the last 168 hours. BNP (last 3 results) No results for input(s): PROBNP in the last 8760 hours. HbA1C: No results for input(s): HGBA1C in the last 72 hours. CBG: Recent Labs  Lab 10/13/23 1617 10/14/23 0028 10/14/23 0733  GLUCAP 299* 266* 189*   Lipid Profile: Recent Labs    10/14/23 0451  CHOL 290*  HDL 62  LDLCALC 208*  TRIG 102  CHOLHDL 4.7   Thyroid  Function Tests: No results for input(s): TSH, T4TOTAL, FREET4, T3FREE, THYROIDAB in the last 72 hours. Anemia Panel: No results for input(s): VITAMINB12, FOLATE, FERRITIN, TIBC, IRON, RETICCTPCT in the last 72 hours. Urine analysis:    Component Value Date/Time   COLORURINE YELLOW 01/07/2023 1219   APPEARANCEUR CLEAR 01/07/2023 1219   LABSPEC 1.011 01/07/2023 1219   PHURINE 5.0 01/07/2023 1219   GLUCOSEU NEGATIVE 01/07/2023 1219   HGBUR SMALL (A) 01/07/2023 1219   BILIRUBINUR NEGATIVE 01/07/2023 1219   KETONESUR NEGATIVE 01/07/2023 1219   PROTEINUR NEGATIVE 01/07/2023 1219   UROBILINOGEN 0.2 05/23/2020 1028   NITRITE NEGATIVE 01/07/2023 1219   LEUKOCYTESUR NEGATIVE 01/07/2023 1219   Sepsis Labs: @LABRCNTIP (procalcitonin:4,lacticidven:4)  )No results found for this or any previous visit (from the past 240 hours).    Studies: MR Cervical Spine Wo Contrast Result Date: 10/13/2023 CLINICAL DATA:  Initial evaluation for acute myelopathy. Left shoulder pain. EXAM: MRI CERVICAL SPINE WITHOUT CONTRAST TECHNIQUE: Multiplanar,  multisequence MR imaging of the cervical spine was performed. No intravenous contrast was administered. COMPARISON:  Prior radiograph from earlier the same day. FINDINGS: Alignment: Examination degraded by motion artifact, limiting assessment. Specifically, the axial sequences are markedly degraded, limiting assessment of potential foraminal encroachment. Vertebral bodies normally aligned with preservation of the normal cervical lordosis. No significant listhesis. Vertebrae: Vertebral body height maintained without acute or chronic fracture. Bone marrow signal intensity overall within normal limits. No visible worrisome osseous lesions. No significant abnormal marrow edema. Cord: Grossly normal signal and morphology. Posterior Fossa, vertebral arteries, paraspinal tissues: Paraspinous soft tissues demonstrate no visible acute or significant abnormality. Disc levels: C2-C3: Unremarkable. C3-C4: Central disc protrusion indents the ventral thecal sac, contacting and mildly flattening the ventral cord. No visible cord signal changes. Mild spinal stenosis. Foramina appear grossly patent. C4-C5: Mild disc bulge with uncovertebral spurring. No significant spinal stenosis. Mild to moderate left C5 foraminal stenosis. Right neural foramen appears grossly patent. C5-C6: Degenerate intervertebral disc space narrowing with diffuse disc osteophyte complex. No significant spinal stenosis. Moderate to severe right with mild left C6 foraminal narrowing. C6-C7: Mild disc bulge with uncovertebral spurring. No significant spinal stenosis. Mild left with moderate right C7 foraminal stenosis. C7-T1:  Unremarkable.  IMPRESSION: 1. Motion degraded exam. 2. No definite acute abnormality within the cervical spine or spinal cord. 3. Central disc protrusion at C3-4 with resultant mild spinal stenosis. 4. Multifactorial degenerative changes with resultant multilevel foraminal narrowing as above. Notable findings include mild to moderate left C5  foraminal stenosis, moderate to severe right with mild left C6 foraminal narrowing, with moderate right C7 foraminal stenosis. Electronically Signed   By: Morene Hoard M.D.   On: 10/13/2023 19:59   MR BRAIN WO CONTRAST Result Date: 10/13/2023 CLINICAL DATA:  Initial evaluation for acute neuro deficit, stroke suspected. EXAM: MRI HEAD WITHOUT CONTRAST TECHNIQUE: Multiplanar, multiecho pulse sequences of the brain and surrounding structures were obtained without intravenous contrast. COMPARISON:  Prior CT from earlier the same day. FINDINGS: Brain: Cerebral volume within normal limits. Mild chronic microvascular ischemic disease for age. Remote lacunar infarcts present about the bilateral thalami. Associated wallerian degeneration extending into the cerebral peduncles and brainstem noted. 1 cm focus of restricted diffusion involving the lateral right thalamus, consistent with an acute ischemic nonhemorrhagic infarct (series 10, image 24). Additional 2 cm focus of diffusion signal abnormality involving the left basal ganglia, also consistent with an ischemic infarct, slightly older in appearance and acute to early subacute in nature. No associated hemorrhage or mass effect. No other evidence for acute or subacute ischemia. Gray-white matter differentiation otherwise maintained. No other acute or chronic intracranial blood products. No mass lesion, midline shift or mass effect. No hydrocephalus or extra-axial fluid collection. Pituitary gland suprasellar region within normal limits. Vascular: Major intracranial vascular flow voids are maintained. Skull and upper cervical spine: Craniocervical junction within normal limits. Bone marrow signal intensity normal. No scalp soft tissue abnormality. Sinuses/Orbits: Globes and orbital soft tissues within normal limits. Scattered mucosal thickening noted about the ethmoidal air cells and maxillary sinuses. No significant mastoid effusion. Other: None. IMPRESSION: 1. 1  cm acute ischemic nonhemorrhagic right thalamic infarct. 2. 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 3. 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 Electronically Signed   By: Morene Hoard M.D.   On: 10/13/2023 19:54   CT Head Wo Contrast Result Date: 10/13/2023 CLINICAL DATA:  Numbness in the left arm beginning today. EXAM: CT HEAD WITHOUT CONTRAST TECHNIQUE: Contiguous axial images were obtained from the base of the skull through the vertex without intravenous contrast. RADIATION DOSE REDUCTION: This exam was performed according to the departmental dose-optimization program which includes automated exposure control, adjustment of the mA and/or kV according to patient size and/or use of iterative reconstruction technique. COMPARISON:  01/07/2023 FINDINGS: Brain: No focal abnormality affects the brainstem or cerebellum. New low-density seen within the anterior right thalamus consistent with an infarction. This is age indeterminate. On the left, there are old appearing infarctions in the region of the lateral thalamus/radiating white matter tracts. None of these findings were present in a parole. No cortical or large vessel territory infarction. No mass, hemorrhage, hydrocephalus or extra-axial collection. Vascular: There is atherosclerotic calcification of the major vessels at the base of the brain. Skull: Negative Sinuses/Orbits: Clear/normal Other: None IMPRESSION: 1. New since April low-density within the anterior right thalamus consistent with an infarction. This is exact age indeterminate. 2. Old appearing infarctions in the region of the lateral left thalamus/radiating white matter tracts. 3. No cortical or large vessel territory infarction. Electronically Signed   By: Oneil Officer M.D.   On: 10/13/2023 17:11   XR Cervical Spine  With Flex & Extend Result Date: 10/13/2023 XRs of the cervical  spine from 10/13/2023 were independently reviewed and interpreted, showing disc height loss with anterior osteophyte formation at C5/6.  No other significant degenerative changes seen.  No evidence of instability on flexion/extension views.  No fracture or dislocation seen.   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  Anterior listhesis at L5/S1 that shifts about 0.5 mm between flexion and extension views.  Disc height loss at L5/S1.  No other significant degenerative changes seen.  No fracture or dislocation seen.   Scheduled Meds:   stroke: early stages of recovery book   Does not apply Once   aspirin  EC  81 mg Oral Daily   atorvastatin   80 mg Oral Daily   clopidogrel   75 mg Oral Daily   enoxaparin  (LOVENOX ) injection  40 mg Subcutaneous Q24H   folic acid   1 mg Oral Daily   insulin  aspart  0-5 Units Subcutaneous QHS   insulin  aspart  0-9 Units Subcutaneous TID WC   multivitamin with minerals  1 tablet Oral Daily   thiamine   100 mg Oral Daily   Or   thiamine   100 mg Intravenous Daily    Continuous Infusions:   LOS: 0 days     Lebron JINNY Cage, MD [facility]  If 7PM-7AM, please contact night-coverage [email or web address] 10/14/2023, 10:45 AM

## 2023-10-14 NOTE — TOC Initial Note (Signed)
 Transition of Care [facility]) - Initial/Assessment Note    Patient Details  Name: Bruce Yang MRN: [id number] Date of Birth: 05-18-1971  Transition of Care [facility]) CM/SW Contact:    Andrez JULIANNA George, RN Phone Number: 10/14/2023, 3:45 PM  Clinical Narrative:                  Pt is from home with his cousin. Cousin is there most of the time. Pts significant other works during the daytime but can assist in evenings.  Pt has rollator at home. PT recommending rolling walker. CM as ordered this through [employer] and it will be delivered to the room. Outpatient PT/OT referral sent to [facility]. Pt will call to schedule the first appointment. Information on the AVS.  Pts SO or cousin are able to provide needed transportation. CM will also send in request for caregiver to his medicaid to assist with ADL's at home. TOC following.   Barriers to Discharge: Continued Medical Work up   Patient Goals and CMS Choice     Choice offered to / list presented to : Patient      Expected Discharge Plan and Services   Discharge Planning Services: CM Consult   Living arrangements for the past 2 months: Single Family Home                 DME Arranged: Walker rolling DME Agency: [employer] Date DME Agency Contacted: 10/14/23   Representative spoke with at DME Agency: Zack            Prior Living Arrangements/Services Living arrangements for the past 2 months: Single Family Home Lives with:: Relatives (cousin) Patient language and need for interpreter reviewed:: Yes Do you feel safe going back to the place where you live?: Yes        Care giver support system in place?: No (comment) Current home services: DME (Rollator) Criminal Activity/Legal Involvement Pertinent to Current Situation/Hospitalization: No - Comment as needed  Activities of Daily Living   ADL Screening (condition at time of admission) Independently performs ADLs?: Yes (appropriate for developmental age) Is the patient  deaf or have difficulty hearing?: No Does the patient have difficulty seeing, even when wearing glasses/contacts?: No Does the patient have difficulty concentrating, remembering, or making decisions?: No  Permission Sought/Granted                  Emotional Assessment Appearance:: Appears stated age Attitude/Demeanor/Rapport: Engaged Affect (typically observed): Accepting Orientation: : Oriented to Self, Oriented to Place, Oriented to Situation, Oriented to  Time Alcohol / Substance Use: Illicit Drugs, Alcohol Use Psych Involvement: No (comment)  Admission diagnosis:  Hyperglycemia [R73.9] Alcohol use [Z78.9] Acute CVA (cerebrovascular accident) (HCC) [I63.9] Cerebrovascular accident (CVA), unspecified mechanism (HCC) [I63.9] Patient Active Problem List   Diagnosis Date Noted   Alcohol use 10/14/2023   Hypokalemia 10/14/2023   Hyponatremia 10/14/2023   Substance abuse (HCC) 10/14/2023   Cardiac murmur 10/14/2023   Methamphetamine use (HCC) 10/14/2023   Acute CVA (cerebrovascular accident) (HCC) 10/13/2023   Spondylolisthesis, lumbosacral region 09/25/2023   Prediabetes 08/20/2023   Hyperlipidemia 08/20/2023   Elevated blood pressure reading in office without diagnosis of hypertension 08/19/2023   Hyperglycemia 08/19/2023   Chronic low back pain without sciatica 08/19/2023   Peripheral neuropathy 08/19/2023   Tobacco use disorder 08/19/2023   Marijuana use, continuous 08/19/2023   PCP:  [name], MD Pharmacy:   [facility] [phone] - [place], [place] - [address]  [place] [address] Phone: [phone] Fax: [phone]     Social Drivers of Health (SDOH) Social History: SDOH Screenings   Food Insecurity: No Food Insecurity (10/14/2023)  Housing: Patient Declined (10/14/2023)  Transportation Needs: Patient Declined (10/14/2023)  Utilities: Patient Declined (10/14/2023)  Depression (PHQ2-9): Medium Risk (08/19/2023)  Tobacco  Use: High Risk (09/25/2023)   SDOH Interventions:     Readmission Risk Interventions     No data to display

## 2023-10-14 NOTE — Evaluation (Signed)
 Occupational Therapy Evaluation/Discharge Patient Details Name: Bruce Yang MRN: [id number] DOB: 06-20-1971 Today's Date: 10/14/2023   History of Present Illness Arrived to the ED on 10/13/23, patient is a 53 y.o. male with medical history significant of polysubstance abuse, chronic low back pain presented to ED with complaint of numbness of his left face/arm/leg which initially began 3 days ago but worsened. MRI results showed 1cm acute ischemic nonhemorrhagic Right thalamic infarct, 2cm acute early subacute ischemic Left basal ganglia infarct.   Clinical Impression   Patient evaluated by Occupational Therapy with no further acute OT needs identified. All education has been completed and the patient has no further questions. Pt is independent at baseline for ADL tasks. Demonstrating decreased sensation in LUE resulting in mild decreased coordination and ability to maintain grasp on objects. Recommend outpatient OT services at discharge to focus on mentioned deficits. No equipment needs. OT is signing off. Thank you for this referral.        If plan is discharge home, recommend the following: A little help with walking and/or transfers;Assistance with cooking/housework;Help with stairs or ramp for entrance;Assist for transportation    Functional Status Assessment  Patient has had a recent decline in their functional status and demonstrates the ability to make significant improvements in function in a reasonable and predictable amount of time.  Equipment Recommendations  None recommended by OT       Precautions / Restrictions Precautions Precautions: Fall Precaution Comments: slight left lateral lean when up walking with therapy. Restrictions Weight Bearing Restrictions Per Provider Order: No      Mobility Bed Mobility Overal bed mobility: Modified Independent             General bed mobility comments: increased time and effort. no physical assist.    Transfers Overall  transfer level: Needs assistance Equipment used: Rolling walker (2 wheels) Transfers: Sit to/from Stand, Bed to chair/wheelchair/BSC Sit to Stand: Contact guard assist     Step pivot transfers: Contact guard assist     General transfer comment: No VC provided for hand placement. Pt reports awareness of slight left lateral lean when walking in hallway. Did not bump into obstacles and was able to manage RW independently. Provided VC and visual demonstration to walk with feet more hip width apart, as pt demonstrated a close stance.      Balance Overall balance assessment: Needs assistance Sitting-balance support: No upper extremity supported, Feet supported Sitting balance-Leahy Scale: Good Sitting balance - Comments: able to don hospital socks while seated on stretcher. Postural control: Left lateral lean (very mild lean noted when walking in hallway) Standing balance support: Bilateral upper extremity supported, During functional activity, Reliant on assistive device for balance Standing balance-Leahy Scale: Fair Standing balance comment: minimal support required for static standing balance.      ADL either performed or assessed with clinical judgement   ADL Overall ADL's : Modified independent            General ADL Comments: Able to don hospital socks while seated at edge of stretcher. Increased time needed due to left hand numbness and decreased coordination.     Vision Baseline Vision/History: 0 No visual deficits Ability to See in Adequate Light: 0 Adequate Patient Visual Report: No change from baseline Vision Assessment?: No apparent visual deficits     Perception Perception: Within Functional Limits       Praxis Praxis: WFL       Pertinent Vitals/Pain Pain Assessment Pain Assessment: No/denies pain  Extremity/Trunk Assessment Upper Extremity Assessment Upper Extremity Assessment: Right hand dominant;RUE deficits/detail;LUE deficits/detail RUE Deficits /  Details: Baseline weakness and ROM restrictions from previous injury. LUE Deficits / Details: Pt able to demonstrate full A/ROM and 5/5 strength in shoulder, elbow, wrist. Intact gross grasp. Reports numbness in entire left arm that extends up into left side of face and mouth. LUE Sensation: decreased light touch LUE Coordination: decreased fine motor   Lower Extremity Assessment Lower Extremity Assessment: Defer to PT evaluation   Cervical / Trunk Assessment Cervical / Trunk Assessment: Normal   Communication Communication Communication: No apparent difficulties   Cognition Arousal: Alert Behavior During Therapy: Flat affect Overall Cognitive Status: Within Functional Limits for tasks assessed                       Home Living Family/patient expects to be discharged to:: Private residence Living Arrangements: Non-relatives/Friends Available Help at Discharge: Friend(s);Available PRN/intermittently Type of Home: House Home Access: Stairs to enter Entrance Stairs-Number of Steps: 4   Home Layout: One level     Bathroom Shower/Tub: Tub/shower unit;Walk-in shower   Bathroom Toilet: Standard (Has both toilet height options)     Home Equipment: Rollator (4 wheels)   Additional Comments: Pt reports that he uses a Rollator occasionally when he feels off balance.      Prior Functioning/Environment Prior Level of Function : Independent/Modified Independent;Driving             OT Problem List: Impaired sensation;Decreased coordination;Impaired UE functional use         OT Goals(Current goals can be found in the care plan section) Acute Rehab OT Goals Patient Stated Goal: to go home OT Goal Formulation: All assessment and education complete, DC therapy  OT Frequency:  1X visit    Co-evaluation PT/OT/SLP Co-Evaluation/Treatment: Yes Reason for Co-Treatment: To address functional/ADL transfers          AM-PAC OT 6 Clicks Daily Activity     Outcome Measure  Help from another person eating meals?: None Help from another person taking care of personal grooming?: None Help from another person toileting, which includes using toliet, bedpan, or urinal?: None Help from another person bathing (including washing, rinsing, drying)?: None Help from another person to put on and taking off regular upper body clothing?: None Help from another person to put on and taking off regular lower body clothing?: None 6 Click Score: 24   End of Session Equipment Utilized During Treatment: Gait belt;Rolling walker (2 wheels)  Activity Tolerance: Patient tolerated treatment well Patient left: in bed;with call bell/phone within reach;with family/visitor present  OT Visit Diagnosis: Unsteadiness on feet (R26.81);Hemiplegia and hemiparesis Hemiplegia - Right/Left: Left Hemiplegia - dominant/non-dominant: Non-Dominant Hemiplegia - caused by: Cerebral infarction                Time: 0910-0929 OT Time Calculation (min): 19 min Charges:  OT General Charges $OT Visit: 1 Visit OT Evaluation $OT Eval Low Complexity: 1 Low  Leita Howell, OTR/L,CBIS  Supplemental OT - [facility] and [facility] Secure Chat Preferred    Geneieve Duell, Leita BIRCH 10/14/2023, 10:33 AM

## 2023-10-14 NOTE — Discharge Instructions (Addendum)
 Dear Bruce Yang,   Congratulations for your interest in quitting smoking!  Find a program that suits you best: when you want to quit, how you need support, where you live, and how you like to learn.    If you're ready to get started TODAY, consider scheduling a visit through [email or web address].  Appointments are available from 8am to 8pm, Monday to Friday.   Most health insurance plans will cover some level of tobacco cessation visits and medications.    Additional Resources: [employer] are also available to help you quit & provide the support you'll need. Many programs are available in both English and Spanish and have a long history of successfully helping people get off and stay off tobacco.    Quit Smoking Apps:  quitSTART at seriousbroker.de QuitGuide?at forgetparking.dk Online education and resources: Smokefree  at borders group.gov Free Telephone Coaching: QuitNow,  Call 1-800-QUIT-NOW ((781)333-5451) or Text- Ready to 936-430-6475 *Quitline [place] has teamed up with Medicaid to offer a free 14 week program    Vaping- Want to Quit? Free 24/7 support. Call [facility]  [place], [place], [place], [place], [place]  [facility]  Additional Discharge Instructions   Please get your medications reviewed and adjusted by your Primary MD.  Please request your Primary MD to go over all Hospital Tests and Procedure/Radiological results at the follow up, please get all Hospital records sent to your Primary MD by signing hospital release before you go home.  If you had Pneumonia of Lung problems at the Hospital: Please get a 2 view Chest X ray done in approximately 4 weeks after hospital discharge or sooner if instructed by your Primary MD.  If you have Congestive Heart Failure: Please call your Cardiologist or Primary MD anytime you have any of the following symptoms:  1) 3 pound weight gain in 24 hours or 5  pounds in 1 week  2) shortness of breath, with or without a dry hacking cough  3) swelling in the hands, feet or stomach  4) if you have to sleep on extra pillows at night in order to breathe  Follow cardiac low salt diet and 1.5 lit/day fluid restriction.  If you have diabetes Accuchecks 4 times/day, Once in AM empty stomach and then before each meal. Log in all results and show them to your primary doctor at your next visit. If any glucose reading is under 80 or above 300 call your primary MD immediately.  If you have Seizure/Convulsions/Epilepsy: Please do not drive, operate heavy machinery, participate in activities at heights or participate in high speed sports until you have seen by Primary MD or a Neurologist and advised to do so again. Per [place] statutes, patients with seizures are not allowed to drive until they have been seizure-free for six months.  Use caution when using heavy equipment or power tools. Avoid working on ladders or at heights. Take showers instead of baths. Ensure the water temperature is not too high on the home water heater. Do not go swimming alone. Do not lock yourself in a room alone (i.e. bathroom). When caring for infants or small children, sit down when holding, feeding, or changing them to minimize risk of injury to the child in the event you have a seizure. Maintain good sleep hygiene. Avoid alcohol.   If you had Gastrointestinal Bleeding: Please ask your Primary MD to check a complete blood count within one week of discharge or at your next visit. Your endoscopic/colonoscopic biopsies that are pending  at the time of discharge, will also need to followed by your Primary MD.  Get Medicines reviewed and adjusted. Please take all your medications with you for your next visit with your Primary MD  Please request your Primary MD to go over all hospital tests and procedure/radiological results at the follow up, please ask your Primary MD to get all  Hospital records sent to his/her office.  If you experience worsening of your admission symptoms, develop shortness of breath, life threatening emergency, suicidal or homicidal thoughts you must seek medical attention immediately by calling 911 or calling your MD immediately  if symptoms less severe.  You must read complete instructions/literature along with all the possible adverse reactions/side effects for all the Medicines you take and that have been prescribed to you. Take any new Medicines after you have completely understood and accpet all the possible adverse reactions/side effects.   Do not drive or operate heavy machinery when taking Pain medications.   Do not take more than prescribed Pain, Sleep and Anxiety Medications  Special Instructions: If you have smoked or chewed Tobacco  in the last 2 yrs please stop smoking, stop any regular Alcohol  and or any Recreational drug use.  Wear Seat belts while driving.  Please note You were cared for by a hospitalist during your hospital stay. If you have any questions about your discharge medications or the care you received while you were in the hospital after you are discharged, you can call the unit and asked to speak with the hospitalist on call if the hospitalist that took care of you is not available. Once you are discharged, your primary care physician will handle any further medical issues. Please note that NO REFILLS for any discharge medications will be authorized once you are discharged, as it is imperative that you return to your primary care physician (or establish a relationship with a primary care physician if you do not have one) for your aftercare needs so that they can reassess your need for medications and monitor your lab values.  You can reach the hospitalist office at phone [phone] or fax [phone]   If you do not have a primary care physician, you can call [phone] for a physician referral.

## 2023-10-14 NOTE — TOC CAGE-AID Note (Signed)
 Transition of Care [facility]) - CAGE-AID Screening   Patient Details  Name: Bruce Yang MRN: [id number] Date of Birth: September 18, 1971  Transition of Care [facility]) CM/SW Contact:    Andrez JULIANNA George, RN Phone Number: 10/14/2023, 3:19 PM   Clinical Narrative: Pt denied the need for inpatient/ outpatient alcohol/ drug counseling   CAGE-AID Screening:    Have You Ever Felt You Ought to Cut Down on Your Drinking or Drug Use?: No Have People Annoyed You By Critizing Your Drinking Or Drug Use?: No Have You Felt Bad Or Guilty About Your Drinking Or Drug Use?: No Have You Ever Had a Drink or Used Drugs First Thing In The Morning to Steady Your Nerves or to Get Rid of a Hangover?: No CAGE-AID Score: 0  Substance Abuse Education Offered: Yes (refused)

## 2023-10-14 NOTE — Progress Notes (Signed)
 This nurse attempted to call this number [phone] for getting report but no one answer

## 2023-10-15 ENCOUNTER — Other Ambulatory Visit (HOSPITAL_COMMUNITY): Payer: Self-pay

## 2023-10-15 ENCOUNTER — Observation Stay (HOSPITAL_COMMUNITY): Payer: BLUE CROSS/BLUE SHIELD

## 2023-10-15 DIAGNOSIS — M79605 Pain in left leg: Secondary | ICD-10-CM | POA: Diagnosis not present

## 2023-10-15 DIAGNOSIS — R7303 Prediabetes: Secondary | ICD-10-CM

## 2023-10-15 DIAGNOSIS — I6381 Other cerebral infarction due to occlusion or stenosis of small artery: Secondary | ICD-10-CM | POA: Diagnosis not present

## 2023-10-15 DIAGNOSIS — I639 Cerebral infarction, unspecified: Secondary | ICD-10-CM | POA: Diagnosis not present

## 2023-10-15 DIAGNOSIS — B20 Human immunodeficiency virus [HIV] disease: Secondary | ICD-10-CM

## 2023-10-15 DIAGNOSIS — Z789 Other specified health status: Secondary | ICD-10-CM | POA: Diagnosis not present

## 2023-10-15 DIAGNOSIS — Z5181 Encounter for therapeutic drug level monitoring: Secondary | ICD-10-CM

## 2023-10-15 LAB — BASIC METABOLIC PANEL
Anion gap: 12 (ref 5–15)
BUN: 16 mg/dL (ref 6–20)
CO2: 24 mmol/L (ref 22–32)
Calcium: 9.8 mg/dL (ref 8.9–10.3)
Chloride: 97 mmol/L — ABNORMAL LOW (ref 98–111)
Creatinine, Ser: 0.94 mg/dL (ref 0.61–1.24)
GFR, Estimated: >60 mL/min (ref >60)
Glucose, Bld: 230 mg/dL — ABNORMAL HIGH (ref 70–99)
Potassium: 4.3 mmol/L (ref 3.5–5.1)
Sodium: 133 mmol/L — ABNORMAL LOW (ref 135–145)

## 2023-10-15 LAB — GLUCOSE, CAPILLARY
Glucose-Capillary: 233 mg/dL — ABNORMAL HIGH (ref 70–99)
Glucose-Capillary: 172 mg/dL — ABNORMAL HIGH (ref 70–99)
Glucose-Capillary: 180 mg/dL — ABNORMAL HIGH (ref 70–99)

## 2023-10-15 LAB — CD4/CD8 (T-HELPER/T-SUPPRESSOR CELL)
CD4 absolute: 292 /uL — ABNORMAL LOW (ref 400–1790)
CD4%: 27.31 % — ABNORMAL LOW (ref 33–65)
CD8 T Cell Abs: 451 /uL (ref 190–1000)
CD8tox: 42.22 % — ABNORMAL HIGH (ref 12–40)
Ratio: 0.65 — ABNORMAL LOW (ref 1.0–3.0)
Total lymphocyte count: 1068 /uL (ref 1000–4000)

## 2023-10-15 LAB — CBC
HCT: 36.2 % — ABNORMAL LOW (ref 39.0–52.0)
Hemoglobin: 12.3 g/dL — ABNORMAL LOW (ref 13.0–17.0)
MCH: 30.2 pg (ref 26.0–34.0)
MCHC: 34 g/dL (ref 30.0–36.0)
MCV: 88.9 fL (ref 80.0–100.0)
Platelets: 258 10*3/uL (ref 150–400)
RBC: 4.07 MIL/uL — ABNORMAL LOW (ref 4.22–5.81)
RDW: 11.8 % (ref 11.5–15.5)
WBC: 3.9 10*3/uL — ABNORMAL LOW (ref 4.0–10.5)
nRBC: 0 % (ref 0.0–0.2)

## 2023-10-15 LAB — RPR: RPR Ser Ql: NONREACTIVE

## 2023-10-15 LAB — HIV-1 RNA QUANT-NO REFLEX-BLD
HIV 1 RNA Quant: 109000 {copies}/mL
LOG10 HIV-1 RNA: 5.037 {Log_copies}/mL

## 2023-10-15 LAB — HIV-1/2 AB - DIFFERENTIATION
HIV 1 Ab: REACTIVE
HIV 2 Ab: NONREACTIVE

## 2023-10-15 MED ORDER — THIAMINE HCL 100 MG PO TABS
100.0000 mg | ORAL_TABLET | Freq: Every day | ORAL | 1 refills | Status: DC
  Filled 2023-10-15 – 2024-01-06 (×2): qty 30, 30d supply, fill #0

## 2023-10-15 MED ORDER — ATORVASTATIN CALCIUM 80 MG PO TABS
80.0000 mg | ORAL_TABLET | Freq: Every day | ORAL | 0 refills | Status: DC
  Filled 2023-10-15: qty 90, 90d supply, fill #0

## 2023-10-15 MED ORDER — ADULT MULTIVITAMIN W/MINERALS CH: 1.0000 | ORAL_TABLET | Freq: Every day | ORAL | Status: DC

## 2023-10-15 MED ORDER — DOVATO 50-300 MG PO TABS
1.0000 | ORAL_TABLET | Freq: Every day | ORAL | 0 refills | Status: DC
  Filled 2023-10-15: qty 30, 30d supply, fill #0

## 2023-10-15 MED ORDER — CLOPIDOGREL BISULFATE 75 MG PO TABS
75.0000 mg | ORAL_TABLET | Freq: Every day | ORAL | 0 refills | Status: DC
  Filled 2023-10-15: qty 88, 88d supply, fill #0

## 2023-10-15 MED ORDER — ASPIRIN 81 MG PO TBEC
81.0000 mg | DELAYED_RELEASE_TABLET | Freq: Every day | ORAL | 0 refills | Status: DC
  Filled 2023-10-15: qty 90, 90d supply, fill #0

## 2023-10-15 MED ORDER — ACETAMINOPHEN 500 MG PO TABS: 500.0000 mg | ORAL_TABLET | Freq: Four times a day (QID) | ORAL | Status: DC | PRN

## 2023-10-15 MED ORDER — DOLUTEGRAVIR-LAMIVUDINE 50-300 MG PO TABS
1.0000 | ORAL_TABLET | Freq: Every day | ORAL | Status: DC
  Administered 2023-10-15: 1 via ORAL
  Filled 2023-10-15: qty 1

## 2023-10-15 MED ORDER — FOLIC ACID 1 MG PO TABS
1.0000 mg | ORAL_TABLET | Freq: Every day | ORAL | 1 refills | Status: DC
  Filled 2023-10-15 – 2024-01-06 (×2): qty 30, 30d supply, fill #0

## 2023-10-15 NOTE — Progress Notes (Signed)
 Venous duplex lower ext  has been completed. Refer to [facility] under chart review to view preliminary results.   10/15/2023  3:30 PM Avanish Cerullo, Gerarda Gunther

## 2023-10-15 NOTE — Evaluation (Signed)
 Speech Language Pathology Evaluation Patient Details Name: Gaylord Seydel MRN: [id number] DOB: 03/13/1971 Today's Date: 10/15/2023 Time: 8992-8976 SLP Time Calculation (min) (ACUTE ONLY): 16 min  Problem List:  Patient Active Problem List   Diagnosis Date Noted   HIV disease (HCC) 10/15/2023   Therapeutic drug monitoring 10/15/2023   Alcohol use 10/14/2023   Hypokalemia 10/14/2023   Hyponatremia 10/14/2023   Substance abuse (HCC) 10/14/2023   Cardiac murmur 10/14/2023   Methamphetamine use (HCC) 10/14/2023   Acute CVA (cerebrovascular accident) (HCC) 10/13/2023   Spondylolisthesis, lumbosacral region 09/25/2023   Prediabetes 08/20/2023   Hyperlipidemia 08/20/2023   Elevated blood pressure reading in office without diagnosis of hypertension 08/19/2023   Hyperglycemia 08/19/2023   Chronic low back pain without sciatica 08/19/2023   Peripheral neuropathy 08/19/2023   Tobacco use disorder 08/19/2023   Marijuana use, continuous 08/19/2023   Past Medical History:  Past Medical History:  Diagnosis Date   Recurrent boils of anus    Past Surgical History: No past surgical history on file. HPI:  53 yr old arrived to the ED on 10/13/23, patient is a 53 y.o. male with medical history significant of polysubstance abuse, chronic low back pain presented to ED with complaint of numbness of his left face/arm/leg which initially began 3 days ago but worsened. MRI results showed 1cm acute ischemic nonhemorrhagic Right thalamic infarct, 2cm acute early subacute ischemic Left basal ganglia infarct.   Assessment / Plan / Recommendation Clinical Impression  Pt is currently unemployed, completed his GED and lives with significant other. He states that his thinking has decreased since stroke and his partner agrees. He received a 14/30 on the SLUMS examination with impairments in working memory (recalled 0/5 words storage but mosty retrieval deficits), problem solving with clock drawing, divergent naming  and forgetting what he had already named, stating digits backwards and recalling information in paragraphs. Pt and significant other agree with continue tx while in acute care and in outpatient at discharge. Offered strategies to facilitate memory.    SLP Assessment  SLP Recommendation/Assessment: Patient needs continued Speech Lanaguage Pathology Services SLP Visit Diagnosis: Cognitive communication deficit (R41.841)    Recommendations for follow up therapy are one component of a multi-disciplinary discharge planning process, led by the attending physician.  Recommendations may be updated based on patient status, additional functional criteria and insurance authorization.    Follow Up Recommendations  Outpatient SLP    Assistance Recommended at Discharge     Functional Status Assessment Patient has had a recent decline in their functional status and demonstrates the ability to make significant improvements in function in a reasonable and predictable amount of time.  Frequency and Duration min 2x/week  2 weeks      SLP Evaluation Cognition  Overall Cognitive Status: Impaired/Different from baseline Arousal/Alertness: Awake/alert Orientation Level: Oriented to person;Oriented to situation;Oriented to time Year: 2025 Day of Week: Correct Attention: Sustained Sustained Attention: Appears intact Memory: Impaired Memory Impairment: Storage deficit;Retrieval deficit (0/5 word recall) Awareness: Impaired Awareness Impairment: Emergent impairment Problem Solving: Impaired Problem Solving Impairment: Functional basic Safety/Judgment: Impaired       Comprehension  Auditory Comprehension Overall Auditory Comprehension: Appears within functional limits for tasks assessed Visual Recognition/Discrimination Discrimination: Not tested Reading Comprehension Reading Status: Not tested    Expression Expression Primary Mode of Expression: Verbal Verbal Expression Overall Verbal Expression:  Appears within functional limits for tasks assessed Initiation: No impairment Level of Generative/Spontaneous Verbalization: Conversation Repetition:  (NT) Naming: Impairment Divergent:  (7 animals one minute) Written  Expression Dominant Hand: Right Written Expression: Not tested   Oral / Motor  Oral Motor/Sensory Function Overall Oral Motor/Sensory Function: Within functional limits Motor Speech Overall Motor Speech: Appears within functional limits for tasks assessed Respiration: Within functional limits Phonation: Normal Resonance: Within functional limits Articulation: Within functional limitis Intelligibility: Intelligible Motor Planning: Witnin functional limits Motor Speech Errors: Not applicable            Dustin Olam Bull 10/15/2023, 10:44 AM

## 2023-10-15 NOTE — Progress Notes (Signed)
 Physical Therapy Treatment Patient Details Name: Bruce Yang MRN: [id number] DOB: [birth year] Today's Date: 10/15/2023   History of Present Illness 53 y.o. male presented 10/13/23 to [facility] with complaint of numbness of his left face/arm/leg. MRI: acute ischemic nonhemorrhagic R thalamic infarct, subacute/chronic L basal ganglia infarct. Transfer to [facility] 10/14/23 PMH; polysubstance abuse, chronic low back pain    PT Comments  Pt seated up EOB on arrival and agreeable to session with continued progress towards acute goals. Pt demonstrating transfers and gait with RW for support with grossly CGA for safety. Pt with x1 mild L lateral LOB at start of ambulation with pt able to self correct. Pt continues to benefit from cues for midline posture and improved gait mechanics with focus on wider BOS and symmetrical step lengths. Pt with some c/o L hamstring tightness throughout session, pt educated and instructed on hamstring/piriformis stretching to perform in sitting and supine with pt able to demo back with cues and endorsing some relief. Pt was educated on continued walker use to maximize functional independence, safety, and decrease risk for falls. Current plan remains appropriate to address deficits and maximize functional independence and safety.  Pt continues to benefit from skilled PT services to progress toward functional mobility goals.     If plan is discharge home, recommend the following: A little help with walking and/or transfers;Assistance with cooking/housework;Assist for transportation;Help with stairs or ramp for entrance   Can travel by private vehicle        Equipment Recommendations  None recommended by PT    Recommendations for Other Services       Precautions / Restrictions Precautions Precautions: Fall Precaution Comments: slight left lateral lean when up walking with therapy. Restrictions Weight Bearing Restrictions Per Provider Order: No     Mobility  Bed Mobility Overal  bed mobility: Modified Independent             General bed mobility comments: increased time and effort. no physical assist.    Transfers Overall transfer level: Needs assistance Equipment used: Rolling walker (2 wheels) Transfers: Sit to/from Stand, Bed to chair/wheelchair/BSC Sit to Stand: Contact guard assist           General transfer comment: CGA for safety    Ambulation/Gait Ambulation/Gait assistance: Contact guard assist Gait Distance (Feet): 150 Feet Assistive device: Rolling walker (2 wheels) Gait Pattern/deviations: Step-through pattern, Narrow base of support Gait velocity: decr     General Gait Details: feet very close together, cues to widen , decreased step length bilaterally, mild lateral LOB within first 3' with pt able to self corect   Stairs             Wheelchair Mobility     Tilt Bed    Modified Rankin (Stroke Patients Only)       Balance Overall balance assessment: Needs assistance Sitting-balance support: No upper extremity supported, Feet supported Sitting balance-Leahy Scale: Good Sitting balance - Comments: able to don hospital socks while seated on stretcher. Postural control: Left lateral lean (very mild lean noted when walking in hallway) Standing balance support: During functional activity, No upper extremity supported Standing balance-Leahy Scale: Fair Standing balance comment: patient  required  support for dynamic balance, required Rw for amb.                            Cognition Arousal: Alert Behavior During Therapy: Flat affect Overall Cognitive Status: Impaired/Different from baseline  General Comments: soem slow processing noted        Exercises Other Exercises Other Exercises: seated and supine hamstring/piriformis stretches    General Comments        Pertinent Vitals/Pain Pain Assessment Pain Assessment: Faces Faces Pain Scale: Hurts a  little bit Pain Location: L hamstring Pain Descriptors / Indicators: Sore, Tightness Pain Intervention(s): Monitored during session, Limited activity within patient's tolerance    Home Living                          Prior Function            PT Goals (current goals can now be found in the care plan section) Acute Rehab PT Goals Patient Stated Goal: go home, drive PT Goal Formulation: With patient/family Time For Goal Achievement: 10/28/23 Progress towards PT goals: Progressing toward goals    Frequency    Min 1X/week      PT Plan      Co-evaluation              AM-PAC PT 6 Clicks Mobility   Outcome Measure  Help needed turning from your back to your side while in a flat bed without using bedrails?: None Help needed moving from lying on your back to sitting on the side of a flat bed without using bedrails?: None Help needed moving to and from a bed to a chair (including a wheelchair)?: None Help needed standing up from a chair using your arms (e.g., wheelchair or bedside chair)?: A Little Help needed to walk in hospital room?: A Little Help needed climbing 3-5 steps with a railing? : A Lot 6 Click Score: 20    End of Session Equipment Utilized During Treatment: Gait belt Activity Tolerance: Patient tolerated treatment well Patient left: in bed;with call bell/phone within reach;with family/visitor present Nurse Communication: Mobility status PT Visit Diagnosis: Unsteadiness on feet (R26.81);Other symptoms and signs involving the nervous system (R29.898);Difficulty in walking, not elsewhere classified (R26.2);History of falling (Z91.81);Pain     Time: 8772-8754 PT Time Calculation (min) (ACUTE ONLY): 18 min  Charges:    $Gait Training: 8-22 mins PT General Charges $$ ACUTE PT VISIT: 1 Visit                     Makahla Kiser R. PTA Acute Rehabilitation Services Office: [phone]   Therisa CHRISTELLA Boor 10/15/2023, 4:37 PM

## 2023-10-15 NOTE — Progress Notes (Signed)
 PT c/o new alteration in sensation on this RLE. States it feels numb but is able to tell me when I am touching him. MD notified and to bedside to see patient. Pt then states his RLE is in pain but when asked to point to area, he points to his left upper leg. Wife states patient has been getting his left and right confused. MD ordered US  to rule out DVT.

## 2023-10-15 NOTE — Discharge Summary (Signed)
 Physician Discharge Summary  [name] FMW:[id number] DOB: 1971-05-07  PCP: [name], MD  Admitted from: Home Discharged to: Home  Admit date: 10/13/2023 Discharge date: 10/15/2023  Recommendations for Outpatient Follow-up:    Follow-up Information     [facility] at [facility]. Schedule an appointment as soon as possible for a visit.   Specialty: Rehabilitation Contact information: [address] [place]  [address] [phone]        [facility]. Schedule an appointment as soon as possible for a visit in 1 month(s).   Specialty: Neurology Why: stroke clinic Contact information: [address] [name]  [address] [phone]        [name], MD. Schedule an appointment as soon as possible for a visit in 1 week(s).   Specialty: Family Medicine Why: Repeat labs (CBC & CMP), Hospital Follow Up Contact information: [address] [place] [address] [phone]                  Home Health: Outpatient PT, OT and SLP arranged by TOC.    Equipment/Devices:     Horticulturist, Commercial  (From admission, onward)           Start     Ordered   10/14/23 1637  For home use only DME Walker rolling  Once       Question Answer Comment  Walker: With 5 Inch Wheels   Patient needs a walker to treat with the following condition Weakness      10/14/23 1636             Discharge Condition: Improved and stable.   Code Status: Full Code Diet recommendation:  Discharge Diet Orders (From admission, onward)     Start     Ordered   10/15/23 0000  Diet - low sodium heart healthy        10/15/23 1629   10/15/23 0000  Diet Carb Modified        10/15/23 1629             Discharge Diagnoses:  Principal Problem:   Acute CVA (cerebrovascular accident) [facility]) Active Problems:   Hyperglycemia   Alcohol use    Hypokalemia   Hyponatremia   Substance abuse (HCC)   Cardiac murmur   HIV disease (HCC)   Therapeutic drug monitoring   Brief Summary: 53 y.o. male with medical history significant of polysubstance abuse, chronic low back pain presented to ED with complaint of numbness of his left face/arm/leg X 3 days.  In the ED, vital signs fairly stable.  Labs showed ethanol level 164, UDS positive for amphetamines and THC.  CT head showing new low density within the anterior right thalamus consistent with an infarction, exact age indeterminate. Brain MRI showing a 1 cm acute ischemic nonhemorrhagic right thalamic infarct and a 2 cm acute to early subacute ischemic nonhemorrhagic left basal ganglia infarct. MRI of C-spine negative for acute abnormality. Neurology recommended admission to [facility] for stroke workup. Patient passed his swallow study in the ED. TRH called to admit.   Assessment and plan:  Acute CVA Hyperlipidemia History as noted above. Neurology consulted and assisted with evaluation and management. As per neurology, acute right thalamic infarct and subacute/chronic left basal ganglia/coronary radiator infarct, secondary to small vessel disease with multiple CT head: Right thalamic infarct, old left BG infarct Brain MRI showing a 1 cm acute ischemic nonhemorrhagic right  thalamic infarct and a 2 cm acute to early subacute ischemic nonhemorrhagic left basal ganglia infarct.  As per neurology review, there was left midbrain and pontine wallerian degeneration, so the left BG/CR infarct likely to be more chronic. CTA head and neck: As per neurology's review, right ICA petrous segment moderate stenosis at the most. A1c was 6.3 on 08/2023, LDL 208 Echo: LVEF 40-45%.  PCP to consider outpatient cardiology evaluation for low EF. UDS positive for amphetamines and THC. Telemetry shows sinus rhythm without arrhythmias. As per neurology recommendations and my discussion with Dr. Jerri on day of  discharge, not on antithrombotics PTA, now on aspirin  81 Mg daily + Plavix  75 Mg daily x 3 months and then aspirin  alone given right ICA petrous/siphon segment stenosis.  Patient has been counseled extensively regarding compliance with all aspects of his medical care and abstinence from substance abuse.  He verbalized understanding Per therapy recommendations, outpatient PT, OT and SLP. Left lower extremity pain, unclear etiology,?  Neuropathic pain related to acute stroke.  Left lower extremity venous Doppler negative for DVT.  No acute findings on physical exam.  Right intracranial ICA stenosis CTA head and neck findings as noted above Per neurology, no intervention other than antiplatelet management as noted above.  Continue statins.   Alcohol use disorder Patient reported consuming several alcoholic beverages PTA Ethanol level 164 on admission No overt withdrawal and CIWA score 0 Continue multivitamins, thiamine  and folate supplements Abstinence counseled.   Hyperglycemia due to prediabetes Glucose 312 without signs of DKA Last A1c 6.3 on 08/19/2023 Placed on sensitive sliding scale insulin  ACHS.  Carb modified diet. Close outpatient follow-up with PCP.  Mixed hyperlipidemia LDL 208, goal <70 Not on statins PTA, now on atorvastatin  80 Mg daily.   Polysubstance abuse (tobacco, alcohol, amphetamines, THC and cocaine use in the past) UDS positive for THC and amphetamines (not seen on his home medication list) TOC consulted for substance abuse counseling. Cessation counseled.  HIV infection ID consultation appreciated HIV screening test positive. CD4: 292, CD4 percentage: 27.31. CD8 total: 451 As per ID, started on Dovato  and recommended that girlfriend should get tested. Has follow-up appointment on 1/15 with Dr. Fleeta Rothman.   Cardiac murmur TTE results as below, shows mild aortic valve stenosis and trivial mitral valve regurgitation.  Outpatient follow-up.     Consultations: Neurology Infectious disease  Procedures: As above   Discharge Instructions  Discharge Instructions     Ambulatory referral to Neurology   Complete by: As directed    Follow up with stroke clinic NP at [facility] in about 4-6 weeks. Thanks.   Ambulatory referral to Occupational Therapy   Complete by: As directed    Ambulatory referral to Physical Therapy   Complete by: As directed    Ambulatory referral to Speech Therapy   Complete by: As directed    Ambulatory referral to Speech Therapy   Complete by: As directed    Call MD for:   Complete by: As directed    Recurrent strokelike symptoms.   Call MD for:  difficulty breathing, headache or visual disturbances   Complete by: As directed    Call MD for:  extreme fatigue   Complete by: As directed    Call MD for:  persistant dizziness or light-headedness   Complete by: As directed    Call MD for:  persistant nausea and vomiting   Complete by: As directed    Call MD for:  severe uncontrolled pain   Complete by:  As directed    Call MD for:  temperature >100.4   Complete by: As directed    Diet - low sodium heart healthy   Complete by: As directed    Diet Carb Modified   Complete by: As directed    Increase activity slowly   Complete by: As directed         Medication List     STOP taking these medications    docusate sodium  100 MG capsule Commonly known as: COLACE   ibuprofen  800 MG tablet Commonly known as: ADVIL    lidocaine  5 % Commonly known as: Lidoderm    loperamide  2 MG capsule Commonly known as: IMODIUM    meloxicam  15 MG tablet Commonly known as: Mobic        TAKE these medications    acetaminophen  500 MG tablet Commonly known as: TYLENOL  Take 1-2 tablets (500-1,000 mg total) by mouth every 6 (six) hours as needed for mild pain (pain score 1-3) or moderate pain (pain score 4-6). What changed:  how much to take reasons to take this   aspirin  EC 81 MG tablet Take 1 tablet (81  mg total) by mouth daily. Swallow whole. Start taking on: October 16, 2023   atorvastatin  80 MG tablet Commonly known as: LIPITOR  Take 1 tablet (80 mg total) by mouth daily. Start taking on: October 16, 2023   clopidogrel  75 MG tablet Commonly known as: PLAVIX  Take 1 tablet (75 mg total) by mouth daily. Start taking on: October 16, 2023   Dovato  50-300 MG tablet Generic drug: dolutegravir -lamiVUDine  Take 1 tablet by mouth daily.   folic acid  1 MG tablet Commonly known as: FOLVITE  Take 1 tablet (1 mg total) by mouth daily. Start taking on: October 16, 2023   multivitamin with minerals Tabs tablet Take 1 tablet by mouth daily.   thiamine  100 MG tablet Commonly known as: VITAMIN B1 Take 1 tablet (100 mg total) by mouth daily. Start taking on: October 16, 2023       Allergies  Allergen Reactions   Antihistamines, Chlorpheniramine-Type     Affects Prostate       Procedures/Studies: VAS US  LOWER EXTREMITY VENOUS (DVT) Result Date: 10/15/2023  Lower Venous DVT Study Patient Name:  TYMEIR WEATHINGTON  Date of Exam:   10/15/2023 Medical Rec #: [id number]         Accession #:    [id number] Date of Birth: 1970/11/10         Patient Gender: M Patient Age:   22 years Exam Location:  [facility] Procedure:      VAS US  LOWER EXTREMITY VENOUS (DVT) Referring Phys: Zilphia Kozinski --------------------------------------------------------------------------------  Indications: Pain in left posterior lower thigh and behind the left knee for 1 day.  Comparison Study: No priors. Performing Technologist: Ricka Holland RDMS, RVT  Examination Guidelines: A complete evaluation includes B-mode imaging, spectral Doppler, color Doppler, and power Doppler as needed of all accessible portions of each vessel. Bilateral testing is considered an integral part of a complete examination. Limited examinations for reoccurring indications may be performed as noted. The reflux portion of the exam is performed with  the patient in reverse Trendelenburg.  +-----+---------------+---------+-----------+----------+--------------+ RIGHTCompressibilityPhasicitySpontaneityPropertiesThrombus Aging +-----+---------------+---------+-----------+----------+--------------+ CFV  Full           Yes      Yes                                 +-----+---------------+---------+-----------+----------+--------------+ SFJ  Full                                                        +-----+---------------+---------+-----------+----------+--------------+   +---------+---------------+---------+-----------+----------+--------------+ LEFT     CompressibilityPhasicitySpontaneityPropertiesThrombus Aging +---------+---------------+---------+-----------+----------+--------------+ CFV      Full           Yes      Yes                                 +---------+---------------+---------+-----------+----------+--------------+ SFJ      Full                                                        +---------+---------------+---------+-----------+----------+--------------+ FV Prox  Full                                                        +---------+---------------+---------+-----------+----------+--------------+ FV Mid   Full                                                        +---------+---------------+---------+-----------+----------+--------------+ FV DistalFull                                                        +---------+---------------+---------+-----------+----------+--------------+ PFV      Full                                                        +---------+---------------+---------+-----------+----------+--------------+ POP      Full           Yes      Yes                                 +---------+---------------+---------+-----------+----------+--------------+ PTV      Full                                                         +---------+---------------+---------+-----------+----------+--------------+ PERO     Full                                                        +---------+---------------+---------+-----------+----------+--------------+  Summary: RIGHT: - No evidence of common femoral vein obstruction.   LEFT: - There is no evidence of deep vein thrombosis in the lower extremity.  - No cystic structure found in the popliteal fossa.  *See table(s) above for measurements and observations.    Preliminary    CT ANGIO HEAD NECK W WO CM Result Date: 10/14/2023 CLINICAL DATA:  Stroke/TIA, determined embolic source. EXAM: CT ANGIOGRAPHY HEAD AND NECK WITH AND WITHOUT CONTRAST TECHNIQUE: Multidetector CT imaging of the head and neck was performed using the standard protocol during bolus administration of intravenous contrast. Multiplanar CT image reconstructions and MIPs were obtained to evaluate the vascular anatomy. Carotid stenosis measurements (when applicable) are obtained utilizing NASCET criteria, using the distal internal carotid diameter as the denominator. RADIATION DOSE REDUCTION: This exam was performed according to the departmental dose-optimization program which includes automated exposure control, adjustment of the mA and/or kV according to patient size and/or use of iterative reconstruction technique. CONTRAST:  75mL OMNIPAQUE  IOHEXOL  350 MG/ML SOLN COMPARISON:  MRI of the brain October 13, 2023. FINDINGS: CT HEAD FINDINGS Brain: Areas of acute/subacute infarcts are better demonstrated on recent MRI. Remote infarcts in the thalami and left basal. No hydrocephalus, hemorrhage, extra-axial collection or mass effect. Vascular: Calcified plaques in the bilateral carotid siphons. Skull: Normal. Negative for fracture or focal lesion. Sinuses/Orbits: Mucosal thickening of ethmoid cells and left maxillary sinus. Other: None. Review of the MIP images confirms the above findings CTA NECK FINDINGS Aortic arch: 4 vessel  aortic arch with the left vertebral artery originating directly from the aortic arch distal to the left subclavian artery. Imaged portion shows no evidence of aneurysm or dissection. Atherosclerotic changes with mixed density plaques are seen and along the aortic arch and at the origin of major neck arteries. No significant stenosis. Right carotid system: Atherosclerotic changes are seen along the right common carotid artery and cervical right internal carotid artery. There is diffuse decrease in caliber of the cervical right ICA without focal high-grade stenosis. Left carotid system: Mild atherosclerotic changes of the left carotid bifurcation without hemodynamically significant stenosis. Vertebral arteries: Right dominant. Mild atherosclerotic changes at the origin of the vertebral arteries without hemodynamically significant stenosis. There is increased tortuosity of the V1 segment of the right vertebral artery and the P2 segment of the left vertebral artery. No evidence of dissection, stenosis (50% or greater), or occlusion. Skeleton: Degenerative changes of the cervical spine. Periapical lucency at the left mandibular canine and first and second molar teeth. Bilateral maxillary and mandibularis third molar teeth impaction. Other neck: Prominent adenoid tissue. Small thyroid  nodules measuring up to 7 mm. No follow-up imaging recommended. Upper chest: Negative. Review of the MIP images confirms the above findings CTA HEAD FINDINGS Anterior circulation: Severe stenosis at the petrous segment of the right ICA and moderate at the paraclinoid segment. Atherosclerotic plaques of left carotid siphon without hemodynamically significant stenosis. Hypoplastic/aplastic right A1/ACA segment with both A2 segments supplied by the left A1/ACA segment. Otherwise, the bilateral ACA and MCA vascular trees are patent without high-grade stenosis. Posterior circulation: Patent bilateral intracranial vertebral arteries. Mild luminal  irregularity of the basilar are consistent with intracranial atherosclerotic disease without hemodynamically significant stenosis. Patent posterior cerebral arteries with focal moderate stenosis at the right P1-P2 junction and mild stenosis at the proximal P2 segment of the left PCA. Venous sinuses: As permitted by contrast timing, patent. Anatomic variants: As above. Review of the MIP images confirms the above findings IMPRESSION: 1. Areas of acute/subacute infarcts  are better demonstrated on recent MRI. 2. Remote infarcts in the thalami and left basal ganglia. 3. Severe stenosis at the petrous segment of the right ICA and moderate at the paraclinoid segment. 4. Diffuse decrease in caliber of the cervical right ICA related to upstream stenosis. 5. Focal moderate stenosis at the right P1-P2 junction and mild stenosis at the proximal P2 segment of the left PCA. 6. Aortic atherosclerosis. Aortic Atherosclerosis (ICD10-I70.0). Electronically Signed   By: Katyucia  de Macedo Rodrigues M.D.   On: 10/14/2023 16:17   ECHOCARDIOGRAM COMPLETE Result Date: 10/14/2023    ECHOCARDIOGRAM REPORT   Patient Name:   Bruce Yang Date of Exam: 10/14/2023 Medical Rec #:  [id number]        Height:       66.0 in Accession #:    [id number]       Weight:       159.0 lb Date of Birth:  10/09/1970        BSA:          1.814 m Patient Age:    52 years         BP:           167/93 mmHg Patient Gender: M                HR:           81 bpm. Exam Location:  Inpatient Procedure: 2D Echo, Cardiac Doppler and Color Doppler Indications:    Stroke  History:        Patient has no prior history of Echocardiogram examinations.  Sonographer:    Jayson Gaskins Referring Phys: [id number] VASUNDHRA RATHORE IMPRESSIONS  1. Left ventricular ejection fraction, by estimation, is 40 to 45%. The left ventricle has mildly decreased function. The left ventricle demonstrates global hypokinesis. Indeterminate diastolic filling due to E-A fusion.  2. Right ventricular  systolic function is normal. The right ventricular size is mildly enlarged. Tricuspid regurgitation signal is inadequate for assessing PA pressure.  3. Left atrial size was mildly dilated.  4. The mitral valve is grossly normal. Trivial mitral valve regurgitation. No evidence of mitral stenosis.  5. The aortic valve is calcified. Aortic valve regurgitation is mild. Mild aortic valve stenosis. Aortic valve area, by VTI measures 1.13 cm. Aortic valve mean gradient measures 13.0 mmHg. Aortic valve Vmax measures 2.53 m/s.  6. The inferior vena cava is normal in size with greater than 50% respiratory variability, suggesting right atrial pressure of 3 mmHg. FINDINGS  Left Ventricle: Left ventricular ejection fraction, by estimation, is 40 to 45%. The left ventricle has mildly decreased function. The left ventricle demonstrates global hypokinesis. The left ventricular internal cavity size was normal in size. There is  no left ventricular hypertrophy. Indeterminate diastolic filling due to E-A fusion. Right Ventricle: The right ventricular size is mildly enlarged. No increase in right ventricular wall thickness. Right ventricular systolic function is normal. Tricuspid regurgitation signal is inadequate for assessing PA pressure. Left Atrium: Left atrial size was mildly dilated. Right Atrium: Right atrial size was normal in size. Pericardium: There is no evidence of pericardial effusion. Mitral Valve: The mitral valve is grossly normal. Trivial mitral valve regurgitation. No evidence of mitral valve stenosis. MV peak gradient, 9.7 mmHg. The mean mitral valve gradient is 4.0 mmHg. Tricuspid Valve: The tricuspid valve is grossly normal. Tricuspid valve regurgitation is trivial. No evidence of tricuspid stenosis. Aortic Valve: The aortic valve is calcified. Aortic valve regurgitation is mild. Mild aortic stenosis is  present. Aortic valve mean gradient measures 13.0 mmHg. Aortic valve peak gradient measures 25.6 mmHg. Aortic  valve area, by VTI measures 1.13 cm. Pulmonic Valve: The pulmonic valve was grossly normal. Pulmonic valve regurgitation is trivial. No evidence of pulmonic stenosis. Aorta: The aortic root is normal in size and structure. Venous: The inferior vena cava is normal in size with greater than 50% respiratory variability, suggesting right atrial pressure of 3 mmHg. IAS/Shunts: The atrial septum is grossly normal.  LEFT VENTRICLE PLAX 2D LVIDd:         4.40 cm   Diastology LVIDs:         3.70 cm   LV e' medial:    6.74 cm/s LV PW:         1.20 cm   LV E/e' medial:  24.8 LV IVS:        1.00 cm   LV e' lateral:   8.59 cm/s LVOT diam:     2.00 cm   LV E/e' lateral: 19.4 LV SV:         55 LV SV Index:   30 LVOT Area:     3.14 cm  RIGHT VENTRICLE RV S prime:     14.60 cm/s TAPSE (M-mode): 2.0 cm LEFT ATRIUM             Index        RIGHT ATRIUM           Index LA Vol (A2C):   43.3 ml 23.87 ml/m  RA Area:     12.00 cm LA Vol (A4C):   72.3 ml 39.86 ml/m  RA Volume:   27.60 ml  15.21 ml/m LA Biplane Vol: 57.3 ml 31.59 ml/m  AORTIC VALVE AV Area (Vmax):    1.11 cm AV Area (Vmean):   1.32 cm AV Area (VTI):     1.13 cm AV Vmax:           253.00 cm/s AV Vmean:          169.000 cm/s AV VTI:            0.485 m AV Peak Grad:      25.6 mmHg AV Mean Grad:      13.0 mmHg LVOT Vmax:         89.20 cm/s LVOT Vmean:        71.200 cm/s LVOT VTI:          0.175 m LVOT/AV VTI ratio: 0.36  AORTA Ao Root diam: 2.60 cm MITRAL VALVE MV Area (PHT): 4.96 cm     SHUNTS MV Area VTI:   1.62 cm     Systemic VTI:  0.18 m MV Peak grad:  9.7 mmHg     Systemic Diam: 2.00 cm MV Mean grad:  4.0 mmHg MV Vmax:       1.56 m/s MV Vmean:      86.2 cm/s MV Decel Time: 153 msec MV E velocity: 167.00 cm/s MV A velocity: 52.20 cm/s MV E/A ratio:  3.20 Darryle Decent MD Electronically signed by Darryle Decent MD Signature Date/Time: 10/14/2023/3:21:36 PM    Final    MR Cervical Spine Wo Contrast Result Date: 10/13/2023 CLINICAL DATA:  Initial evaluation for  acute myelopathy. Left shoulder pain. EXAM: MRI CERVICAL SPINE WITHOUT CONTRAST TECHNIQUE: Multiplanar, multisequence MR imaging of the cervical spine was performed. No intravenous contrast was administered. COMPARISON:  Prior radiograph from earlier the same day. FINDINGS: Alignment: Examination degraded by motion artifact, limiting assessment. Specifically, the axial  sequences are markedly degraded, limiting assessment of potential foraminal encroachment. Vertebral bodies normally aligned with preservation of the normal cervical lordosis. No significant listhesis. Vertebrae: Vertebral body height maintained without acute or chronic fracture. Bone marrow signal intensity overall within normal limits. No visible worrisome osseous lesions. No significant abnormal marrow edema. Cord: Grossly normal signal and morphology. Posterior Fossa, vertebral arteries, paraspinal tissues: Paraspinous soft tissues demonstrate no visible acute or significant abnormality. Disc levels: C2-C3: Unremarkable. C3-C4: Central disc protrusion indents the ventral thecal sac, contacting and mildly flattening the ventral cord. No visible cord signal changes. Mild spinal stenosis. Foramina appear grossly patent. C4-C5: Mild disc bulge with uncovertebral spurring. No significant spinal stenosis. Mild to moderate left C5 foraminal stenosis. Right neural foramen appears grossly patent. C5-C6: Degenerate intervertebral disc space narrowing with diffuse disc osteophyte complex. No significant spinal stenosis. Moderate to severe right with mild left C6 foraminal narrowing. C6-C7: Mild disc bulge with uncovertebral spurring. No significant spinal stenosis. Mild left with moderate right C7 foraminal stenosis. C7-T1:  Unremarkable. IMPRESSION: 1. Motion degraded exam. 2. No definite acute abnormality within the cervical spine or spinal cord. 3. Central disc protrusion at C3-4 with resultant mild spinal stenosis. 4. Multifactorial degenerative changes  with resultant multilevel foraminal narrowing as above. Notable findings include mild to moderate left C5 foraminal stenosis, moderate to severe right with mild left C6 foraminal narrowing, with moderate right C7 foraminal stenosis. Electronically Signed   By: Morene Hoard M.D.   On: 10/13/2023 19:59   MR BRAIN WO CONTRAST Result Date: 10/13/2023 CLINICAL DATA:  Initial evaluation for acute neuro deficit, stroke suspected. EXAM: MRI HEAD WITHOUT CONTRAST TECHNIQUE: Multiplanar, multiecho pulse sequences of the brain and surrounding structures were obtained without intravenous contrast. COMPARISON:  Prior CT from earlier the same day. FINDINGS: Brain: Cerebral volume within normal limits. Mild chronic microvascular ischemic disease for age. Remote lacunar infarcts present about the bilateral thalami. Associated wallerian degeneration extending into the cerebral peduncles and brainstem noted. 1 cm focus of restricted diffusion involving the lateral right thalamus, consistent with an acute ischemic nonhemorrhagic infarct (series 10, image 24). Additional 2 cm focus of diffusion signal abnormality involving the left basal ganglia, also consistent with an ischemic infarct, slightly older in appearance and acute to early subacute in nature. No associated hemorrhage or mass effect. No other evidence for acute or subacute ischemia. Gray-white matter differentiation otherwise maintained. No other acute or chronic intracranial blood products. No mass lesion, midline shift or mass effect. No hydrocephalus or extra-axial fluid collection. Pituitary gland suprasellar region within normal limits. Vascular: Major intracranial vascular flow voids are maintained. Skull and upper cervical spine: Craniocervical junction within normal limits. Bone marrow signal intensity normal. No scalp soft tissue abnormality. Sinuses/Orbits: Globes and orbital soft tissues within normal limits. Scattered mucosal thickening noted about the  ethmoidal air cells and maxillary sinuses. No significant mastoid effusion. Other: None. IMPRESSION: 1. 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 2. 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 3. 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 Electronically Signed   By: Morene Hoard M.D.   On: 10/13/2023 19:54   CT Head Wo Contrast Result Date: 10/13/2023 CLINICAL DATA:  Numbness in the left arm beginning today. EXAM: CT HEAD WITHOUT CONTRAST TECHNIQUE: Contiguous axial images were obtained from the base of the skull through the vertex without intravenous contrast. RADIATION DOSE REDUCTION: This exam was performed according to the  departmental dose-optimization program which includes automated exposure control, adjustment of the mA and/or kV according to patient size and/or use of iterative reconstruction technique. COMPARISON:  01/07/2023 FINDINGS: Brain: No focal abnormality affects the brainstem or cerebellum. New low-density seen within the anterior right thalamus consistent with an infarction. This is age indeterminate. On the left, there are old appearing infarctions in the region of the lateral thalamus/radiating white matter tracts. None of these findings were present in a parole. No cortical or large vessel territory infarction. No mass, hemorrhage, hydrocephalus or extra-axial collection. Vascular: There is atherosclerotic calcification of the major vessels at the base of the brain. Skull: Negative Sinuses/Orbits: Clear/normal Other: None IMPRESSION: 1. New since April low-density within the anterior right thalamus consistent with an infarction. This is exact age indeterminate. 2. Old appearing infarctions in the region of the lateral left thalamus/radiating white matter tracts. 3. No cortical or large vessel territory infarction. Electronically Signed   By: Oneil Officer M.D.    On: 10/13/2023 17:11   XR Cervical Spine With Flex & Extend Result Date: 10/13/2023 XRs of the cervical spine from 10/13/2023 were independently reviewed and interpreted, showing disc height loss with anterior osteophyte formation at C5/6.  No other significant degenerative changes seen.  No evidence of instability on flexion/extension views.  No fracture or dislocation seen.   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  Anterior listhesis at L5/S1 that shifts about 0.5 mm between flexion and extension views.  Disc height loss at L5/S1.  No other significant degenerative changes seen.  No fracture or dislocation seen.     Subjective: Patient seen along with his girlfriend at bedside.  Reports some left facial numbness and left forearm numbness and ongoing pain behind left thigh.  Numbness better than on admission.  Denies long distance travel.  Discharge Exam:  Vitals:   10/15/23 0347 10/15/23 0741 10/15/23 1118 10/15/23 1520  BP: (!) 161/81 (!) 155/83 138/72 (!) 152/83  Pulse: 69 70 79 81  Resp: 18 18 18 18   Temp: 98 F (36.7 C) 98.1 F (36.7 C) 97.9 F (36.6 C) 98 F (36.7 C)  TempSrc: Oral Oral Oral Oral  SpO2: 98% 99% 99% 94%    General: Young male, moderately built and nourished lying comfortably in bed along with his girlfriend at bedside. Cardiovascular: S1 & S2 heard, RRR, S1/S2 +. No murmurs, rubs, gallops or clicks. No JVD or pedal edema.  Telemetry personally reviewed: Sinus rhythm. Respiratory: Clear to auscultation without wheezing, rhonchi or crackles. No increased work of breathing. Abdominal:  Non distended, non tender & soft. No organomegaly or masses appreciated. Normal bowel sounds heard. CNS: Alert and oriented. No focal deficits. Extremities: no edema, no cyanosis.  Mildly diminished right hand grip.    The results of  significant diagnostics from this hospitalization (including imaging, microbiology, ancillary and laboratory) are listed below for reference.     Microbiology: No results found for this or any previous visit (from the past 240 hours).   Labs: CBC: Recent Labs  Lab 10/13/23 1625 10/15/23 0531  WBC 5.2 3.9*  NEUTROABS 3.3  --   HGB 13.0 12.3*  HCT 39.4 36.2*  MCV 91.8 88.9  PLT 257 258    Basic Metabolic Panel: Recent Labs  Lab 10/13/23 1625 10/14/23 0451 10/15/23 0531  NA 134* 133* 133*  K 3.4* 4.2 4.3  CL 100 102  97*  CO2 21* 24 24  GLUCOSE 312* 143* 230*  BUN 12 11 16   CREATININE 0.76 0.77 0.94  CALCIUM  9.5 9.1 9.8  MG  --  1.7  --     Liver Function Tests: Recent Labs  Lab 10/13/23 1625  AST 23  ALT 17  ALKPHOS 81  BILITOT 0.5  PROT 8.1  ALBUMIN 3.8    CBG: Recent Labs  Lab 10/14/23 1640 10/14/23 2119 10/15/23 0608 10/15/23 1120 10/15/23 1609  GLUCAP 157* 161* 233* 172* 180*    Hgb A1c No results for input(s): HGBA1C in the last 72 hours.  Lipid Profile Recent Labs    10/14/23 0451  CHOL 290*  HDL 62  LDLCALC 208*  TRIG 102  CHOLHDL 4.7     Urinalysis    Component Value Date/Time   COLORURINE YELLOW 01/07/2023 1219   APPEARANCEUR CLEAR 01/07/2023 1219   LABSPEC 1.011 01/07/2023 1219   PHURINE 5.0 01/07/2023 1219   GLUCOSEU NEGATIVE 01/07/2023 1219   HGBUR SMALL (A) 01/07/2023 1219   BILIRUBINUR NEGATIVE 01/07/2023 1219   KETONESUR NEGATIVE 01/07/2023 1219   PROTEINUR NEGATIVE 01/07/2023 1219   UROBILINOGEN 0.2 05/23/2020 1028   NITRITE NEGATIVE 01/07/2023 1219   LEUKOCYTESUR NEGATIVE 01/07/2023 1219    Discussed with patient's girlfriend at bedside.    Time coordinating discharge: 40 minutes  SIGNED:  Trenda Mar, MD,  FACP, [facility], [facility], [facility]   [facility] [place]     To contact the attending provider between 7A-7P or the covering provider during after hours 7P-7A,  please log into the web site [email or web address] and access using universal [place] password for that web site. If you do not have the password, please call the hospital operator.

## 2023-10-15 NOTE — Plan of Care (Signed)
 Problem: Education: Goal: Knowledge of disease or condition will improve Outcome: Adequate for Discharge Goal: Knowledge of secondary prevention will improve (MUST DOCUMENT ALL) Outcome: Adequate for Discharge Goal: Knowledge of patient specific risk factors will improve Bruce Yang N/A or DELETE if not current risk factor) Outcome: Adequate for Discharge   Problem: Ischemic Stroke/TIA Tissue Perfusion: Goal: Complications of ischemic stroke/TIA will be minimized Outcome: Adequate for Discharge   Problem: Coping: Goal: Will verbalize positive feelings about self Outcome: Adequate for Discharge Goal: Will identify appropriate support needs Outcome: Adequate for Discharge   Problem: Health Behavior/Discharge Planning: Goal: Ability to manage health-related needs will improve Outcome: Adequate for Discharge Goal: Goals will be collaboratively established with patient/family Outcome: Adequate for Discharge   Problem: Self-Care: Goal: Ability to participate in self-care as condition permits will improve Outcome: Adequate for Discharge Goal: Verbalization of feelings and concerns over difficulty with self-care will improve Outcome: Adequate for Discharge Goal: Ability to communicate needs accurately will improve Outcome: Adequate for Discharge   Problem: Nutrition: Goal: Risk of aspiration will decrease Outcome: Adequate for Discharge Goal: Dietary intake will improve Outcome: Adequate for Discharge   Problem: Education: Goal: Ability to describe self-care measures that may prevent or decrease complications (Diabetes Survival Skills Education) will improve Outcome: Adequate for Discharge Goal: Individualized Educational Video(s) Outcome: Adequate for Discharge   Problem: Coping: Goal: Ability to adjust to condition or change in health will improve Outcome: Adequate for Discharge   Problem: Fluid Volume: Goal: Ability to maintain a balanced intake and output will  improve Outcome: Adequate for Discharge   Problem: Health Behavior/Discharge Planning: Goal: Ability to identify and utilize available resources and services will improve Outcome: Adequate for Discharge Goal: Ability to manage health-related needs will improve Outcome: Adequate for Discharge   Problem: Metabolic: Goal: Ability to maintain appropriate glucose levels will improve Outcome: Adequate for Discharge   Problem: Nutritional: Goal: Maintenance of adequate nutrition will improve Outcome: Adequate for Discharge Goal: Progress toward achieving an optimal weight will improve Outcome: Adequate for Discharge   Problem: Skin Integrity: Goal: Risk for impaired skin integrity will decrease Outcome: Adequate for Discharge   Problem: Tissue Perfusion: Goal: Adequacy of tissue perfusion will improve Outcome: Adequate for Discharge   Problem: Education: Goal: Knowledge of General Education information will improve Description: Including pain rating scale, medication(s)/side effects and non-pharmacologic comfort measures Outcome: Adequate for Discharge   Problem: Health Behavior/Discharge Planning: Goal: Ability to manage health-related needs will improve Outcome: Adequate for Discharge   Problem: Clinical Measurements: Goal: Ability to maintain clinical measurements within normal limits will improve Outcome: Adequate for Discharge Goal: Will remain free from infection Outcome: Adequate for Discharge Goal: Diagnostic test results will improve Outcome: Adequate for Discharge Goal: Respiratory complications will improve Outcome: Adequate for Discharge Goal: Cardiovascular complication will be avoided Outcome: Adequate for Discharge   Problem: Activity: Goal: Risk for activity intolerance will decrease Outcome: Adequate for Discharge   Problem: Nutrition: Goal: Adequate nutrition will be maintained Outcome: Adequate for Discharge   Problem: Coping: Goal: Level of  anxiety will decrease Outcome: Adequate for Discharge   Problem: Elimination: Goal: Will not experience complications related to bowel motility Outcome: Adequate for Discharge Goal: Will not experience complications related to urinary retention Outcome: Adequate for Discharge   Problem: Pain Management: Goal: General experience of comfort will improve Outcome: Adequate for Discharge   Problem: Safety: Goal: Ability to remain free from injury will improve Outcome: Adequate for Discharge   Problem: Skin Integrity: Goal: Risk for  impaired skin integrity will decrease Outcome: Adequate for Discharge

## 2023-10-15 NOTE — Progress Notes (Addendum)
 STROKE TEAM PROGRESS NOTE   SUBJECTIVE (INTERVAL HISTORY) No family is at the bedside. He is lying in bed, complaining of left leg popliteal region pain, LE venous doppler ordered.   OBJECTIVE Temp:  [97.9 F (36.6 C)-98.3 F (36.8 C)] 97.9 F (36.6 C) (01/08 1118) Pulse Rate:  [69-92] 79 (01/08 1118) Cardiac Rhythm: Normal sinus rhythm (01/08 0729) Resp:  [17-18] 18 (01/08 1118) BP: (138-182)/(72-107) 138/72 (01/08 1118) SpO2:  [92 %-99 %] 99 % (01/08 1118)  Recent Labs  Lab 10/14/23 1139 10/14/23 1640 10/14/23 2119 10/15/23 0608 10/15/23 1120  GLUCAP 174* 157* 161* 233* 172*   Recent Labs  Lab 10/13/23 1625 10/14/23 0451 10/15/23 0531  NA 134* 133* 133*  K 3.4* 4.2 4.3  CL 100 102 97*  CO2 21* 24 24  GLUCOSE 312* 143* 230*  BUN 12 11 16   CREATININE 0.76 0.77 0.94  CALCIUM  9.5 9.1 9.8  MG  --  1.7  --    Recent Labs  Lab 10/13/23 1625  AST 23  ALT 17  ALKPHOS 81  BILITOT 0.5  PROT 8.1  ALBUMIN 3.8   Recent Labs  Lab 10/13/23 1625 10/15/23 0531  WBC 5.2 3.9*  NEUTROABS 3.3  --   HGB 13.0 12.3*  HCT 39.4 36.2*  MCV 91.8 88.9  PLT 257 258   No results for input(s): CKTOTAL, CKMB, CKMBINDEX, TROPONINI in the last 168 hours. No results for input(s): LABPROT, INR in the last 72 hours. No results for input(s): COLORURINE, LABSPEC, PHURINE, GLUCOSEU, HGBUR, BILIRUBINUR, KETONESUR, PROTEINUR, UROBILINOGEN, NITRITE, LEUKOCYTESUR in the last 72 hours.  Invalid input(s): APPERANCEUR     Component Value Date/Time   CHOL 290 (H) 10/14/2023 0451   TRIG 102 10/14/2023 0451   HDL 62 10/14/2023 0451   CHOLHDL 4.7 10/14/2023 0451   VLDL 20 10/14/2023 0451   LDLCALC 208 (H) 10/14/2023 0451   Lab Results  Component Value Date   HGBA1C 6.3 08/19/2023      Component Value Date/Time   LABOPIA NONE DETECTED 10/13/2023 1625   COCAINSCRNUR NONE DETECTED 10/13/2023 1625   LABBENZ NONE DETECTED 10/13/2023 1625   AMPHETMU  POSITIVE (A) 10/13/2023 1625   THCU POSITIVE (A) 10/13/2023 1625   LABBARB NONE DETECTED 10/13/2023 1625    Recent Labs  Lab 10/13/23 1625  ETH 164*    I have personally reviewed the radiological images below and agree with the radiology interpretations.  CT ANGIO HEAD NECK W WO CM Result Date: 10/14/2023 CLINICAL DATA:  Stroke/TIA, determined embolic source. EXAM: CT ANGIOGRAPHY HEAD AND NECK WITH AND WITHOUT CONTRAST TECHNIQUE: Multidetector CT imaging of the head and neck was performed using the standard protocol during bolus administration of intravenous contrast. Multiplanar CT image reconstructions and MIPs were obtained to evaluate the vascular anatomy. Carotid stenosis measurements (when applicable) are obtained utilizing NASCET criteria, using the distal internal carotid diameter as the denominator. RADIATION DOSE REDUCTION: This exam was performed according to the departmental dose-optimization program which includes automated exposure control, adjustment of the mA and/or kV according to patient size and/or use of iterative reconstruction technique. CONTRAST:  75mL OMNIPAQUE  IOHEXOL  350 MG/ML SOLN COMPARISON:  MRI of the brain October 13, 2023. FINDINGS: CT HEAD FINDINGS Brain: Areas of acute/subacute infarcts are better demonstrated on recent MRI. Remote infarcts in the thalami and left basal. No hydrocephalus, hemorrhage, extra-axial collection or mass effect. Vascular: Calcified plaques in the bilateral carotid siphons. Skull: Normal. Negative for fracture or focal lesion. Sinuses/Orbits: Mucosal thickening of ethmoid  cells and left maxillary sinus. Other: None. Review of the MIP images confirms the above findings CTA NECK FINDINGS Aortic arch: 4 vessel aortic arch with the left vertebral artery originating directly from the aortic arch distal to the left subclavian artery. Imaged portion shows no evidence of aneurysm or dissection. Atherosclerotic changes with mixed density plaques are seen  and along the aortic arch and at the origin of major neck arteries. No significant stenosis. Right carotid system: Atherosclerotic changes are seen along the right common carotid artery and cervical right internal carotid artery. There is diffuse decrease in caliber of the cervical right ICA without focal high-grade stenosis. Left carotid system: Mild atherosclerotic changes of the left carotid bifurcation without hemodynamically significant stenosis. Vertebral arteries: Right dominant. Mild atherosclerotic changes at the origin of the vertebral arteries without hemodynamically significant stenosis. There is increased tortuosity of the V1 segment of the right vertebral artery and the P2 segment of the left vertebral artery. No evidence of dissection, stenosis (50% or greater), or occlusion. Skeleton: Degenerative changes of the cervical spine. Periapical lucency at the left mandibular canine and first and second molar teeth. Bilateral maxillary and mandibularis third molar teeth impaction. Other neck: Prominent adenoid tissue. Small thyroid  nodules measuring up to 7 mm. No follow-up imaging recommended. Upper chest: Negative. Review of the MIP images confirms the above findings CTA HEAD FINDINGS Anterior circulation: Severe stenosis at the petrous segment of the right ICA and moderate at the paraclinoid segment. Atherosclerotic plaques of left carotid siphon without hemodynamically significant stenosis. Hypoplastic/aplastic right A1/ACA segment with both A2 segments supplied by the left A1/ACA segment. Otherwise, the bilateral ACA and MCA vascular trees are patent without high-grade stenosis. Posterior circulation: Patent bilateral intracranial vertebral arteries. Mild luminal irregularity of the basilar are consistent with intracranial atherosclerotic disease without hemodynamically significant stenosis. Patent posterior cerebral arteries with focal moderate stenosis at the right P1-P2 junction and mild stenosis at  the proximal P2 segment of the left PCA. Venous sinuses: As permitted by contrast timing, patent. Anatomic variants: As above. Review of the MIP images confirms the above findings IMPRESSION: 1. Areas of acute/subacute infarcts are better demonstrated on recent MRI. 2. Remote infarcts in the thalami and left basal ganglia. 3. Severe stenosis at the petrous segment of the right ICA and moderate at the paraclinoid segment. 4. Diffuse decrease in caliber of the cervical right ICA related to upstream stenosis. 5. Focal moderate stenosis at the right P1-P2 junction and mild stenosis at the proximal P2 segment of the left PCA. 6. Aortic atherosclerosis. Aortic Atherosclerosis (ICD10-I70.0). Electronically Signed   By: Katyucia  de Macedo Rodrigues M.D.   On: 10/14/2023 16:17   ECHOCARDIOGRAM COMPLETE Result Date: 10/14/2023    ECHOCARDIOGRAM REPORT   Patient Name:   VERON SENNER Date of Exam: 10/14/2023 Medical Rec #:  [id number]        Height:       66.0 in Accession #:    [id number]       Weight:       159.0 lb Date of Birth:  January 13, 1971        BSA:          1.814 m Patient Age:    52 years         BP:           167/93 mmHg Patient Gender: M                HR:  81 bpm. Exam Location:  Inpatient Procedure: 2D Echo, Cardiac Doppler and Color Doppler Indications:    Stroke  History:        Patient has no prior history of Echocardiogram examinations.  Sonographer:    Jayson Gaskins Referring Phys: [id number] VASUNDHRA RATHORE IMPRESSIONS  1. Left ventricular ejection fraction, by estimation, is 40 to 45%. The left ventricle has mildly decreased function. The left ventricle demonstrates global hypokinesis. Indeterminate diastolic filling due to E-A fusion.  2. Right ventricular systolic function is normal. The right ventricular size is mildly enlarged. Tricuspid regurgitation signal is inadequate for assessing PA pressure.  3. Left atrial size was mildly dilated.  4. The mitral valve is grossly normal. Trivial mitral  valve regurgitation. No evidence of mitral stenosis.  5. The aortic valve is calcified. Aortic valve regurgitation is mild. Mild aortic valve stenosis. Aortic valve area, by VTI measures 1.13 cm. Aortic valve mean gradient measures 13.0 mmHg. Aortic valve Vmax measures 2.53 m/s.  6. The inferior vena cava is normal in size with greater than 50% respiratory variability, suggesting right atrial pressure of 3 mmHg. FINDINGS  Left Ventricle: Left ventricular ejection fraction, by estimation, is 40 to 45%. The left ventricle has mildly decreased function. The left ventricle demonstrates global hypokinesis. The left ventricular internal cavity size was normal in size. There is  no left ventricular hypertrophy. Indeterminate diastolic filling due to E-A fusion. Right Ventricle: The right ventricular size is mildly enlarged. No increase in right ventricular wall thickness. Right ventricular systolic function is normal. Tricuspid regurgitation signal is inadequate for assessing PA pressure. Left Atrium: Left atrial size was mildly dilated. Right Atrium: Right atrial size was normal in size. Pericardium: There is no evidence of pericardial effusion. Mitral Valve: The mitral valve is grossly normal. Trivial mitral valve regurgitation. No evidence of mitral valve stenosis. MV peak gradient, 9.7 mmHg. The mean mitral valve gradient is 4.0 mmHg. Tricuspid Valve: The tricuspid valve is grossly normal. Tricuspid valve regurgitation is trivial. No evidence of tricuspid stenosis. Aortic Valve: The aortic valve is calcified. Aortic valve regurgitation is mild. Mild aortic stenosis is present. Aortic valve mean gradient measures 13.0 mmHg. Aortic valve peak gradient measures 25.6 mmHg. Aortic valve area, by VTI measures 1.13 cm. Pulmonic Valve: The pulmonic valve was grossly normal. Pulmonic valve regurgitation is trivial. No evidence of pulmonic stenosis. Aorta: The aortic root is normal in size and structure. Venous: The inferior  vena cava is normal in size with greater than 50% respiratory variability, suggesting right atrial pressure of 3 mmHg. IAS/Shunts: The atrial septum is grossly normal.  LEFT VENTRICLE PLAX 2D LVIDd:         4.40 cm   Diastology LVIDs:         3.70 cm   LV e' medial:    6.74 cm/s LV PW:         1.20 cm   LV E/e' medial:  24.8 LV IVS:        1.00 cm   LV e' lateral:   8.59 cm/s LVOT diam:     2.00 cm   LV E/e' lateral: 19.4 LV SV:         55 LV SV Index:   30 LVOT Area:     3.14 cm  RIGHT VENTRICLE RV S prime:     14.60 cm/s TAPSE (M-mode): 2.0 cm LEFT ATRIUM             Index  RIGHT ATRIUM           Index LA Vol (A2C):   43.3 ml 23.87 ml/m  RA Area:     12.00 cm LA Vol (A4C):   72.3 ml 39.86 ml/m  RA Volume:   27.60 ml  15.21 ml/m LA Biplane Vol: 57.3 ml 31.59 ml/m  AORTIC VALVE AV Area (Vmax):    1.11 cm AV Area (Vmean):   1.32 cm AV Area (VTI):     1.13 cm AV Vmax:           253.00 cm/s AV Vmean:          169.000 cm/s AV VTI:            0.485 m AV Peak Grad:      25.6 mmHg AV Mean Grad:      13.0 mmHg LVOT Vmax:         89.20 cm/s LVOT Vmean:        71.200 cm/s LVOT VTI:          0.175 m LVOT/AV VTI ratio: 0.36  AORTA Ao Root diam: 2.60 cm MITRAL VALVE MV Area (PHT): 4.96 cm     SHUNTS MV Area VTI:   1.62 cm     Systemic VTI:  0.18 m MV Peak grad:  9.7 mmHg     Systemic Diam: 2.00 cm MV Mean grad:  4.0 mmHg MV Vmax:       1.56 m/s MV Vmean:      86.2 cm/s MV Decel Time: 153 msec MV E velocity: 167.00 cm/s MV A velocity: 52.20 cm/s MV E/A ratio:  3.20 Darryle Decent MD Electronically signed by Darryle Decent MD Signature Date/Time: 10/14/2023/3:21:36 PM    Final    MR Cervical Spine Wo Contrast Result Date: 10/13/2023 CLINICAL DATA:  Initial evaluation for acute myelopathy. Left shoulder pain. EXAM: MRI CERVICAL SPINE WITHOUT CONTRAST TECHNIQUE: Multiplanar, multisequence MR imaging of the cervical spine was performed. No intravenous contrast was administered. COMPARISON:  Prior radiograph from  earlier the same day. FINDINGS: Alignment: Examination degraded by motion artifact, limiting assessment. Specifically, the axial sequences are markedly degraded, limiting assessment of potential foraminal encroachment. Vertebral bodies normally aligned with preservation of the normal cervical lordosis. No significant listhesis. Vertebrae: Vertebral body height maintained without acute or chronic fracture. Bone marrow signal intensity overall within normal limits. No visible worrisome osseous lesions. No significant abnormal marrow edema. Cord: Grossly normal signal and morphology. Posterior Fossa, vertebral arteries, paraspinal tissues: Paraspinous soft tissues demonstrate no visible acute or significant abnormality. Disc levels: C2-C3: Unremarkable. C3-C4: Central disc protrusion indents the ventral thecal sac, contacting and mildly flattening the ventral cord. No visible cord signal changes. Mild spinal stenosis. Foramina appear grossly patent. C4-C5: Mild disc bulge with uncovertebral spurring. No significant spinal stenosis. Mild to moderate left C5 foraminal stenosis. Right neural foramen appears grossly patent. C5-C6: Degenerate intervertebral disc space narrowing with diffuse disc osteophyte complex. No significant spinal stenosis. Moderate to severe right with mild left C6 foraminal narrowing. C6-C7: Mild disc bulge with uncovertebral spurring. No significant spinal stenosis. Mild left with moderate right C7 foraminal stenosis. C7-T1:  Unremarkable. IMPRESSION: 1. Motion degraded exam. 2. No definite acute abnormality within the cervical spine or spinal cord. 3. Central disc protrusion at C3-4 with resultant mild spinal stenosis. 4. Multifactorial degenerative changes with resultant multilevel foraminal narrowing as above. Notable findings include mild to moderate left C5 foraminal stenosis, moderate to severe right with mild left C6 foraminal narrowing, with  moderate right C7 foraminal stenosis.  Electronically Signed   By: Morene Hoard M.D.   On: 10/13/2023 19:59   MR BRAIN WO CONTRAST Result Date: 10/13/2023 CLINICAL DATA:  Initial evaluation for acute neuro deficit, stroke suspected. EXAM: MRI HEAD WITHOUT CONTRAST TECHNIQUE: Multiplanar, multiecho pulse sequences of the brain and surrounding structures were obtained without intravenous contrast. COMPARISON:  Prior CT from earlier the same day. FINDINGS: Brain: Cerebral volume within normal limits. Mild chronic microvascular ischemic disease for age. Remote lacunar infarcts present about the bilateral thalami. Associated wallerian degeneration extending into the cerebral peduncles and brainstem noted. 1 cm focus of restricted diffusion involving the lateral right thalamus, consistent with an acute ischemic nonhemorrhagic infarct (series 10, image 24). Additional 2 cm focus of diffusion signal abnormality involving the left basal ganglia, also consistent with an ischemic infarct, slightly older in appearance and acute to early subacute in nature. No associated hemorrhage or mass effect. No other evidence for acute or subacute ischemia. Gray-white matter differentiation otherwise maintained. No other acute or chronic intracranial blood products. No mass lesion, midline shift or mass effect. No hydrocephalus or extra-axial fluid collection. Pituitary gland suprasellar region within normal limits. Vascular: Major intracranial vascular flow voids are maintained. Skull and upper cervical spine: Craniocervical junction within normal limits. Bone marrow signal intensity normal. No scalp soft tissue abnormality. Sinuses/Orbits: Globes and orbital soft tissues within normal limits. Scattered mucosal thickening noted about the ethmoidal air cells and maxillary sinuses. No significant mastoid effusion. Other: None. IMPRESSION: 1. 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 2. 2 cm acute to early subacute ischemic nonhemorrhagic left basal  ganglia infarct. 3. 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 Electronically Signed   By: Morene Hoard M.D.   On: 10/13/2023 19:54   CT Head Wo Contrast Result Date: 10/13/2023 CLINICAL DATA:  Numbness in the left arm beginning today. EXAM: CT HEAD WITHOUT CONTRAST TECHNIQUE: Contiguous axial images were obtained from the base of the skull through the vertex without intravenous contrast. RADIATION DOSE REDUCTION: This exam was performed according to the departmental dose-optimization program which includes automated exposure control, adjustment of the mA and/or kV according to patient size and/or use of iterative reconstruction technique. COMPARISON:  01/07/2023 FINDINGS: Brain: No focal abnormality affects the brainstem or cerebellum. New low-density seen within the anterior right thalamus consistent with an infarction. This is age indeterminate. On the left, there are old appearing infarctions in the region of the lateral thalamus/radiating white matter tracts. None of these findings were present in a parole. No cortical or large vessel territory infarction. No mass, hemorrhage, hydrocephalus or extra-axial collection. Vascular: There is atherosclerotic calcification of the major vessels at the base of the brain. Skull: Negative Sinuses/Orbits: Clear/normal Other: None IMPRESSION: 1. New since April low-density within the anterior right thalamus consistent with an infarction. This is exact age indeterminate. 2. Old appearing infarctions in the region of the lateral left thalamus/radiating white matter tracts. 3. No cortical or large vessel territory infarction. Electronically Signed   By: Oneil Officer M.D.   On: 10/13/2023 17:11   XR Cervical Spine With Flex & Extend Result Date: 10/13/2023 XRs of the cervical spine from 10/13/2023 were independently reviewed and interpreted, showing disc height loss with anterior osteophyte  formation at C5/6.  No other significant degenerative changes seen.  No evidence of instability on flexion/extension views.  No fracture or dislocation seen.   XR Lumb Spine Flex&Ext Only Result Date:  10/13/2023 XRs of the lumbar spine from 10/13/2023 was independently reviewed and interpreted, showing pars defects bilaterally at L5/S1.  Anterior listhesis at L5/S1 that shifts about 0.5 mm between flexion and extension views.  Disc height loss at L5/S1.  No other significant degenerative changes seen.  No fracture or dislocation seen.    PHYSICAL EXAM  Temp:  [97.9 F (36.6 C)-98.3 F (36.8 C)] 97.9 F (36.6 C) (01/08 1118) Pulse Rate:  [69-92] 79 (01/08 1118) Resp:  [17-18] 18 (01/08 1118) BP: (138-182)/(72-107) 138/72 (01/08 1118) SpO2:  [92 %-99 %] 99 % (01/08 1118)  General - Well nourished, well developed, in no apparent distress.  Ophthalmologic - fundi not visualized due to noncooperation.  Cardiovascular - Regular rhythm and rate.  Mental Status -  Level of arousal and orientation to time, place, and person were intact. Language including expression, naming, repetition, comprehension was assessed and found intact. Mild dysarthria Fund of Knowledge was assessed and was intact.  Cranial Nerves II - XII - II - Visual field intact OU. III, IV, VI - Extraocular movements intact. V - Facial sensation intact bilaterally, subjective left facial tingling. VII - Facial movement intact bilaterally. VIII - Hearing & vestibular intact bilaterally. X - Palate elevates symmetrically. XI - Chin turning & shoulder shrug intact bilaterally. XII - Tongue protrusion intact.  Motor Strength - The patient's strength was normal in LUE and LLE, but mild RUE pronator drift and decreased bicep tricep and finger grip, 4+/5. Mildly decreased R ankle DF and PF, 5-/5.  Bulk was normal and fasciculations were absent.   Motor Tone - Muscle tone was assessed at the neck and appendages and was  normal.  Reflexes - The patient's reflexes were symmetrical in all extremities and he had no pathological reflexes.  Sensory - Light touch, temperature/pinprick were assessed and were symmetrical, but subjective tingling on the left UE and LE.    Coordination - The patient had normal movements in the hands with no ataxia or dysmetria but slow on the right.  Tremor was absent.  Gait and Station - deferred.   ASSESSMENT/PLAN Mr. Bruce Yang is a 53 y.o. male with history of smoker, alcohol use and substance abuse admitted for left sided numbness. No TNK given due to outside window.    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 On my review, there was left midbrain and pontine wallerian degeneration present, so the left BG/CR infarct likely to be more chronic  CTA head and neck Severe stenosis at the petrous segment of the right ICA and moderate at the paraclinoid segment. Diffuse decrease in caliber of the cervical right ICA related to upstream stenosis. Focal moderate stenosis at the right P1-P2 junction and mild stenosis at the proximal P2 segment of the left PCA. On my review, the right ICA petrous segment moderate stenosis at the most.  2D Echo EF 40-45% LDL 208 HgbA1c 6.3 in 08/2022 UDS + amphetamine and THC Lovenox  for VTE prophylaxis No antithrombotic prior to admission, now on aspirin  81 mg daily and clopidogrel  75 mg daily DAPT for 3 months and then ASA alone given right ICA petrous/siphon segment stenosis. Patient counseled to be compliant with his antithrombotic medications Ongoing aggressive stroke risk factor management Therapy recommendations:  outpt PT/OT Disposition:  pending  Right intracranial ICA stenosis CTA  head and neck Severe stenosis  at the petrous segment of the right ICA and moderate at the paraclinoid segment. Diffuse decrease in caliber of the cervical right ICA related to upstream stenosis.  However, on my review, the right ICA petrous segment moderate stenosis at the most. The decreased caliber on the right ICA comparing with left more likely developmental.  No intervention needed On DAPT for 3 months and then ASA alone  Hypertension Stable Avoid low BP Long term BP goal normotensive  Hyperlipidemia Home meds:  none  LDL 208, goal < 70 Now on lipitor  80 Continue statin at discharge  HIV HIV antibody scrrening positive RPR neg ID on board CD4 = 292 Management per ID  Tobacco abuse Current smoker Smoking cessation counseling provided Pt is willing to quit  Alcohol abuse Alcohol limiting education provided On B1/FA/MVI  Substance abuse Hx of cocaine use in the past Current UDS + amphetamine and THC Cessation education provided Pt is willing to quit  Other Stroke Risk Factors   Other Active Problems Neck pain - following with orthopedics LBP - following with orthopedics Left popliteal pain - LE venous doppler no DVT  Hospital day # 0  Neurology will sign off. Please call with questions. Pt will follow up with stroke clinic NP at [facility] in about 4 weeks. Thanks for the consult.   Ary Cummins, MD PhD Stroke Neurology 10/15/2023 2:11 PM    To contact Stroke Continuity provider, please refer to [email or web address]. After hours, contact General Neurology

## 2023-10-15 NOTE — TOC Transition Note (Signed)
 Transition of Care [facility]) - Discharge Note   Patient Details  Name: Bruce Yang MRN: [id number] Date of Birth: December 18, 1970  Transition of Care [facility]) CM/SW Contact:  Andrez JULIANNA George, RN Phone Number: 10/15/2023, 4:28 PM   Clinical Narrative:     Pt is discharging home with outpatient therapy through [facility]. Pt will call to schedule the first appointment. DME was delivered to the room. CM sent request to [facility] to have pt assessed for home caregivers.  Pt has transportation home.   Final next level of care: OP Rehab Barriers to Discharge: No Barriers Identified   Patient Goals and CMS Choice     Choice offered to / list presented to : Patient      Discharge Placement                       Discharge Plan and Services Additional resources added to the After Visit Summary for     Discharge Planning Services: CM Consult            DME Arranged: Vannie rolling DME Agency: [employer] Date DME Agency Contacted: 10/14/23   Representative spoke with at DME Agency: Christyne            Social Drivers of Health (SDOH) Interventions SDOH Screenings   Food Insecurity: No Food Insecurity (10/14/2023)  Housing: Patient Declined (10/14/2023)  Transportation Needs: Patient Declined (10/14/2023)  Utilities: Patient Declined (10/14/2023)  Depression (PHQ2-9): Medium Risk (08/19/2023)  Tobacco Use: High Risk (09/25/2023)     Readmission Risk Interventions     No data to display

## 2023-10-15 NOTE — Progress Notes (Signed)
 Patient discharged, AVS instructions given, patient and his significant other verbalized understanding, dropped to main Entrance A

## 2023-10-15 NOTE — Plan of Care (Signed)
  Problem: Education: Goal: Knowledge of disease or condition will improve Outcome: Progressing Goal: Knowledge of secondary prevention will improve (MUST DOCUMENT ALL) Outcome: Progressing Goal: Knowledge of patient specific risk factors will improve Alonso N/A or DELETE if not current risk factor) Outcome: Progressing   Problem: Ischemic Stroke/TIA Tissue Perfusion: Goal: Complications of ischemic stroke/TIA will be minimized Outcome: Progressing   Problem: Coping: Goal: Will verbalize positive feelings about self Outcome: Progressing Goal: Will identify appropriate support needs Outcome: Progressing   Problem: Self-Care: Goal: Ability to participate in self-care as condition permits will improve Outcome: Progressing Goal: Verbalization of feelings and concerns over difficulty with self-care will improve Outcome: Progressing   Problem: Coping: Goal: Ability to adjust to condition or change in health will improve Outcome: Progressing

## 2023-10-15 NOTE — Consult Note (Addendum)
 Date of Admission:  10/13/2023          Reason for Consult: New HIV + test    Referring Provider: [facility] auto consult and Trenda Mar, MD   Assessment:  HIV + test --likely true positive CVA HTN Hyperlipidemia Polysubstance abuse   Plan:  HIV quant RNA ordered CD4 ordered as well as hepatitis B surface antigen which was negative Ordering HIV genotype as well as integrase genotype and G6PD level hepatitis AB and C serologies Will give him a dose of Dovato  today Provide him with a 30-day supply Girlfriend should get tested even if he is seronegative but if he is seropositive and comes to our clinic appointment we will build to do same-day partner testing for her.  She also may be interested in HIV preexposure prophylaxis   Bruce Yang has an appointment on  10/22/2023 at 9am with Dr. Fleeta Rothman at  [facility] for Infectious Disease, which  is located in the [facility] at  [address] [place] in [place].  [address], which is located to the left of the elevators.  Phone: [phone]  Fax: [phone]  [email or web address]  The patient should arrive 30 minutes prior to their appoitment.   Principal Problem:   Acute CVA (cerebrovascular accident) (HCC) Active Problems:   Hyperglycemia   Alcohol use   Hypokalemia   Hyponatremia   Substance abuse (HCC)   Cardiac murmur   HIV disease (HCC)   Therapeutic drug monitoring   Scheduled Meds:  aspirin  EC  81 mg Oral Daily   atorvastatin   80 mg Oral Daily   clopidogrel   75 mg Oral Daily   dolutegravir -lamiVUDine   1 tablet Oral Daily   enoxaparin  (LOVENOX ) injection  40 mg Subcutaneous Q24H   folic acid   1 mg Oral Daily   insulin  aspart  0-5 Units Subcutaneous QHS   insulin  aspart  0-9 Units Subcutaneous TID WC   multivitamin with minerals  1 tablet Oral Daily   thiamine   100 mg Oral Daily   Or   thiamine   100 mg Intravenous Daily   Continuous  Infusions: PRN Meds:.acetaminophen  **OR** acetaminophen  (TYLENOL ) oral liquid 160 mg/5 mL **OR** acetaminophen , LORazepam  **OR** LORazepam   HPI: Bruce Yang is a 53 y.o. male with history significant for polysubstance abuse chronic low back pain who came to the ER with numbness in his left face and arm and leg urine drug screen was positive for amphetamines and THC.  CT head showed a new low-density area in the anterior right thalamus consistent with an infarction brain MRI showed a 1 cm acute ischemic nonhemorrhagic right thalamic infarct and a 2 cm acute to early subacute ischemic nonhemorrhagic left basal ganglia infarct he had an MRI of the C-spine that was negative.  Neurology recommended admission for stroke workup Been placed on aspirin  Plavix  and Lipitor  as well as antihypertensive medications.  His screening HIV fourth-generation test was prelim positive for HIV and we were alerted to this via the vigilance system last night.  I have ordered HIV quantitative RNA hepatitis B surface antigen that was negative and this morning I have ordered HIV genotype testing.  I think the pretest probability of this being a true positive is very high and it is worthwhile initiating antiretroviral therapy.  I had an extensive discussion with the patient about the nature of HIV the nature of HIV testing and the medications we have available to treated.  Talked about the  fact that patients with HIV have essentially the same lifespan as those who are seronegative is provided they take their antiretroviral medications and should have the same quality of life.  Went over the current standard medications were used to treat HIV and potential side effects although they are fairly low incidence of them and the modern medications that we use that are so well-tolerated.  I talked about the fact that once his viral load came under 200 copies he would not be able to transmit this virus to anyone else sexually  again presuming this is a true positive.  I have also discussed his diagnosis with his girlfriend who he asked to be brought back to the room after we had a separate conversation without her in the room.  She says that she tested -5 years ago when she had a C-section.  The patient himself says he has only had this particular sexual partner over the last several years.  He denies any history of intravenous drug use.  Certainly this could still be a false positive whether the pretest probability is quite high and there is no harm in initiating antiretroviral therapy.  Therefore we will start Dovato  and give him his first days today and provided him with a 30-day supply as well as an appoint with me in the clinic.  I have personally spent 112 minutes involved in face-to-face and non-face-to-face activities for this patient on the day of the visit. Professional time spent includes the following activities: Preparing to see the patient (review of tests), Obtaining and/or reviewing separately obtained history (admission/discharge record), Performing a medically appropriate examination and/or evaluation , Ordering medications/tests/procedures, referring and communicating with other health care professionals including Dr. Judeth and Dr. Raenelle, Documenting clinical information in the EMR, Independently interpreting results (not separately reported), Communicating results to the patient/family/caregiver, Counseling and educating the patient/family/caregiver and Care coordination (not separately reported).     Review of Systems: Review of Systems  Constitutional:  Negative for chills, fever, malaise/fatigue and weight loss.  HENT:  Negative for congestion and sore throat.   Eyes:  Negative for blurred vision and photophobia.  Respiratory:  Positive for cough. Negative for shortness of breath and wheezing.   Cardiovascular:  Negative for chest pain, palpitations and leg swelling.  Gastrointestinal:   Negative for abdominal pain, blood in stool, constipation, diarrhea, heartburn, melena, nausea and vomiting.  Genitourinary:  Negative for dysuria, flank pain and hematuria.  Musculoskeletal:  Negative for back pain, falls, joint pain and myalgias.  Skin:  Negative for itching and rash.  Neurological:  Positive for focal weakness. Negative for dizziness, loss of consciousness, weakness and headaches.  Endo/Heme/Allergies:  Does not bruise/bleed easily.  Psychiatric/Behavioral:  Negative for depression and suicidal ideas. The patient does not have insomnia.     Past Medical History:  Diagnosis Date   Recurrent boils of anus     Social History   Tobacco Use   Smoking status: Every Day    Current packs/day: 1.00    Types: Cigarettes   Smokeless tobacco: Never  Vaping Use   Vaping status: Never Used  Substance Use Topics   Alcohol use: Yes    Alcohol/week: 2.0 standard drinks of alcohol    Types: 2 Shots of liquor per week   Drug use: Yes    Frequency: 5.0 times per week    Types: Marijuana    Family History  Problem Relation Age of Onset   Other Mother  Homicide   Hypertension Maternal Aunt    Diabetes Maternal Aunt    Cancer Maternal Uncle        Multiple myeloma   Allergies  Allergen Reactions   Antihistamines, Chlorpheniramine-Type     Affects Prostate     OBJECTIVE: Blood pressure (!) 155/83, pulse 70, temperature 98.1 F (36.7 C), temperature source Oral, resp. rate 18, SpO2 99%.  Physical Exam Constitutional:      Appearance: He is well-developed.  HENT:     Head: Normocephalic and atraumatic.  Eyes:     Conjunctiva/sclera: Conjunctivae normal.  Cardiovascular:     Rate and Rhythm: Normal rate and regular rhythm.  Pulmonary:     Effort: Pulmonary effort is normal. No respiratory distress.     Breath sounds: No wheezing.  Abdominal:     General: There is no distension.     Palpations: Abdomen is soft.  Musculoskeletal:        General: No  tenderness. Normal range of motion.     Cervical back: Normal range of motion and neck supple.  Skin:    General: Skin is warm and dry.     Coloration: Skin is not pale.     Findings: No erythema or rash.  Neurological:     Mental Status: He is alert and oriented to person, place, and time.  Psychiatric:        Mood and Affect: Mood normal.        Behavior: Behavior normal.        Thought Content: Thought content normal.        Judgment: Judgment normal.     Lab Results Lab Results  Component Value Date   WBC 3.9 (L) 10/15/2023   HGB 12.3 (L) 10/15/2023   HCT 36.2 (L) 10/15/2023   MCV 88.9 10/15/2023   PLT 258 10/15/2023    Lab Results  Component Value Date   CREATININE 0.94 10/15/2023   BUN 16 10/15/2023   NA 133 (L) 10/15/2023   K 4.3 10/15/2023   CL 97 (L) 10/15/2023   CO2 24 10/15/2023    Lab Results  Component Value Date   ALT 17 10/13/2023   AST 23 10/13/2023   ALKPHOS 81 10/13/2023   BILITOT 0.5 10/13/2023     Microbiology: No results found for this or any previous visit (from the past 240 hours).  Jomarie Fleeta Rothman, MD [facility] [phone] pager  10/15/2023, 10:52 AM

## 2023-10-16 LAB — GLUCOSE 6 PHOSPHATE DEHYDROGENASE
G6PDH: 1.3 U/g{Hb} — ABNORMAL LOW (ref 5.5–14.2)
Hemoglobin: 12.4 g/dL — ABNORMAL LOW (ref 13.0–17.7)

## 2023-10-17 ENCOUNTER — Encounter: Payer: Self-pay | Admitting: Family Medicine

## 2023-10-17 DIAGNOSIS — D75A Glucose-6-phosphate dehydrogenase (G6PD) deficiency without anemia: Secondary | ICD-10-CM | POA: Insufficient documentation

## 2023-10-20 NOTE — Therapy (Signed)
 OUTPATIENT PHYSICAL THERAPY NEURO EVALUATION   Patient Name: Bruce Yang MRN: [id number] DOB:April 11, 1971, 53 y.o., male Today's Date: 10/21/2023   PCP: Garnette Simpler REFERRING PROVIDER: Renato Applebaum  END OF SESSION:  PT End of Session - 10/21/23 1048     Visit Number 1    Date for PT Re-Evaluation 12/30/23    Authorization Type BCBS    PT Start Time 1100    PT Stop Time 1145    PT Time Calculation (min) 45 min    Activity Tolerance Patient limited by fatigue    Behavior During Therapy [facility] for tasks assessed/performed;Flat affect             Past Medical History:  Diagnosis Date   Recurrent boils of anus    History reviewed. No pertinent surgical history. Patient Active Problem List   Diagnosis Date Noted   G6PD deficiency 10/17/2023   HIV disease (HCC) 10/15/2023   Therapeutic drug monitoring 10/15/2023   Alcohol use 10/14/2023   Hypokalemia 10/14/2023   Hyponatremia 10/14/2023   Substance abuse (HCC) 10/14/2023   Cardiac murmur 10/14/2023   Methamphetamine use (HCC) 10/14/2023   Acute CVA (cerebrovascular accident) (HCC) 10/13/2023   Spondylolisthesis, lumbosacral region 09/25/2023   Prediabetes 08/20/2023   Hyperlipidemia 08/20/2023   Elevated blood pressure reading in office without diagnosis of hypertension 08/19/2023   Hyperglycemia 08/19/2023   Chronic low back pain without sciatica 08/19/2023   Peripheral neuropathy 08/19/2023   Tobacco use disorder 08/19/2023   Marijuana use, continuous 08/19/2023    ONSET DATE: 10/13/23  REFERRING DIAG:  I63.9 (ICD-10-CM) - Cerebrovascular accident (CVA), unspecified mechanism (HCC)    THERAPY DIAG:  Cerebrovascular accident (CVA) due to other mechanism (HCC)  Muscle weakness (generalized)  Other abnormalities of gait and mobility  Ataxic gait  Rationale for Evaluation and Treatment: Rehabilitation  SUBJECTIVE:                                                                                                                                                                                              SUBJECTIVE STATEMENT: Fine. My leg is hurting. I need to do more movement. I have not used the walker since I left the hospital.   Pt accompanied by: significant other  PERTINENT HISTORY: 53 y.o. male with medical history significant of polysubstance abuse, chronic low back pain presented to ED with complaint of numbness of his left face/arm/leg X 3 days.  In the ED, vital signs fairly stable.  Labs showed ethanol level 164, UDS positive for amphetamines and THC.  CT head showing new low density within the anterior right thalamus consistent with  an infarction, exact age indeterminate. Brain MRI showing a 1 cm acute ischemic nonhemorrhagic right thalamic infarct and a 2 cm acute to early subacute ischemic nonhemorrhagic left basal ganglia infarct. MRI of C-spine negative for acute abnormality. Neurology [facility] for stroke workup. Patient passed his swallow study in the ED. [facility] called to admit.   First stroke was March 31st, second one was 10/13/23   PAIN:  Are you having pain? Yes: NPRS scale: 5/10 Pain location: L leg and L shoulder Pain description: ache, dull, constant Aggravating factors: cold wind, laying for long period of time  Relieving factors: massage, movement, stretching   PRECAUTIONS: Fall  RED FLAGS: None   WEIGHT BEARING RESTRICTIONS: No  FALLS: Has patient fallen in last 6 months? Yes. Number of falls 1  LIVING ENVIRONMENT: Lives with: lives with an adult companion Lives in: House/apartment Stairs: Yes: External: 4 steps; bilateral but cannot reach both Has following equipment at home: Walker - 2 wheeled  PLOF: Independent and Independent with basic ADLs  PATIENT GOALS: walk and move my hands   OBJECTIVE:  Note: Objective measures were completed at Evaluation unless otherwise noted.  DIAGNOSTIC FINDINGS:  CT HEAD AND NECK 1.  Areas of acute/subacute infarcts are better demonstrated on recent MRI. 2. Remote infarcts in the thalami and left basal ganglia. 3. Severe stenosis at the petrous segment of the right ICA and moderate at the paraclinoid segment. 4. Diffuse decrease in caliber of the cervical right ICA related to upstream stenosis. 5. Focal moderate stenosis at the right P1-P2 junction and mild stenosis at the proximal P2 segment of the left PCA. 6. Aortic atherosclerosis.   MRI C-SPINE 1. Motion degraded exam. 2. No definite acute abnormality within the cervical spine or spinal cord. 3. Central disc protrusion at C3-4 with resultant mild spinal stenosis. 4. Multifactorial degenerative changes with resultant multilevel foraminal narrowing as above. Notable findings include mild to moderate left C5 foraminal stenosis, moderate to severe right with mild left C6 foraminal narrowing, with moderate right C7 foraminal stenosis.  BRAIN MRI 1. 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 2. 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 3. 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  COGNITION: Overall cognitive status: Impaired   SENSATION: WFL Some tingling on the left side   COORDINATION: Decreased coordination due to muscle weakness and decreased neuromuscular conrol  MUSCLE LENGTH: Hamstrings: tight in BLE   POSTURE: rounded shoulders and forward head  LOWER EXTREMITY ROM:   grossly WFL, some limitations with R ankle DF   LOWER EXTREMITY MMT:  4/5 BLE, 4-/5 for R knee extension and knee flexion    STAIRS: Level of Assistance: CGA Stair Negotiation Technique: Step to Pattern with Single Rail on Left Number of Stairs: 4  Height of Stairs: 6  Comments: reports he is able to get into home with 4 steps using railing on one side  GAIT: Gait pattern: step through pattern,  decreased arm swing- Right, decreased step length- Right, decreased stance time- Right, decreased stride length, decreased hip/knee flexion- Right, decreased ankle dorsiflexion- Right, ataxic, trendelenburg, narrow BOS, poor foot clearance- Right, and poor foot clearance- Left Distance walked: in clinic distances  Assistive device utilized: None Level of assistance: CGA Comments: unsteady gait, slowed cadence, decreased speed, decreased R hip and knee flexion and is not using RW   FUNCTIONAL TESTS:  5 times sit to stand:  15.82s CGA  Timed up and go (TUG): 18.09s  Berg Balance Scale: 41/56                                                                                                                               TREATMENT DATE: 10/21/23- EVAL    PATIENT EDUCATION: Education details: HEP, POC, post stroke aftercare Person educated: Patient and girlfriend Education method: Explanation, Demonstration, and Handouts Education comprehension: verbalized understanding  HOME EXERCISE PROGRAM: Access Code: [email or web address] URL: [email or web address] Date: 10/21/2023 Prepared by: Almetta Fam  Exercises - Sit to Stand  - 1 x daily - 7 x weekly - 2 sets - 10 reps - Heel Raises with Counter Support  - 1 x daily - 7 x weekly - 2 sets - 10 reps - Standing Hip Abduction with Counter Support  - 1 x daily - 7 x weekly - 2 sets - 10 reps - Standing March with Counter Support  - 1 x daily - 7 x weekly - 2 sets - 10 reps - Seated Hamstring Stretch  - 1 x daily - 7 x weekly - 2 sets - 2 reps - 15-30 hold - Seated Piriformis Stretch with Trunk Bend  - 1 x daily - 7 x weekly - 2 sets - 2 reps - 15-30 hold  GOALS: Goals reviewed with patient? Yes  SHORT TERM GOALS: Target date: 11/25/23  Patient will be independent with initial HEP. Baseline: given 10/21/23 Goal status: INITIAL  2.  Patient will demonstrate decreased fall risk by scoring < 14 sec on TUG. Baseline: 18.09s Goal status:  INITIAL   LONG TERM GOALS: Target date: 12/30/23  Patient will be independent with advanced/ongoing HEP to improve outcomes and carryover.  Baseline:  Goal status: INITIAL  2.  Patient will be able to ambulate 500' with LRAD with good safety to access community.  Baseline: ataxic gait not using RW, see gait analysis above Goal status: INITIAL  3.  Patient will be able to complete 10 consecutive taps on 6 box without loss of balance   Baseline: min-modA with 4 box  Goal status: INITIAL   4.  Patient will demonstrate SLS 10s bilaterally without UE support Baseline: 3s at best  Goal status: INITIAL  5.  Patient will score 49 on Berg Balance test to demonstrate lower risk of falls. (MCID= 8 points) .  Baseline: 41 Goal status: INITIAL   ASSESSMENT:  CLINICAL IMPRESSION: Patient is a 53 y.o. male who was seen today for physical therapy evaluation and treatment for CVA. He had a stroke last year that affected his R side and then another one on 10/13/23 which affected his L side. His R side deficits are more prominent especially with gait and his RUE. Patient was using a rolling walker in the hospital and was advised to use it upon discharge. However, he has not been compliant with it and is walking independently without an AD despite having ataxic gait  pattern. He presents as a fall risk as evident from functional tests completed at evaluation. He will benefit from skilled PT to address his gait and balance abnormalities to decrease his risk for falls and normalize his gait pattern. He reports fatigue after doing functional testing today and would benefit from endurance training as well.   OBJECTIVE IMPAIRMENTS: Abnormal gait, decreased activity tolerance, decreased balance, decreased coordination, decreased endurance, difficulty walking, decreased ROM, decreased strength, improper body mechanics, and pain.   ACTIVITY LIMITATIONS: carrying, lifting, bending, squatting, stairs, transfers,  and locomotion level  PARTICIPATION LIMITATIONS: cleaning, driving, shopping, community activity, and yard work  PERSONAL FACTORS: Age, Behavior pattern, Past/current experiences, Time since onset of injury/illness/exacerbation, and 3+ comorbidities: polysubstance abuse, chronic low back pain, peripheral neuropathy, hx of CVA, HIV  are also affecting patient's functional outcome.   REHAB POTENTIAL: Fair insurance visit limitation b/w all three disciplines    CLINICAL DECISION MAKING: Stable/uncomplicated  EVALUATION COMPLEXITY: Low  PLAN:  PT FREQUENCY: 2x/week  PT DURATION: 2 weeks  PLANNED INTERVENTIONS: 97110-Therapeutic exercises, 97530- Therapeutic activity, 97112- Neuromuscular re-education, 97535- Self Care, [id number]- Manual therapy, [id number]- Gait training, Patient/Family education, Balance training, Stair training, Dry Needling, Joint mobilization, Spinal mobilization, Cryotherapy, and Moist heat  PLAN FOR NEXT SESSION: work on strengthening for LE, balance training, address LE and low back pain    [employer], PT 10/21/2023, 12:06 PM

## 2023-10-21 ENCOUNTER — Ambulatory Visit (INDEPENDENT_AMBULATORY_CARE_PROVIDER_SITE_OTHER): Payer: BLUE CROSS/BLUE SHIELD | Admitting: Family Medicine

## 2023-10-21 ENCOUNTER — Encounter: Payer: Self-pay | Admitting: Speech Pathology

## 2023-10-21 ENCOUNTER — Other Ambulatory Visit (HOSPITAL_COMMUNITY): Payer: Self-pay

## 2023-10-21 ENCOUNTER — Encounter: Payer: Self-pay | Admitting: Family Medicine

## 2023-10-21 ENCOUNTER — Ambulatory Visit: Payer: BLUE CROSS/BLUE SHIELD | Admitting: Speech Pathology

## 2023-10-21 ENCOUNTER — Ambulatory Visit: Payer: BLUE CROSS/BLUE SHIELD | Attending: Internal Medicine

## 2023-10-21 ENCOUNTER — Telehealth: Payer: Self-pay

## 2023-10-21 VITALS — BP 144/70 | HR 92 | Temp 98.7°F | Ht 66.0 in | Wt 158.2 lb

## 2023-10-21 DIAGNOSIS — R41844 Frontal lobe and executive function deficit: Secondary | ICD-10-CM | POA: Diagnosis present

## 2023-10-21 DIAGNOSIS — I6389 Other cerebral infarction: Secondary | ICD-10-CM | POA: Insufficient documentation

## 2023-10-21 DIAGNOSIS — F17210 Nicotine dependence, cigarettes, uncomplicated: Secondary | ICD-10-CM

## 2023-10-21 DIAGNOSIS — R278 Other lack of coordination: Secondary | ICD-10-CM | POA: Insufficient documentation

## 2023-10-21 DIAGNOSIS — I700 Atherosclerosis of aorta: Secondary | ICD-10-CM | POA: Insufficient documentation

## 2023-10-21 DIAGNOSIS — M545 Low back pain, unspecified: Secondary | ICD-10-CM

## 2023-10-21 DIAGNOSIS — I639 Cerebral infarction, unspecified: Secondary | ICD-10-CM | POA: Insufficient documentation

## 2023-10-21 DIAGNOSIS — I779 Disorder of arteries and arterioles, unspecified: Secondary | ICD-10-CM | POA: Insufficient documentation

## 2023-10-21 DIAGNOSIS — R260 Ataxic gait: Secondary | ICD-10-CM | POA: Diagnosis present

## 2023-10-21 DIAGNOSIS — M6281 Muscle weakness (generalized): Secondary | ICD-10-CM | POA: Diagnosis present

## 2023-10-21 DIAGNOSIS — M79601 Pain in right arm: Secondary | ICD-10-CM | POA: Insufficient documentation

## 2023-10-21 DIAGNOSIS — F1510 Other stimulant abuse, uncomplicated: Secondary | ICD-10-CM | POA: Diagnosis not present

## 2023-10-21 DIAGNOSIS — R41840 Attention and concentration deficit: Secondary | ICD-10-CM | POA: Diagnosis present

## 2023-10-21 DIAGNOSIS — M79602 Pain in left arm: Secondary | ICD-10-CM | POA: Diagnosis present

## 2023-10-21 DIAGNOSIS — F17200 Nicotine dependence, unspecified, uncomplicated: Secondary | ICD-10-CM

## 2023-10-21 DIAGNOSIS — I6523 Occlusion and stenosis of bilateral carotid arteries: Secondary | ICD-10-CM

## 2023-10-21 DIAGNOSIS — B20 Human immunodeficiency virus [HIV] disease: Secondary | ICD-10-CM

## 2023-10-21 DIAGNOSIS — Z789 Other specified health status: Secondary | ICD-10-CM

## 2023-10-21 DIAGNOSIS — Z8673 Personal history of transient ischemic attack (TIA), and cerebral infarction without residual deficits: Secondary | ICD-10-CM

## 2023-10-21 DIAGNOSIS — F1290 Cannabis use, unspecified, uncomplicated: Secondary | ICD-10-CM | POA: Diagnosis not present

## 2023-10-21 DIAGNOSIS — R41841 Cognitive communication deficit: Secondary | ICD-10-CM | POA: Diagnosis present

## 2023-10-21 DIAGNOSIS — N529 Male erectile dysfunction, unspecified: Secondary | ICD-10-CM

## 2023-10-21 DIAGNOSIS — I10 Essential (primary) hypertension: Secondary | ICD-10-CM

## 2023-10-21 DIAGNOSIS — G8929 Other chronic pain: Secondary | ICD-10-CM

## 2023-10-21 DIAGNOSIS — R2689 Other abnormalities of gait and mobility: Secondary | ICD-10-CM | POA: Insufficient documentation

## 2023-10-21 LAB — HIV GENOSURE REFLEX - HIVGTY - ELECTRONIC RECORD

## 2023-10-21 LAB — GENOSURE INTEGRASE HIV EDI: HIV Genosure Integrase PDF 2: 1

## 2023-10-21 MED ORDER — AMLODIPINE BESY-BENAZEPRIL HCL 5-10 MG PO CAPS: 1.0000 | ORAL_CAPSULE | Freq: Every day | ORAL | 2 refills | Status: DC

## 2023-10-21 MED ORDER — BUPROPION HCL ER (SR) 150 MG PO TB12: ORAL_TABLET | ORAL | 2 refills | Status: DC

## 2023-10-21 MED ORDER — TADALAFIL 10 MG PO TABS: 10.0000 mg | ORAL_TABLET | Freq: Every day | ORAL | 3 refills | Status: DC | PRN

## 2023-10-21 NOTE — Assessment & Plan Note (Signed)
 Blood pressure is quite elevated today. It would be appropriate to start trying to get this back under control. I will start him on amlodipine-benazepril (Lotrel) 5-10 mg daily.

## 2023-10-21 NOTE — Assessment & Plan Note (Signed)
 Strongly advised smoking cessation, for both cigarettes and marijuana. Continue atorvastatin 40 mg daily.

## 2023-10-21 NOTE — Assessment & Plan Note (Signed)
Follow up with Dr. Moore

## 2023-10-21 NOTE — Progress Notes (Deleted)
 Subjective:  Chief complaint: follow-up for newly diagnosed HIV infection during recent hospital stay   Patient ID: Bruce Yang, male    DOB: 11-24-1970, 53 y.o.   MRN: [id number]  HPI   Past Medical History:  Diagnosis Date   Recurrent boils of anus    Stroke [facility])     No past surgical history on file.  Family History  Problem Relation Age of Onset   Other Mother        Homicide   Hypertension Maternal Aunt    Diabetes Maternal Aunt    Cancer Maternal Uncle        Multiple myeloma      Social History   Socioeconomic History   Marital status: Significant Other    Spouse name: Not on file   Number of children: 8   Years of education: Not on file   Highest education level: Associate degree: occupational, Scientist, product/process development, or vocational program  Occupational History   Occupation: Psychologist, occupational  Tobacco Use   Smoking status: Every Day    Current packs/day: 1.00    Types: Cigarettes   Smokeless tobacco: Never  Vaping Use   Vaping status: Never Used  Substance and Sexual Activity   Alcohol use: Not Currently    Alcohol/week: 2.0 standard drinks of alcohol    Types: 2 Shots of liquor per week   Drug use: Not Currently    Frequency: 5.0 times per week    Types: Marijuana   Sexual activity: Yes  Other Topics Concern   Not on file  Social History Narrative   Not on file   Social Drivers of Health   Financial Resource Strain: Not on file  Food Insecurity: No Food Insecurity (10/14/2023)   Hunger Vital Sign    Worried About Running Out of Food in the Last Year: Never true    Ran Out of Food in the Last Year: Never true  Transportation Needs: Patient Declined (10/14/2023)   PRAPARE - Administrator, Civil Service (Medical): Patient declined    Lack of Transportation (Non-Medical): Patient declined  Physical Activity: Not on file  Stress: Not on file  Social Connections: Not on file    Allergies  Allergen Reactions   Antihistamines, Chlorpheniramine-Type      Affects Prostate      Current Outpatient Medications:    acetaminophen  (TYLENOL ) 500 MG tablet, Take 1-2 tablets (500-1,000 mg total) by mouth every 6 (six) hours as needed for mild pain (pain score 1-3) or moderate pain (pain score 4-6)., Disp: , Rfl:    aspirin  EC 81 MG tablet, Take 1 tablet (81 mg total) by mouth daily. Swallow whole., Disp: 90 tablet, Rfl: 0   atorvastatin  (LIPITOR ) 80 MG tablet, Take 1 tablet (80 mg total) by mouth daily., Disp: 90 tablet, Rfl: 0   clopidogrel  (PLAVIX ) 75 MG tablet, Take 1 tablet (75 mg total) by mouth daily., Disp: 88 tablet, Rfl: 0   dolutegravir -lamiVUDine  (DOVATO ) 50-300 MG tablet, Take 1 tablet by mouth daily., Disp: 30 tablet, Rfl: 0   folic acid  (FOLVITE ) 1 MG tablet, Take 1 tablet (1 mg total) by mouth daily., Disp: 30 tablet, Rfl: 1   Multiple Vitamin (MULTIVITAMIN WITH MINERALS) TABS tablet, Take 1 tablet by mouth daily. (Patient not taking: Reported on 10/21/2023), Disp: , Rfl:    thiamine  (VITAMIN B1) 100 MG tablet, Take 1 tablet (100 mg total) by mouth daily. (Patient not taking: Reported on 10/21/2023), Disp: 30 tablet, Rfl: 1   Review  of Systems     Objective:   Physical Exam        Assessment & Plan:

## 2023-10-21 NOTE — Therapy (Addendum)
 .. OUTPATIENT SPEECH LANGUAGE PATHOLOGY EVALUATION   Patient Name: Bruce Yang MRN: [id number] DOB:[birth year], 53 y.o., male Today's Date: 10/21/2023  PCP: [name] MD REFERRING PROVIDER: Raenelle Coria, MD  END OF SESSION:  End of Session - 10/21/23 1019     Visit Number 1    Number of Visits 12    Date for SLP Re-Evaluation 12/02/23    SLP Start Time 1015    SLP Stop Time  1055    SLP Time Calculation (min) 40 min    Activity Tolerance Patient tolerated treatment well             Past Medical History:  Diagnosis Date   Recurrent boils of anus    History reviewed. No pertinent surgical history. Patient Active Problem List   Diagnosis Date Noted   G6PD deficiency 10/17/2023   HIV disease (HCC) 10/15/2023   Therapeutic drug monitoring 10/15/2023   Alcohol use 10/14/2023   Hypokalemia 10/14/2023   Hyponatremia 10/14/2023   Substance abuse (HCC) 10/14/2023   Cardiac murmur 10/14/2023   Methamphetamine use (HCC) 10/14/2023   Acute CVA (cerebrovascular accident) (HCC) 10/13/2023   Spondylolisthesis, lumbosacral region 09/25/2023   Prediabetes 08/20/2023   Hyperlipidemia 08/20/2023   Elevated blood pressure reading in office without diagnosis of hypertension 08/19/2023   Hyperglycemia 08/19/2023   Chronic low back pain without sciatica 08/19/2023   Peripheral neuropathy 08/19/2023   Tobacco use disorder 08/19/2023   Marijuana use, continuous 08/19/2023    ONSET DATE: 10/13/23   REFERRING DIAG: I63.9 (ICD-10-CM) - Cerebrovascular accident (CVA), unspecified mechanism (HCC)   THERAPY DIAG:  Cognitive communication deficit - Plan: SLP plan of care cert/re-cert  Rationale for Evaluation and Treatment: Rehabilitation  SUBJECTIVE:   SUBJECTIVE STATEMENT: Pt was pleasant and cooperative throughout assessment. Pt accompanied by: significant other; Daphane  PERTINENT HISTORY: Chronic back pain, hyperglycemia, marijuana use  PAIN:  Are you having  pain? Yes: NPRS scale: 5 Pain location: L arm, L leg Pain description: burning pain Aggravating factors: N/A Relieving factors: N/A  FALLS: Has patient fallen in last 6 months?  Yes; with CVA; no other falls  LIVING ENVIRONMENT: Lives with: lives with their partner Lives in: House/apartment  PLOF:  Level of assistance: Comment: R side weakness from suspected stroke in April 2024. Otherwise, Independent.  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  DIAGNOSTIC FINDINGS: Per chart review  MR BRAIN WO CONTRAST   10/13/2023  IMPRESSION: 1. 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 2. 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 3. 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     Electronically Signed   By: Morene Hoard M.D.   On: 10/13/2023 19:54  COGNITION: Overall cognitive status: Impaired Areas of impairment:  Attention: Impaired: Sustained, Selective, Alternating, Divided Memory: Impaired: Working Teacher, Music term Scientist, Research (physical Sciences) function: Impaired: Impulse control, Problem solving, Organization, Planning, Error awareness, Self-correction, and Slow processing Functional deficits: See below  COGNITIVE COMMUNICATION: Following directions: Follows multi-step commands inconsistently  Auditory comprehension: Impaired: requires repetition; suspect attention  Verbal expression: WFL Functional communication: Impaired: Girlfriend has not noted any significant changes other than attention; though he had difficulty in baseline. This impacts following directions and recall of conversations.  ORAL MOTOR EXAMINATION: Overall status: WFL Comments: Girlfriend reports some slurred speech; pt feels  like speech is normal.  STANDARDIZED ASSESSMENTS:  .SABRA  Cognitive Linguistic Quick Test: AGE - 18 - 69   The Cognitive Linguistic Quick Test (CLQT) was administered to assess the relative status of five cognitive domains: attention, memory, language, executive functioning, and visuospatial skills. Scores from 10 tasks were used to estimate severity ratings (standardized for age groups 18-69 years and 70-89 years) for each domain, a clock drawing task, as well as an overall composite severity rating of cognition.       Task Score Criterion Cut Scores  Personal Facts 8/8 8  Symbol Cancellation 11/12 11  Confrontation Naming 10/10 10  Clock Drawing  10/13 12  Story Retelling 5/10 6  Symbol Trails 5/10 9  Generative Naming 5/9 5  Design Memory 3/6 5  Mazes  6/8 7  Design Generation 6/13 6    Cognitive Domain Composite Score Severity Rating  Attention 160/215 Mild  Memory 121/185 Moderate  Executive Function 22/40 Mild  Language 28/37 Mild  Visuospatial Skills 68/105 Mild  Clock Drawing  10/13 Mild  Composite Severity Rating  Mild           PATIENT REPORTED OUTCOME MEASURES (PROM): Not completed                                                                                                                            TREATMENT DATE:     PATIENT EDUCATION: Education details: SLP role; results Person educated: Patient and partner Education method: Explanation Education comprehension: verbalized understanding and needs further education   GOALS: Goals reviewed with patient? Yes  SHORT TERM GOALS: Target date: 11/11/23   Pt will verbalize 3 strategies to increase active listening skills for recall of important information with minA verbal cues. Baseline:  Goal status: INITIAL   Pt will verbalize 3 strategies to support memory with minA verbal cues.  Baseline:  Goal status:  INITIAL    Patient will verbalize 3 strategies to assist with organization of tasks and  thoughts with minA verbal cues. Baseline:  Goal status: INITIAL   LONG TERM GOALS: Target date: 12/09/22   1.Pt will report success in using active listening skills to recall important information/details in home/ community. Baseline:  Goal status:  INTIAL    2.Pt will report successful use of strategies to support memory in daily activities. Baseline:  Goal status: INITIAL   3.Patient will report successful use of strategies to support task and thought organization in home/community. Baseline:  Goal status: INITIAL  ASSESSMENT:  CLINICAL IMPRESSION: Pt is a 53 yo male who presents to ST [facility] for evaluation post CVA. Pt endorses cognitive changes, specifically attention. Pt feels that he is 80% back to his baseline. Girlfriend reports pt has improved and is more like himself than he was in the hospital. Pt was assessed using CLQT - see above for scores. SLP observed deficits in attention and memory. Pt was also noted to be impulsive, not waiting for therapist to  finish directions before beginning task.Pt also had difficulty recalling directions of subtasks, but did not ask for help. SLP also noted decreased error awareness and problem solving to correct errors. SLP rec skilled ST services to address cognitive-communication impairment to maximize functional independence.      OBJECTIVE IMPAIRMENTS: include attention, memory, awareness, and executive functioning. These impairments are limiting patient from managing medications, managing appointments, managing finances, household responsibilities, ADLs/IADLs, and effectively communicating at home and in community. Factors affecting potential to achieve goals and functional outcome are  na .SABRA Patient will benefit from skilled SLP services to address above impairments and improve overall function.  REHAB POTENTIAL: Good  PLAN:  SLP FREQUENCY: 1-2x/week  SLP DURATION: 6 weeks  PLANNED INTERVENTIONS: Environmental controls, Cueing  hierachy, Internal/external aids, Functional tasks, SLP instruction and feedback, Compensatory strategies, Patient/family education, and [id number] Treatment of speech (30 or 45 min)     [employer], CCC-SLP 10/21/2023, 12:18 PM

## 2023-10-21 NOTE — Assessment & Plan Note (Signed)
 I explained to Bruce Yang and his SO that this test is not a false positive. He had confirmation of the initial positive test result with a PCR showing a high level of HIV in his blood stream and a low CD4 count. He should continue dolutegravir -lamivudine  (Dovato ) 50-300 mg daily. Ms. Delphia should be tested for HIV. He should keep his appointment with ID tomorrow.

## 2023-10-21 NOTE — Telephone Encounter (Addendum)
 [facility] Patient Advocate Encounter  Insurance verification completed.   The patient is insured through  [employer] & [employer] ([facility]) .     Ran test claim for BIKTARVY,DOVATO ,SYMTUZA  The current 30 day co-pay is $15.  PATIENT IS CURRENTLY ON DOVATO   If pt wants Cabenuva  he will have to use [facility]   We will continue to follow to see if copay assistance is needed.  This test claim was processed through [facility]- copay amounts may vary at other pharmacies due to pharmacy/plan contracts, or as the patient moves through the different stages of their insurance plan.

## 2023-10-21 NOTE — Assessment & Plan Note (Signed)
 Strongly advised alcohol abstinence.

## 2023-10-21 NOTE — Assessment & Plan Note (Signed)
 Strongly encouraged smoking cessation. In light of some depressive symptoms, we discussed starting bupropion, which should help both.  I spent 4 minutes counseling the patient about tobacco cessation.

## 2023-10-21 NOTE — Assessment & Plan Note (Signed)
 Although Bruce Yang denies use of this substance, I strongly advise against him using or handling this substance in the future.

## 2023-10-21 NOTE — Assessment & Plan Note (Signed)
 Some residual hemianesthesia since the event. Appears to have been a recurrent stroke. Recommend continue daily aspirin, lipid, and blood pressure management.

## 2023-10-21 NOTE — Assessment & Plan Note (Signed)
 Recommend he stop marijuana use due to increased risk for CAD and stroke.

## 2023-10-21 NOTE — Progress Notes (Signed)
 [facility] PRIMARY [facility]ARE [facility] [address] [place] [address] Dept: [phone] Dept Fax: [phone]  Hospital Follow-up Visit  Subjective:    Patient ID: Bruce Yang, male    DOB: Jul 25, 1971, 53 y.o..   MRN: [id number]  Chief Complaint  Patient presents with   Follow-up    Wants to discuss smoking sensation and wants something help with sex.    Elevated BP  198/100 this morning.    History of Present Illness:  Patient is in today for follow- up from a recent hospitalization. Bruce Yang was admitted at [facility] from 1/6-10/15/2023 having had an acute right CVA with numbness to his left face, arm, and leg.  He notes some persistent numbness at this point, esp. to his left arm. He also has some left lower leg pain, but feels this may be more due to his chronic low back pain, which he sees Dr. Georgina. He has been referred to PT, OT, and ST.  Bruce Yang does smoke cigarettes at about 1 ppd. He is interested in help to stop smoking. During the hospitalization, Bruce Yang tested for a high level of alcohol. Additionally, he tested positive for marijuana and methamphetamines. He denies use of methamphetamines personally, but notes that he does handle this drug at times and feels he may have picked some of this up by licking his fingers.  Bruce Yang also tested positive for HIV. He denies any IVDU or sexual encounters with anyone known to have HIV. He does admit to tattoos that were either homemade or performed outside of a permitted tattoo parlor. His significant other was asking if this could be a false positive test.  Past Medical History: Patient Active Problem List   Diagnosis Date Noted   Essential hypertension 10/21/2023   Erectile dysfunction 10/21/2023   G6PD deficiency 10/17/2023   HIV disease (HCC) 10/15/2023   Therapeutic drug monitoring 10/15/2023   Alcohol use 10/14/2023   Substance abuse (HCC) 10/14/2023   Cardiac murmur  10/14/2023   Methamphetamine use (HCC) 10/14/2023   Acute CVA (cerebrovascular accident) (HCC) 10/13/2023   Spondylolisthesis, lumbosacral region 09/25/2023   Prediabetes 08/20/2023   Hyperlipidemia 08/20/2023   Hyperglycemia 08/19/2023   Chronic low back pain without sciatica 08/19/2023   Peripheral neuropathy 08/19/2023   Tobacco use disorder 08/19/2023   Marijuana use, continuous 08/19/2023   History reviewed. No pertinent surgical history. Family History  Problem Relation Age of Onset   Other Mother        Homicide   Hypertension Maternal Aunt    Diabetes Maternal Aunt    Cancer Maternal Uncle        Multiple myeloma   Outpatient Medications Prior to Visit  Medication Sig Dispense Refill   acetaminophen  (TYLENOL ) 500 MG tablet Take 1-2 tablets (500-1,000 mg total) by mouth every 6 (six) hours as needed for mild pain (pain score 1-3) or moderate pain (pain score 4-6).     aspirin  EC 81 MG tablet Take 1 tablet (81 mg total) by mouth daily. Swallow whole. 90 tablet 0   atorvastatin  (LIPITOR ) 80 MG tablet Take 1 tablet (80 mg total) by mouth daily. 90 tablet 0   clopidogrel  (PLAVIX ) 75 MG tablet Take 1 tablet (75 mg total) by mouth daily. 88 tablet 0   dolutegravir -lamiVUDine  (DOVATO ) 50-300 MG tablet Take 1 tablet by mouth daily. 30 tablet 0   folic acid  (FOLVITE ) 1 MG tablet Take 1 tablet (1 mg total) by mouth daily. 30 tablet 1  Multiple Vitamin (MULTIVITAMIN WITH MINERALS) TABS tablet Take 1 tablet by mouth daily. (Patient not taking: Reported on 10/21/2023)     thiamine  (VITAMIN B1) 100 MG tablet Take 1 tablet (100 mg total) by mouth daily. (Patient not taking: Reported on 10/21/2023) 30 tablet 1   No facility-administered medications prior to visit.   Allergies  Allergen Reactions   Antihistamines, Chlorpheniramine-Type     Affects Prostate      Objective:   Today's Vitals   10/21/23 1257  BP: (!) 164/90  Pulse: 92  Temp: 98.7 F (37.1 C)  TempSrc: Temporal   SpO2: 99%  Weight: 158 lb 3.2 oz (71.8 kg)  Height: 5' 6 (1.676 m)   Body mass index is 25.53 kg/m.   General: Well developed, well nourished. No acute distress. Neuro: Grip strength is equal bilaterally. Psych: Alert and oriented. Normal mood and affect.  Health Maintenance Due  Topic Date Due   Pneumococcal Vaccine 36-49 Years old (1 of 2 - PCV) Never done   Zoster Vaccines- Shingrix  (1 of 2) Never done   Colonoscopy  Never done   Lab Results Component Ref Range & Units (hover) 7 d ago  Total lymphocyte count 1,068  CD4% 27.31 Low   CD4 absolute 292 Low   CD8tox 42.22 High   CD8 T Cell Abs 451  Ratio 0.65 Low    Component Ref Range & Units (hover) 7 d ago  HIV 1 RNA Quant 109,000 VC  Comment: (NOTE) The reportable range for this assay is 20 to 10,000,000 copies HIV-1 RNA/mL.  LOG10 HIV-1 RNA 5.037  Comment: (NOTE) Performed At: [facility] [address] [place], [place] [id number] Jennette Shorter MD Ey:[id number]   Component Ref Range & Units (hover) 7 d ago  HIV 1 Ab Reactive  HIV 2 Ab Non Reactive   Imaging: CT Head wo contrast (10/13/2023) IMPRESSION: 1. New since April low-density within the anterior right thalamus consistent with an infarction. This is exact age indeterminate. 2. Old appearing infarctions in the region of the lateral left thalamus/radiating white matter tracts. 3. No cortical or large vessel territory infarction.  MR Brain wo contrast (10/13/2023) IMPRESSION: 1. 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 2. 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 3. 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  MR Cervical Spine wo contrast (10/13/2023) IMPRESSION: 1. Motion degraded exam. 2. No definite acute abnormality within the cervical spine or spinal cord. 3. Central disc protrusion at C3-4 with resultant mild  spinal stenosis. 4. Multifactorial degenerative changes with resultant multilevelforaminal narrowing as above. Notable findings include mild to moderate left C5 foraminal stenosis, moderate to severe right with mild left C6 foraminal narrowing, with moderate right C7 foraminal stenosis.  CT Angio of Heand and Neck w wo contrast (10/14/2023) IMPRESSION: 1. Areas of acute/subacute infarcts are better demonstrated on recent MRI. 2. Remote infarcts in the thalami and left basal ganglia. 3. Severe stenosis at the petrous segment of the right ICA and moderate at the paraclinoid segment. 4. Diffuse decrease in caliber of the cervical right ICA related to upstream stenosis. 5. Focal moderate stenosis at the right P1-P2 junction and mild stenosis at the proximal P2 segment of the left PCA. 6. Aortic atherosclerosis.  Echocardiogram (10/14/2023) IMPRESSIONS   1. Left ventricular ejection fraction, by estimation, is 40 to 45%. The left ventricle has mildly decreased function. The left ventricle demonstrates global hypokinesis. Indeterminate diastolic  filling due to  E-A fusion.   2. Right ventricular systolic function is normal. The right ventricular size is mildly enlarged. Tricuspid regurgitation signal is inadequate for assessing PA pressure.   3. Left atrial size was mildly dilated.   4. The mitral valve is grossly normal. Trivial mitral valve regurgitation. No evidence of mitral stenosis.   5. The aortic valve is calcified. Aortic valve regurgitation is mild. Mild aortic valve stenosis. Aortic valve area, by VTI measures 1.13 cm. Aortic valve mean gradient measures 13.0 mmHg. Aortic valve Vmax measures  2.53 m/s.   6. The inferior vena cava is normal in size with greater than 50% respiratory variability, suggesting right atrial pressure of 3 mmHg.     Assessment & Plan:   Problem List Items Addressed This Visit       Cardiovascular and Mediastinum   Acute CVA (cerebrovascular accident) (HCC) -  Primary   Some residual hemianesthesia since the event. Appears to have been a recurrent stroke. Recommend continue daily aspirin , lipid, and blood pressure management.      Relevant Medications   tadalafil  (CIALIS ) 10 MG tablet   amLODipine -benazepril  (LOTREL ) 5-10 MG capsule   Carotid artery disease (HCC)   Strongly advised smoking cessation, for both cigarettes and marijuana. Continue atorvastatin  40 mg daily.      Relevant Medications   tadalafil  (CIALIS ) 10 MG tablet   amLODipine -benazepril  (LOTREL ) 5-10 MG capsule   Essential hypertension   Blood pressure is quite elevated today. It would be appropriate to start trying to get this back under control. I will start him on amlodipine -benazepril  (Lotrel ) 5-10 mg daily.      Relevant Medications   tadalafil  (CIALIS ) 10 MG tablet   amLODipine -benazepril  (LOTREL ) 5-10 MG capsule     Other   Alcohol use   Strongly advised alcohol abstinence.      Chronic low back pain without sciatica   Follow-up with Dr. Georgina.      Relevant Medications   buPROPion  (WELLBUTRIN  SR) 150 MG 12 hr tablet   Erectile dysfunction   Bruce Yang has concern for ED. I will prescribe Cialis  5 mg daily PRN.      Relevant Medications   tadalafil  (CIALIS ) 10 MG tablet   HIV disease (HCC)   I explained to Bruce Yang and his SO that this test is not a false positive. He had confirmation of the initial positive test result with a PCR showing a high level of HIV in his blood stream and a low CD4 count. He should continue dolutegravir -lamivudine  (Dovato ) 50-300 mg daily. Ms. Delphia should be tested for HIV. He should keep his appointment with ID tomorrow.      Marijuana use, continuous   Recommend he stop marijuana use due to increased risk for CAD and stroke.      Methamphetamine use [facility])   Although Mr. Lusby denies use of this substance, I strongly advise against him using or handling this substance in the future.      Tobacco use disorder    Strongly encouraged smoking cessation. In light of some depressive symptoms, we discussed starting bupropion , which should help both.  I spent 4 minutes counseling the patient about tobacco cessation.       Relevant Medications   buPROPion  (WELLBUTRIN  SR) 150 MG 12 hr tablet    Return in about 4 weeks (around 11/18/2023) for Reassessment.   Garnette CHRISTELLA Simpler, MD

## 2023-10-21 NOTE — Assessment & Plan Note (Signed)
 Bruce Yang has concern for ED. I will prescribe Cialis 5 mg daily PRN.

## 2023-10-22 ENCOUNTER — Other Ambulatory Visit: Payer: Self-pay | Admitting: Physician Assistant

## 2023-10-22 ENCOUNTER — Ambulatory Visit: Payer: Self-pay | Admitting: Infectious Disease

## 2023-10-22 DIAGNOSIS — R011 Cardiac murmur, unspecified: Secondary | ICD-10-CM

## 2023-10-22 DIAGNOSIS — D75A Glucose-6-phosphate dehydrogenase (G6PD) deficiency without anemia: Secondary | ICD-10-CM

## 2023-10-22 DIAGNOSIS — F17200 Nicotine dependence, unspecified, uncomplicated: Secondary | ICD-10-CM

## 2023-10-22 DIAGNOSIS — Z789 Other specified health status: Secondary | ICD-10-CM

## 2023-10-22 DIAGNOSIS — B20 Human immunodeficiency virus [HIV] disease: Secondary | ICD-10-CM

## 2023-10-22 DIAGNOSIS — I639 Cerebral infarction, unspecified: Secondary | ICD-10-CM

## 2023-10-22 DIAGNOSIS — F1510 Other stimulant abuse, uncomplicated: Secondary | ICD-10-CM

## 2023-10-22 DIAGNOSIS — E785 Hyperlipidemia, unspecified: Secondary | ICD-10-CM

## 2023-10-23 ENCOUNTER — Ambulatory Visit: Payer: BLUE CROSS/BLUE SHIELD | Admitting: Occupational Therapy

## 2023-10-23 NOTE — Therapy (Deleted)
OUTPATIENT OCCUPATIONAL THERAPY NEURO EVALUATION  Patient Name: Bruce Yang MRN: [id number] DOB:16-Apr-1971, 53 y.o., male Today's Date: 10/23/2023  PCP: *** REFERRING PROVIDER: ***  END OF SESSION:   Past Medical History:  Diagnosis Date   Recurrent boils of anus    Stroke (HCC)    No past surgical history on file. Patient Active Problem List   Diagnosis Date Noted   Essential hypertension 10/21/2023   Erectile dysfunction 10/21/2023   Aortic atherosclerosis (HCC) 10/21/2023   Carotid artery disease (HCC) 10/21/2023   G6PD deficiency 10/17/2023   HIV disease (HCC) 10/15/2023   Therapeutic drug monitoring 10/15/2023   Alcohol use 10/14/2023   Substance abuse (HCC) 10/14/2023   Cardiac murmur 10/14/2023   Methamphetamine use (HCC) 10/14/2023   Acute CVA (cerebrovascular accident) (HCC) 10/13/2023   Spondylolisthesis, lumbosacral region 09/25/2023   Prediabetes 08/20/2023   Hyperlipidemia 08/20/2023   Hyperglycemia 08/19/2023   Chronic low back pain without sciatica 08/19/2023   Peripheral neuropathy 08/19/2023   Tobacco use disorder 08/19/2023   Marijuana use, continuous 08/19/2023    ONSET DATE: ***  REFERRING DIAG: ***  THERAPY DIAG:  No diagnosis found.  Rationale for Evaluation and Treatment: Rehabilitation  SUBJECTIVE:   SUBJECTIVE STATEMENT: *** Pt accompanied by: {accompnied:27141}  PERTINENT HISTORY: 53 y.o. male with medical history significant of polysubstance abuse, chronic low back pain presented to ED with complaint of numbness of his left face/arm/leg X 3 days.  In the ED, vital signs fairly stable.  Labs showed ethanol level 164, UDS positive for amphetamines and THC.  CT head showing new low density within the anterior right thalamus consistent with an infarction, exact age indeterminate. Brain MRI showing a 1 cm acute ischemic nonhemorrhagic right thalamic infarct and a 2 cm acute to early subacute ischemic nonhemorrhagic left basal ganglia  infarct. MRI of C-spine negative for acute abnormality. Neurology recommended admission to [facility] for stroke workup. Patient passed his swallow study in the ED. [facility] called to admit.    First stroke was March 31st, second one was 10/13/23   PRECAUTIONS: {Therapy precautions:24002}  WEIGHT BEARING RESTRICTIONS: {Yes ***/No:24003}  PAIN:  Are you having pain? {OPRCPAIN:27236}  FALLS: Has patient fallen in last 6 months? {fallsyesno:27318}  LIVING ENVIRONMENT: Lives with: {OPRC lives with:25569::"lives with their family"} Lives in: {Lives in:25570} Stairs: {opstairs:27293} Has following equipment at home: {Assistive devices:23999}  PLOF: {PLOF:24004}  PATIENT GOALS: ***  OBJECTIVE:  Note: Objective measures were completed at Evaluation unless otherwise noted.  HAND DOMINANCE: {MISC; OT HAND DOMINANCE:[phone]}  ADLs: Overall ADLs: *** Transfers/ambulation related to ADLs: Eating: *** Grooming: *** UB Dressing: *** LB Dressing: *** Toileting: *** Bathing: *** Tub Shower transfers: *** Equipment: {equipment:25573}  IADLs: Shopping: *** Light housekeeping: *** Meal Prep: *** Community mobility: *** Medication management: *** Financial management: *** Handwriting: {OTWRITTENEXPRESSION:25361}  MOBILITY STATUS: {OTMOBILITY:25360}  POSTURE COMMENTS:  {posture:25561} Sitting balance: {sitting balance:25483}  ACTIVITY TOLERANCE: Activity tolerance: ***  FUNCTIONAL OUTCOME MEASURES: {OTFUNCTIONALMEASURES:27238}  UPPER EXTREMITY ROM:    {AROM/PROM:27142} ROM Right eval Left eval  Shoulder flexion    Shoulder abduction    Shoulder adduction    Shoulder extension    Shoulder internal rotation    Shoulder external rotation    Elbow flexion    Elbow extension    Wrist flexion    Wrist extension    Wrist ulnar deviation    Wrist radial deviation    Wrist pronation    Wrist supination    (Blank rows = not tested)  UPPER EXTREMITY MMT:      MMT Right eval Left eval  Shoulder flexion    Shoulder abduction    Shoulder adduction    Shoulder extension    Shoulder internal rotation    Shoulder external rotation    Middle trapezius    Lower trapezius    Elbow flexion    Elbow extension    Wrist flexion    Wrist extension    Wrist ulnar deviation    Wrist radial deviation    Wrist pronation    Wrist supination    (Blank rows = not tested)  HAND FUNCTION: {handfunction:27230}  COORDINATION: {otcoordination:27237}  SENSATION: {sensation:27233}  EDEMA: ***  MUSCLE TONE: {UETONE:25567}  COGNITION: Overall cognitive status: {cognition:24006}  VISION: Subjective report: *** Baseline vision: {OTBASELINEVISION:25363} Visual history: {OTVISUALHISTORY:25364}  VISION ASSESSMENT: {visionassessment:27231}  Patient has difficulty with following activities due to following visual impairments: ***  PERCEPTION: {Perception:25564}  PRAXIS: {Praxis:25565}  OBSERVATIONS: ***                                                                                                                             TREATMENT DATE: ***         PATIENT EDUCATION: Education details: *** Person educated: {Person educated:25204} Education method: {Education Method:25205} Education comprehension: {Education Comprehension:25206}  HOME EXERCISE PROGRAM: ***   GOALS: Goals reviewed with patient? {yes/no:20286}  SHORT TERM GOALS: Target date: ***  *** Baseline: Goal status: INITIAL  2.  *** Baseline:  Goal status: INITIAL  3.  *** Baseline:  Goal status: INITIAL  4.  *** Baseline:  Goal status: INITIAL  5.  *** Baseline:  Goal status: INITIAL  6.  *** Baseline:  Goal status: INITIAL  LONG TERM GOALS: Target date: ***  *** Baseline:  Goal status: INITIAL  2.  *** Baseline:  Goal status: INITIAL  3.  *** Baseline:  Goal status: INITIAL  4.  *** Baseline:  Goal status: INITIAL  5.   *** Baseline:  Goal status: INITIAL  6.  *** Baseline:  Goal status: INITIAL  ASSESSMENT:  CLINICAL IMPRESSION: Patient is a *** y.o. *** who was seen today for occupational therapy evaluation for ***.   PERFORMANCE DEFICITS: in functional skills including {OT physical skills:25468}, cognitive skills including {OT cognitive skills:25469}, and psychosocial skills including {OT psychosocial skills:25470}.   IMPAIRMENTS: are limiting patient from {OT performance deficits:25471}.   CO-MORBIDITIES: {Comorbidities:25485} that affects occupational performance. Patient will benefit from skilled OT to address above impairments and improve overall function.  MODIFICATION OR ASSISTANCE TO COMPLETE EVALUATION: {OT modification:25474}  OT OCCUPATIONAL PROFILE AND HISTORY: {OT PROFILE AND HISTORY:25484}  CLINICAL DECISION MAKING: {OT CDM:25475}  REHAB POTENTIAL: {rehabpotential:25112}  EVALUATION COMPLEXITY: {Evaluation complexity:25115}    PLAN:  OT FREQUENCY: {rehab frequency:25116}  OT DURATION: {rehab duration:25117}  PLANNED INTERVENTIONS: {OT Interventions:25467}  RECOMMENDED OTHER SERVICES: ***  CONSULTED AND AGREED WITH PLAN OF CARE: {NUU:72536}  PLAN FOR NEXT SESSION: Keene Breath, OT  10/23/2023, 1:18 PM

## 2023-10-27 ENCOUNTER — Other Ambulatory Visit: Payer: Self-pay

## 2023-10-27 ENCOUNTER — Other Ambulatory Visit (HOSPITAL_COMMUNITY): Payer: Self-pay

## 2023-10-27 ENCOUNTER — Ambulatory Visit (INDEPENDENT_AMBULATORY_CARE_PROVIDER_SITE_OTHER): Payer: BLUE CROSS/BLUE SHIELD | Admitting: Infectious Disease

## 2023-10-27 ENCOUNTER — Other Ambulatory Visit: Payer: Self-pay | Admitting: Pharmacist

## 2023-10-27 ENCOUNTER — Encounter: Payer: Self-pay | Admitting: Infectious Disease

## 2023-10-27 VITALS — BP 149/85 | HR 80 | Resp 16 | Ht 66.0 in | Wt 158.0 lb

## 2023-10-27 DIAGNOSIS — Z23 Encounter for immunization: Secondary | ICD-10-CM

## 2023-10-27 DIAGNOSIS — B20 Human immunodeficiency virus [HIV] disease: Secondary | ICD-10-CM

## 2023-10-27 DIAGNOSIS — D75A Glucose-6-phosphate dehydrogenase (G6PD) deficiency without anemia: Secondary | ICD-10-CM

## 2023-10-27 DIAGNOSIS — F1510 Other stimulant abuse, uncomplicated: Secondary | ICD-10-CM

## 2023-10-27 DIAGNOSIS — F17200 Nicotine dependence, unspecified, uncomplicated: Secondary | ICD-10-CM

## 2023-10-27 DIAGNOSIS — I639 Cerebral infarction, unspecified: Secondary | ICD-10-CM

## 2023-10-27 DIAGNOSIS — R011 Cardiac murmur, unspecified: Secondary | ICD-10-CM

## 2023-10-27 DIAGNOSIS — I700 Atherosclerosis of aorta: Secondary | ICD-10-CM

## 2023-10-27 DIAGNOSIS — I6523 Occlusion and stenosis of bilateral carotid arteries: Secondary | ICD-10-CM

## 2023-10-27 DIAGNOSIS — Z789 Other specified health status: Secondary | ICD-10-CM

## 2023-10-27 DIAGNOSIS — E785 Hyperlipidemia, unspecified: Secondary | ICD-10-CM

## 2023-10-27 MED ORDER — DOVATO 50-300 MG PO TABS
1.0000 | ORAL_TABLET | Freq: Every day | ORAL | 11 refills | Status: DC
  Filled 2023-10-27: qty 30, 30d supply, fill #0
  Filled 2023-12-01: qty 30, 30d supply, fill #1
  Filled 2024-01-09: qty 30, 30d supply, fill #2
  Filled 2024-02-09 (×2): qty 30, 30d supply, fill #3
  Filled 2024-03-02: qty 30, 30d supply, fill #4
  Filled 2024-04-05: qty 30, 30d supply, fill #5
  Filled 2024-05-04: qty 30, 30d supply, fill #6
  Filled 2024-06-04: qty 30, 30d supply, fill #7
  Filled 2024-07-05: qty 30, 30d supply, fill #8
  Filled 2024-08-03: qty 30, 30d supply, fill #9
  Filled 2024-09-01 – 2024-09-03 (×2): qty 30, 30d supply, fill #10

## 2023-10-27 NOTE — Progress Notes (Signed)
Subjective:  Chief complaint follow-up for HIV disease on medications  Patient ID: Bruce Yang, male    DOB: 04/01/1971, 52 y.o.   MRN: [id number]  HPI  Bruce Yang is a 53 year old man recently admitted the hospital with a stroke in the context of hypertension and substance abuse.  His HIV test was done as part of the algorithm for admission testing to the medicine service and this was positive.  I started him on Dovato as an inpatient and make sure that he had testing for genotype resistance including integrates resistance as well as hepatitis B which she did not have.  He has tolerated the Dovato without difficulty though his sleep-wake schedule seems a bit out of sorts recently he was concerned that his propionic might have been contributing to this and he had stopped this.  He also appears to have stopped his Plavix due to his concerns about risk of bleeding.  His girlfriend accompanied him today so that she could be tested in our clinic as well for HIV  Past Medical History:  Diagnosis Date   Recurrent boils of anus    Stroke [facility])     No past surgical history on file.  Family History  Problem Relation Age of Onset   Other Mother        Homicide   Hypertension Maternal Aunt    Diabetes Maternal Aunt    Cancer Maternal Uncle        Multiple myeloma      Social History   Socioeconomic History   Marital status: Significant Other    Spouse name: Not on file   Number of children: 8   Years of education: Not on file   Highest education level: Associate degree: occupational, Scientist, product/process development, or vocational program  Occupational History   Occupation: Psychologist, occupational  Tobacco Use   Smoking status: Every Day    Current packs/day: 1.00    Types: Cigarettes   Smokeless tobacco: Never  Vaping Use   Vaping status: Never Used  Substance and Sexual Activity   Alcohol use: Not Currently    Alcohol/week: 2.0 standard drinks of alcohol    Types: 2 Shots of liquor per week   Drug use:  Not Currently    Frequency: 5.0 times per week    Types: Marijuana   Sexual activity: Yes  Other Topics Concern   Not on file  Social History Narrative   Not on file   Social Drivers of Health   Financial Resource Strain: Not on file  Food Insecurity: No Food Insecurity (10/14/2023)   Hunger Vital Sign    Worried About Running Out of Food in the Last Year: Never true    Ran Out of Food in the Last Year: Never true  Transportation Needs: Patient Declined (10/14/2023)   PRAPARE - Administrator, Civil Service (Medical): Patient declined    Lack of Transportation (Non-Medical): Patient declined  Physical Activity: Not on file  Stress: Not on file  Social Connections: Not on file    Allergies  Allergen Reactions   Antihistamines, Chlorpheniramine-Type     Affects Prostate      Current Outpatient Medications:    acetaminophen (TYLENOL) 500 MG tablet, Take 1-2 tablets (500-1,000 mg total) by mouth every 6 (six) hours as needed for mild pain (pain score 1-3) or moderate pain (pain score 4-6)., Disp: , Rfl:    amLODipine-benazepril (LOTREL) 5-10 MG capsule, Take 1 capsule by mouth daily., Disp: 30 capsule, Rfl: 2  aspirin EC 81 MG tablet, Take 1 tablet (81 mg total) by mouth daily. Swallow whole., Disp: 90 tablet, Rfl: 0   atorvastatin (LIPITOR) 80 MG tablet, Take 1 tablet (80 mg total) by mouth daily., Disp: 90 tablet, Rfl: 0   buPROPion (WELLBUTRIN SR) 150 MG 12 hr tablet, Take 1 tablet (150 mg total) by mouth daily for 3 days, THEN 1 tablet (150 mg total) 2 (two) times daily., Disp: 60 tablet, Rfl: 2   clopidogrel (PLAVIX) 75 MG tablet, Take 1 tablet (75 mg total) by mouth daily., Disp: 88 tablet, Rfl: 0   dolutegravir-lamiVUDine (DOVATO) 50-300 MG tablet, Take 1 tablet by mouth daily., Disp: 30 tablet, Rfl: 0   folic acid (FOLVITE) 1 MG tablet, Take 1 tablet (1 mg total) by mouth daily., Disp: 30 tablet, Rfl: 1   Multiple Vitamin (MULTIVITAMIN WITH MINERALS) TABS tablet,  Take 1 tablet by mouth daily. (Patient not taking: Reported on 10/21/2023), Disp: , Rfl:    tadalafil (CIALIS) 10 MG tablet, Take 1 tablet (10 mg total) by mouth daily as needed for erectile dysfunction., Disp: 10 tablet, Rfl: 3   thiamine (VITAMIN B1) 100 MG tablet, Take 1 tablet (100 mg total) by mouth daily. (Patient not taking: Reported on 10/21/2023), Disp: 30 tablet, Rfl: 1    Review of Systems  Constitutional:  Negative for activity change, appetite change, chills, diaphoresis, fatigue, fever and unexpected weight change.  HENT:  Negative for congestion, rhinorrhea, sinus pressure, sneezing, sore throat and trouble swallowing.   Eyes:  Negative for photophobia and visual disturbance.  Respiratory:  Negative for cough, chest tightness, shortness of breath, wheezing and stridor.   Cardiovascular:  Negative for chest pain, palpitations and leg swelling.  Gastrointestinal:  Negative for abdominal distention, abdominal pain, anal bleeding, blood in stool, constipation, diarrhea, nausea and vomiting.  Genitourinary:  Negative for difficulty urinating, dysuria, flank pain and hematuria.  Musculoskeletal:  Negative for arthralgias, back pain, gait problem, joint swelling and myalgias.  Skin:  Negative for color change, pallor, rash and wound.  Neurological:  Negative for dizziness, tremors, weakness and light-headedness.  Hematological:  Negative for adenopathy. Does not bruise/bleed easily.  Psychiatric/Behavioral:  Negative for agitation, behavioral problems, confusion, decreased concentration, dysphoric mood, sleep disturbance and suicidal ideas.        Objective:   Physical Exam Constitutional:      Appearance: He is well-developed.  HENT:     Head: Normocephalic and atraumatic.  Eyes:     Conjunctiva/sclera: Conjunctivae normal.  Cardiovascular:     Rate and Rhythm: Normal rate and regular rhythm.  Pulmonary:     Effort: Pulmonary effort is normal. No respiratory distress.      Breath sounds: No wheezing.  Abdominal:     General: There is no distension.     Palpations: Abdomen is soft.  Musculoskeletal:        General: No tenderness. Normal range of motion.     Cervical back: Normal range of motion and neck supple.  Skin:    General: Skin is warm and dry.     Coloration: Skin is not pale.     Findings: No erythema or rash.  Neurological:     General: No focal deficit present.     Mental Status: He is alert and oriented to person, place, and time.  Psychiatric:        Mood and Affect: Mood normal.        Behavior: Behavior normal.  Thought Content: Thought content normal.        Judgment: Judgment normal.           Assessment & Plan:   HIV disease:  I will recheck a viral load with reflex to genotype.  I did look in the [facility] to see what the results of his conventional genotype was but this was not back his integrase showed no integrase strand transfer inhibitor mutations.  We will continue Dovato.  We will check his girlfriend for HIV with a fourth-generation assay as well as an HIV RNA test  Substance abuse: He admits to marijuana use but says he has stopped smoking. 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  He supposed be taking Plavix as well but not been taking this.  Needs to follow-up with neurology and primary care.  Former smoker he was on bupropion but has stopped  this due to concerns that his causing problems his sleep-wake schedule.  Vaccine counseling: Commended flu COVID and Prevnar today which he received

## 2023-10-27 NOTE — Progress Notes (Signed)
Specialty Pharmacy Initiation Note   Bruce Yang is a 53 y.o. male who will be followed by the specialty pharmacy service for RxSp HIV    Review of administration, indication, effectiveness, safety, potential side effects, storage/disposable, and missed dose instructions occurred today for patient's specialty medication(s) Dolutegravir-lamiVUDine (Dovato)     Patient/Caregiver did not have any additional questions or concerns.   Patient's therapy is appropriate to: Initiate    Goals Addressed             This Visit's Progress    Achieve Undetectable HIV Viral Load < 20       Patient is initiating therapy. Patient will work on increased adherence.      Improve or maintain quality of life       Patient is initiating therapy. Patient will be monitored by provider to determine if a change in treatment plan is warranted.      Maintain optimal adherence to therapy       Patient is initiating therapy. Patient will work on increased adherence.        Christiane Sistare L. Maxfield Gildersleeve, PharmD, BCIDP, AAHIVP, CPP Clinical Pharmacist Practitioner Infectious Diseases Clinical Pharmacist [facility] 10/27/2023, 1:56 PM

## 2023-10-27 NOTE — Progress Notes (Addendum)
Specialty Pharmacy Initial Fill Coordination Note  Bruce Yang is a 53 y.o. male contacted today regarding initial fill of specialty medication(s) Dolutegravir-lamiVUDine (Dovato)   Patient requested Delivery   Delivery date: 11/07/23   Verified address: [address] [place] [address]   Medication will be filled on 11/06/23.   Patient is aware of $0 copayment.

## 2023-10-27 NOTE — Addendum Note (Signed)
Addended by: Clayborne Artist A on: 10/27/2023 04:19 PM   Modules accepted: Orders

## 2023-10-28 ENCOUNTER — Telehealth: Payer: Self-pay

## 2023-10-28 NOTE — Telephone Encounter (Signed)
Copied from CRM [id number]. Topic: Clinical - Prescription Issue >> Oct 28, 2023 11:09 AM Bruce Yang wrote: Reason for CRM: Pt girlfriend called stating she did not receive call back for a medication issue-tadalafil  Spoke to patient and he didn't know why she was calling for which medication.  Called Bruce Yang, girlfriend, back and his insurance will not cover the Tadalifil.  Advised her to use the Good Rx App for cash prices.  Dm/cma

## 2023-10-29 LAB — HIV GENOSURE(R) MG

## 2023-10-29 LAB — HIV-1 RNA, PCR (GRAPH) RFX/GENO EDI
HIV-1 RNA BY PCR: 72900 {copies}/mL
HIV-1 RNA Quant, Log: 4.863 {Log}

## 2023-10-29 LAB — REFLEX TO GENOSURE(R) MG EDI: HIV GenoSure(R): 1

## 2023-10-30 ENCOUNTER — Telehealth: Payer: Self-pay

## 2023-10-30 NOTE — Telephone Encounter (Signed)
Attempted to call patient regarding results. Not able to reach him at this time Voicemail full and not accepting new messages. Juanita Laster, RMA

## 2023-10-30 NOTE — Therapy (Incomplete)
OUTPATIENT OCCUPATIONAL THERAPY NEURO EVALUATION  Patient Name: Bruce Yang MRN: [id number] DOB:[birth year], 53 y.o., male Today's Date: 10/30/2023  PCP: Dr. Herbie Drape REFERRING PROVIDER: Dorcas Carrow, MD  END OF SESSION:   Past Medical History:  Diagnosis Date   Hyperlipidemia    Hypertension    Recurrent boils of anus    Stroke [facility])    No past surgical history on file. Patient Active Problem List   Diagnosis Date Noted   Essential hypertension 10/21/2023   Erectile dysfunction 10/21/2023   Aortic atherosclerosis (HCC) 10/21/2023   Carotid artery disease (HCC) 10/21/2023   G6PD deficiency 10/17/2023   HIV disease (HCC) 10/15/2023   Therapeutic drug monitoring 10/15/2023   Alcohol use 10/14/2023   Substance abuse (HCC) 10/14/2023   Cardiac murmur 10/14/2023   Methamphetamine use (HCC) 10/14/2023   Acute CVA (cerebrovascular accident) (HCC) 10/13/2023   Spondylolisthesis, lumbosacral region 09/25/2023   Prediabetes 08/20/2023   Hyperlipidemia 08/20/2023   Hyperglycemia 08/19/2023   Chronic low back pain without sciatica 08/19/2023   Peripheral neuropathy 08/19/2023   Tobacco use disorder 08/19/2023   Marijuana use, continuous 08/19/2023    ONSET DATE: ***  REFERRING DIAG: I63.9 (ICD-10-CM) - Cerebrovascular accident (CVA), unspecified mechanism (HCC)  THERAPY DIAG:  No diagnosis found.  Rationale for Evaluation and Treatment: Rehabilitation  SUBJECTIVE:   SUBJECTIVE STATEMENT: *** Pt accompanied by: {accompnied:27141}  PERTINENT HISTORY: 53 y.o. male with medical history significant of polysubstance abuse, chronic low back pain presented to ED with complaint of numbness of his left face/arm/leg X 3 days. Labs showed ethanol level 164, UDS positive for amphetamines and THC. CT head showing new low density within the anterior right thalamus consistent with an infarction, exact age indeterminate. Brain MRI showing a 1 cm acute ischemic nonhemorrhagic  right thalamic infarct and a 2 cm acute to early subacute ischemic nonhemorrhagic left basal ganglia infarct.  PMH:  HIV positive    PRECAUTIONS: {Therapy precautions:24002}  WEIGHT BEARING RESTRICTIONS: {Yes ***/No:24003}  PAIN:  Are you having pain? {OPRCPAIN:27236}  FALLS: Has patient fallen in last 6 months? {fallsyesno:27318}  LIVING ENVIRONMENT: Lives with: {OPRC lives with:25569::"lives with their family"} Lives in: {Lives in:25570} Stairs: {opstairs:27293} Has following equipment at home: {Assistive devices:23999}  PLOF: {PLOF:24004}  PATIENT GOALS: ***  OBJECTIVE:  Note: Objective measures were completed at Evaluation unless otherwise noted.  HAND DOMINANCE: {MISC; OT HAND [phone]}  ADLs: Overall ADLs: *** Transfers/ambulation related to ADLs: Eating: *** Grooming: *** UB Dressing: *** LB Dressing: *** Toileting: *** Bathing: *** Tub Shower transfers: *** Equipment: {equipment:25573}  IADLs: Shopping: *** Light housekeeping: *** Meal Prep: *** Community mobility: *** Medication management: *** Financial management: *** Handwriting: {OTWRITTENEXPRESSION:25361}  MOBILITY STATUS: {OTMOBILITY:25360}  POSTURE COMMENTS:  {posture:25561} Sitting balance: {sitting balance:25483}  ACTIVITY TOLERANCE: Activity tolerance: ***  FUNCTIONAL OUTCOME MEASURES: {OTFUNCTIONALMEASURES:27238}  UPPER EXTREMITY ROM:    {AROM/PROM:27142} ROM Right eval Left eval  Shoulder flexion    Shoulder abduction    Shoulder adduction    Shoulder extension    Shoulder internal rotation    Shoulder external rotation    Elbow flexion    Elbow extension    Wrist flexion    Wrist extension    Wrist ulnar deviation    Wrist radial deviation    Wrist pronation    Wrist supination    (Blank rows = not tested)  UPPER EXTREMITY MMT:     MMT Right eval Left eval  Shoulder flexion    Shoulder abduction    Shoulder  adduction    Shoulder extension     Shoulder internal rotation    Shoulder external rotation    Middle trapezius    Lower trapezius    Elbow flexion    Elbow extension    Wrist flexion    Wrist extension    Wrist ulnar deviation    Wrist radial deviation    Wrist pronation    Wrist supination    (Blank rows = not tested)  HAND FUNCTION: {handfunction:27230}  COORDINATION: {otcoordination:27237}  SENSATION: {sensation:27233}  EDEMA: ***  MUSCLE TONE: {UETONE:25567}  COGNITION: Overall cognitive status: {cognition:24006}  VISION: Subjective report: *** Baseline vision: {OTBASELINEVISION:25363} Visual history: {OTVISUALHISTORY:25364}  VISION ASSESSMENT: {visionassessment:27231}  Patient has difficulty with following activities due to following visual impairments: ***  PERCEPTION: {Perception:25564}  PRAXIS: {Praxis:25565}  OBSERVATIONS: ***                                                                                                                             TREATMENT DATE: ***         PATIENT EDUCATION: Education details: *** Person educated: {Person educated:25204} Education method: {Education Method:25205} Education comprehension: {Education Comprehension:25206}  HOME EXERCISE PROGRAM: ***   GOALS: Goals reviewed with patient? {yes/no:20286}  SHORT TERM GOALS: Target date: ***  *** Baseline: Goal status: INITIAL  2.  *** Baseline:  Goal status: INITIAL  3.  *** Baseline:  Goal status: INITIAL  4.  *** Baseline:  Goal status: INITIAL  5.  *** Baseline:  Goal status: INITIAL  6.  *** Baseline:  Goal status: INITIAL  LONG TERM GOALS: Target date: ***  *** Baseline:  Goal status: INITIAL  2.  *** Baseline:  Goal status: INITIAL  3.  *** Baseline:  Goal status: INITIAL  4.  *** Baseline:  Goal status: INITIAL  5.  *** Baseline:  Goal status: INITIAL  6.  *** Baseline:  Goal status: INITIAL  ASSESSMENT:  CLINICAL  IMPRESSION: Patient is a *** y.o. *** who was seen today for occupational therapy evaluation for ***.   PERFORMANCE DEFICITS: in functional skills including {OT physical skills:25468}, cognitive skills including {OT cognitive skills:25469}, and psychosocial skills including {OT psychosocial skills:25470}.   IMPAIRMENTS: are limiting patient from {OT performance deficits:25471}.   CO-MORBIDITIES: {Comorbidities:25485} that affects occupational performance. Patient will benefit from skilled OT to address above impairments and improve overall function.  MODIFICATION OR ASSISTANCE TO COMPLETE EVALUATION: {OT modification:25474}  OT OCCUPATIONAL PROFILE AND HISTORY: {OT PROFILE AND HISTORY:25484}  CLINICAL DECISION MAKING: {OT CDM:25475}  REHAB POTENTIAL: {rehabpotential:25112}  EVALUATION COMPLEXITY: {Evaluation complexity:25115}    PLAN:  OT FREQUENCY: {rehab frequency:25116}  OT DURATION: {rehab duration:25117}  PLANNED INTERVENTIONS: {OT Interventions:25467}  RECOMMENDED OTHER SERVICES: ***  CONSULTED AND AGREED WITH PLAN OF CARE: {ZHY:86578}  PLAN FOR NEXT SESSION: ***   Willa Frater, OTR/L 10/30/2023, 1:51 PM

## 2023-10-30 NOTE — Telephone Encounter (Signed)
-----   Message from [place] sent at 10/30/2023 12:09 PM EST ----- Regarding: FW: His viral load has not come down as much as I would expect w 2 weeks of Dovato. If he is not so doing he needs to tighten up on daily religiously taking his meds. His girlfriend is interested in prep ----- Message ----- From: Leory Plowman, Lab In [place] Sent: 10/15/2023   6:35 PM EST To: Randall Hiss, MD

## 2023-10-31 ENCOUNTER — Ambulatory Visit: Payer: BLUE CROSS/BLUE SHIELD

## 2023-10-31 ENCOUNTER — Ambulatory Visit: Payer: Medicaid Other | Admitting: Occupational Therapy

## 2023-10-31 ENCOUNTER — Telehealth: Payer: Self-pay | Admitting: Radiology

## 2023-10-31 ENCOUNTER — Ambulatory Visit: Payer: BLUE CROSS/BLUE SHIELD | Admitting: Speech Pathology

## 2023-10-31 ENCOUNTER — Ambulatory Visit: Payer: BLUE CROSS/BLUE SHIELD | Admitting: Occupational Therapy

## 2023-10-31 NOTE — Telephone Encounter (Signed)
Bruce Yang called triage line requesting earlier appointment with Dr. Christell Constant. He was originally scheduled for 4 week follow up on 11/10/2023.  He started experiencing excruciating pain yesterday that has worsened throughout the night. He is at the point this morning that he can hardly walk and is unable to get out of bed. There was no new injury, however, the pain started at his neck yesterday and has now shifted into his back. Appt time was open for 11/05/2023 at 0815 so I rescheduled patient to that appointment.  Patient requested note sent to Dr. Christell Constant to see if there is anything else that he can offer or would like patient to do prior to next weeks appointment. He has had tylenol this morning. [facility] [address]  Please advise. CB [phone]

## 2023-11-01 LAB — HIV RNA, RTPCR W/R GT (RTI, PI,INT)
HIV 1 RNA Quant: 202 {copies}/mL — ABNORMAL HIGH
HIV-1 RNA Quant, Log: 2.31 {Log} — ABNORMAL HIGH

## 2023-11-03 ENCOUNTER — Ambulatory Visit: Payer: BLUE CROSS/BLUE SHIELD | Admitting: Occupational Therapy

## 2023-11-03 ENCOUNTER — Telehealth: Payer: Self-pay

## 2023-11-03 DIAGNOSIS — M6281 Muscle weakness (generalized): Secondary | ICD-10-CM

## 2023-11-03 DIAGNOSIS — R2689 Other abnormalities of gait and mobility: Secondary | ICD-10-CM

## 2023-11-03 DIAGNOSIS — R41840 Attention and concentration deficit: Secondary | ICD-10-CM

## 2023-11-03 DIAGNOSIS — R41844 Frontal lobe and executive function deficit: Secondary | ICD-10-CM

## 2023-11-03 DIAGNOSIS — I6389 Other cerebral infarction: Secondary | ICD-10-CM | POA: Diagnosis not present

## 2023-11-03 DIAGNOSIS — R278 Other lack of coordination: Secondary | ICD-10-CM

## 2023-11-03 DIAGNOSIS — M79602 Pain in left arm: Secondary | ICD-10-CM

## 2023-11-03 DIAGNOSIS — M79601 Pain in right arm: Secondary | ICD-10-CM

## 2023-11-03 NOTE — Telephone Encounter (Signed)
Attempted to contact patient via phone, no answer and unable to leave voice message. Will communicate lab results via Mychart. .me

## 2023-11-03 NOTE — Telephone Encounter (Signed)
-----   Message from [place] sent at 11/01/2023  2:31 PM EST ----- Regarding: FW: Patients HIV coming under control on Dovato. He deserves a lot of pats on back for doing a great job. He is almost already "undetectable" ----- Message ----- From: Interface, [facility] Results In Sent: 11/01/2023   6:16 AM EST To: Randall Hiss, MD

## 2023-11-03 NOTE — Therapy (Signed)
OUTPATIENT OCCUPATIONAL THERAPY NEURO EVALUATION  Patient Name: Bruce Yang MRN: [id number] DOB:1971-04-01, 53 y.o., male Today's Date: 11/03/2023  PCP: Dr. Elsie Saas PROVIDER: Dr.Ghmire  END OF SESSION:  OT End of Session - 11/03/23 1652     Visit Number 1    Number of Visits 11    Date for OT Re-Evaluation 01/19/24    Authorization Type [employer]    Authorization Time Period 11 weeks    OT Start Time 1113    OT Stop Time 1145    OT Time Calculation (min) 32 min    Activity Tolerance Patient tolerated treatment well    Behavior During Therapy WFL for tasks assessed/performed;Flat affect             Past Medical History:  Diagnosis Date   Hyperlipidemia    Hypertension    Recurrent boils of anus    Stroke (HCC)    No past surgical history on file. Patient Active Problem List   Diagnosis Date Noted   Essential hypertension 10/21/2023   Erectile dysfunction 10/21/2023   Aortic atherosclerosis (HCC) 10/21/2023   Carotid artery disease (HCC) 10/21/2023   G6PD deficiency 10/17/2023   HIV disease (HCC) 10/15/2023   Therapeutic drug monitoring 10/15/2023   Alcohol use 10/14/2023   Substance abuse (HCC) 10/14/2023   Cardiac murmur 10/14/2023   Methamphetamine use (HCC) 10/14/2023   Acute CVA (cerebrovascular accident) (HCC) 10/13/2023   Spondylolisthesis, lumbosacral region 09/25/2023   Prediabetes 08/20/2023   Hyperlipidemia 08/20/2023   Hyperglycemia 08/19/2023   Chronic low back pain without sciatica 08/19/2023   Peripheral neuropathy 08/19/2023   Tobacco use disorder 08/19/2023   Marijuana use, continuous 08/19/2023    ONSET DATE: 10/13/23  REFERRING DIAG: 63.9 (ICD-10-CM) - Cerebrovascular accident (CVA), unspecified mechanism (HCC)   THERAPY DIAG:  Muscle weakness (generalized) - Plan: Ot plan of care cert/re-cert  Other abnormalities of gait and mobility - Plan: Ot plan of care cert/re-cert  Other lack of coordination - Plan: Ot plan of care  cert/re-cert  Pain in right arm - Plan: Ot plan of care cert/re-cert  Pain in left arm - Plan: Ot plan of care cert/re-cert  Attention and concentration deficit - Plan: Ot plan of care cert/re-cert  Frontal lobe and executive function deficit - Plan: Ot plan of care cert/re-cert  Rationale for Evaluation and Treatment: Rehabilitation  SUBJECTIVE:   SUBJECTIVE STATEMENT: Pt reports he wants to get stronger Pt accompanied by: significant other  PERTINENT HISTORY: 53 y.o. male with medical history significant of polysubstance abuse, chronic low back pain presented to ED with complaint of numbness of his left face/arm/leg X 3 days.  In the ED, vital signs fairly stable.  Labs showed ethanol level 164, UDS positive for amphetamines and THC.  CT head showing new low density within the anterior right thalamus consistent with an infarction, exact age indeterminate. Brain MRI showing a 1 cm acute ischemic nonhemorrhagic right thalamic infarct and a 2 cm acute to early subacute ischemic nonhemorrhagic left basal ganglia infarct.   First stroke was March 31st, second one was 10/13/23  PMH: newly disagnosed HIV, hypertension, hyperlipidemia  DIAGNOSTIC FINDINGS:  CT HEAD AND NECK 1. Areas of acute/subacute infarcts are better demonstrated on recent MRI. 2. Remote infarcts in the thalami and left basal ganglia. 3. Severe stenosis at the petrous segment of the right ICA and moderate at the paraclinoid segment. 4. Diffuse decrease in caliber of the cervical right ICA related to upstream stenosis. 5. Focal moderate stenosis at  the right P1-P2 junction and mild stenosis at the proximal P2 segment of the left PCA. 6. Aortic atherosclerosis.     MRI C-SPINE 1. Motion degraded exam. 2. No definite acute abnormality within the cervical spine or spinal cord. 3. Central disc protrusion at C3-4 with resultant mild spinal stenosis. 4. Multifactorial degenerative changes with resultant multilevel foraminal  narrowing as above. Notable findings include mild to moderate left C5 foraminal stenosis, moderate to severe right with mild left C6 foraminal narrowing, with moderate right C7 foraminal stenosis.   BRAIN MRI 1. 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 2. 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 3. 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    PRECAUTIONS: Fall and Other: HIV - newly diagnosed  WEIGHT BEARING RESTRICTIONS: No  PAIN:  Are you having pain? Yes: NPRS scale: 5/10 Pain location: left leg Pain description: aching Aggravating factors: ongoing Relieving factors: unknown No arm pain at time of eval however pt reports moderate pain at times in UE's  FALLS: Has patient fallen in last 6 months? No  LIVING ENVIRONMENT: Lives with: lives with their family and lives with an adult companion Lives in: House/apartment Stairs: Yes: External:    PLOF: Independent  PATIENT GOALS: get stronger  OBJECTIVE:  Note: Objective measures were completed at Evaluation unless otherwise noted.  HAND DOMINANCE: Left  ADLs: Overall ADLs: requires assistance Transfers/ambulation related to ADLs: Eating: mod I  Grooming: mod I UB Dressing: min A LB Dressing: minA Toileting: mod I / supervision with RW  Bathing:mod  A Tub Shower transfers: min A Equipment: none  IADLs: Pt has assistance with all IADLs  Light housekeeping: dependent Meal Prep: dependent   MOBILITY STATUS:  mod I to supervision    FUNCTIONAL OUTCOME MEASURES: Quick Dash: 77% disability  UPPER EXTREMITY ROM:    Active ROM Right eval Left eval  Shoulder flexion 130 110  Shoulder abduction  115  Shoulder adduction    Shoulder extension    Shoulder internal rotation    Shoulder external rotation    Elbow flexion    Elbow extension -20 -30  Wrist flexion    Wrist extension     Wrist ulnar deviation    Wrist radial deviation    Wrist pronation    Wrist supination    (Blank rows = not tested)  Grip strength: Right: 40 lbs; Left: 60 lbs  COORDINATION: 9 Hole Peg test: Right: 1 min 17 secs sec; Left: 42.87 sec  SENSATION: Light touch: Impaired for LUE    COGNITION: Overall cognitive status: Impaired, ot be further assessed in a functional context, pt has decreased safety awareness.    VISION ASSESSMENT: Not tested    OBSERVATIONS: Pleasant gentleman accompanied by his GF  TREATMENT DATE: 11/03/23 eval         PATIENT EDUCATION: Education details: role of OT, potential goals. Person educated: Patient and girlfriend Education method: Explanation Education comprehension: verbalized understanding  HOME EXERCISE PROGRAM: n/a   GOALS: Goals reviewed with patient? Yes  SHORT TERM GOALS: Target date: 12/04/23  I  with inital HEP Baseline:dependent Goal status: INITIAL  2.  Pt will perform bathing with min A Baseline: mod A Goal status: INITIAL  3.  Pt will perfrom dressing with supervision Baseline: min A Goal status: INITIAL  4. Pt will demonstrate improved LUE coordination as eveidenced by decreasing 9 hole peg test score to 39 secs or less Baseline:LUE  42.87 Goal status: INITIAL  5.  Pt will demonstrate improved RUE coordination as eveidenced by decreasing 9 hole peg test score to 70 secs or less Baseline: RUE 1 min 17 secs Goal status: INITIAL  6.  I with sensory precautions for LUE. Baseline: dependent Goal status: INITIAL 7.Pt will retieve a lightweight object at 115 shoulder flexion and -25 elbow extension with LUE.  Baseline: shoulder flexion 110, elbow ext -30  LONG TERM GOALS: Target date: 01/19/24  I with updated HEP Baseline: dependent Goal status: INITIAL  2.  Pt will demonstrate  improved RUE coordination as eveidenced by decreasing 9 hole peg test score to 65 secs or less Baseline: RUE 1 min 17 secs Goal status: INITIAL  3.  Pt will increase RUE grip strength to 45 lbs or greater for increased RUE functional use. Baseline:  grip strength RUE 40 lbs, LUE 60 lbs Goal status: INITIAL  4.  Pt will perform all basic ADLS with supervision Baseline: min-mod A Goal status: INITIAL  5.  Pt will perform light home management with supervision. Baseline: dependent Goal status: INITIAL  6.  Pt will improve Quick Dash core to 72% or better disability Baseline: 77% Goal status: INITIAL  ASSESSMENT:  CLINICAL IMPRESSION: Patient is a 53 y.o. male who was seen today for occupational therapy evaluation for CVA. Pt. with medical history significant of polysubstance abuse, chronic low back pain presented to ED with complaint of numbness of his left face/arm/leg X 3 days.  In the ED, vital signs fairly stable.  Labs showed ethanol level 164, UDS positive for amphetamines and THC.  CT head showing new low density within the anterior right thalamus consistent with an infarction, exact age indeterminate. Brain MRI showing a 1 cm acute ischemic nonhemorrhagic right thalamic infarct and a 2 cm acute to early subacute ischemic nonhemorrhagic left basal ganglia infarct.   First stroke was March 31st, second one was 10/13/23  PMH: newly disagnosed HIV, hypertension, hyperlipidemia Pt presents with the following deficits: decreased strength, decreased coordination, decreased balance, cognitve deficits, pain which impedes perfromance of ADLS/IADLs. Pt canbenefit from skilled occupational therapy to address these deficits in order to maximize pt's safety and I with daily activities.  PERFORMANCE DEFICITS: in functional skills including ADLs, IADLs, coordination, dexterity, proprioception, sensation, tone, ROM, strength, pain, flexibility, Fine motor control, Gross motor control, mobility, balance,  endurance, decreased knowledge of precautions, decreased knowledge of use of DME, and UE functional use, cognitive skills including attention, problem solving, safety awareness, thought, and understand, and psychosocial skills including coping strategies, environmental adaptation, habits, interpersonal interactions, and routines and behaviors.   IMPAIRMENTS: are limiting patient from ADLs, IADLs, rest and sleep, work, play, leisure, and social participation.   CO-MORBIDITIES: may have co-morbidities  that affects occupational performance. Patient will benefit from skilled OT  to address above impairments and improve overall function.  MODIFICATION OR ASSISTANCE TO COMPLETE EVALUATION: No modification of tasks or assist necessary to complete an evaluation.  OT OCCUPATIONAL PROFILE AND HISTORY: Detailed assessment: Review of records and additional review of physical, cognitive, psychosocial history related to current functional performance.  CLINICAL DECISION MAKING: LOW - limited treatment options, no task modification necessary  REHAB POTENTIAL: Good  EVALUATION COMPLEXITY: Low    PLAN:  OT FREQUENCY: 1x/week plus eval  OT DURATION:11 weeks  PLANNED INTERVENTIONS: 97168 OT Re-evaluation, 97535 self care/ADL training, 08657 therapeutic exercise, 97530 therapeutic activity, 97112 neuromuscular re-education, 97140 manual therapy, 97035 ultrasound, 97018 paraffin, 84696 moist heat, 97010 cryotherapy, 97034 contrast bath, 97129 Cognitive training (first 15 min), 29528 Cognitive training(each additional 15 min), 41324 Orthotics management and training, 40102 Splinting (initial encounter), passive range of motion, balance training, functional mobility training, visual/perceptual remediation/compensation, energy conservation, coping strategies training, patient/family education, and DME and/or AE instructions  RECOMMENDED OTHER SERVICES: PT, ST  CONSULTED AND AGREED WITH PLAN OF CARE: Patient and  family member/caregiver  PLAN FOR NEXT SESSION: inital HEP for coordination, closed chain ROM   Berit Raczkowski, OT 11/03/2023, 5:22 PM

## 2023-11-04 MED ORDER — PREGABALIN 75 MG PO CAPS
75.0000 mg | ORAL_CAPSULE | Freq: Two times a day (BID) | ORAL | 0 refills | Status: DC
Start: 2023-11-04 — End: 2023-11-19

## 2023-11-04 NOTE — Addendum Note (Signed)
Addended by: Willia Craze on: 11/04/2023 10:36 AM   Modules accepted: Orders

## 2023-11-04 NOTE — Telephone Encounter (Signed)
Patient states that he would like to try Lyrica. [facility] [address]

## 2023-11-05 ENCOUNTER — Ambulatory Visit (INDEPENDENT_AMBULATORY_CARE_PROVIDER_SITE_OTHER): Payer: BLUE CROSS/BLUE SHIELD | Admitting: Orthopedic Surgery

## 2023-11-05 DIAGNOSIS — M5412 Radiculopathy, cervical region: Secondary | ICD-10-CM | POA: Diagnosis not present

## 2023-11-05 NOTE — Progress Notes (Signed)
Orthopedic Spine Surgery Office Note   Assessment: Patient is a 53 y.o. male with 2 issues:   1) neck pain that radiates into his right upper extremity along the radial aspect, suspect radiculopathy 2) low back pain that radiates into bilateral legs in multiple distributions but most pain is along the lateral aspect, has a L5/S1 isthmic spondylolisthesis - suspect radiculopathy     Plan: -Patient has tried PT, tylenol, meloxicam, lidocaine patches -Prescribed lyrica for additional pain relief -He can continue to use tylenol and meloxicam for pain relief -Recommended diagnostic/therapeutic cervical ESI -Would want a new MRI of his cervical spine before any operative since the axial cuts are not diagnostic due to motion artifact  -No evidence of myelopathy on his cervical spine so surgery would be elective in nature -Needs to be nicotine free prior to any elective spine surgery -Patient should return to office in 6 weeks, x-rays at next visit: none     Patient expressed understanding of the plan and all questions were answered to the patient's satisfaction.    ___________________________________________________________________________     History:   Patient is a 53 y.o. male who presents today for cervical and lumbar spine.  Patient had significant worsening of his neck pain last week. It is doing better now. He is still having pain that starts in his neck and radiates into his right upper extremity. He feels it going into his anterior aspect of his arm into his dorsal forearm. He has not developed any new symptoms since he was last seen. If note, he has been diagnosed with a stroke and has several ischemic infarcts on his MRI brain.   He is also still having low back pain that is bothering him. He said it is not as significant as his neck pain. The pain radiates from his back into his right leg in multiple distributions. In the left leg, he feels it going into the lateral aspect of his  thigh and leg. The pain radiates to the level of the ankle on both sides. Has not developed any new lower extremity or low back pain since he was last in the office.    Treatments tried: PT, tylenol, meloxicam, lidocaine patches     Physical Exam:   General: no acute distress, appears stated age Neurologic: alert, answering questions appropriately, following commands Respiratory: unlabored breathing on room air, symmetric chest rise Psychiatric: appropriate affect, normal cadence to speech     MSK (spine):   -Strength exam                                                   Left                  Right Grip strength                5/5                  4+/5 Interosseus                  5/5                  5/5 Wrist extension            5/5                  5/5  Wrist flexion                 5/5                  5/5 Elbow flexion                5/5                  5/5 Deltoid                          5/5                  3/5   EHL                              4/5                  4/5 TA                                 5/5                  5/5 GSC                             5/5                  5/5 Knee extension            5/5                  5/5 Hip flexion                    5/5                  5/5   -Sensory exam                           Sensation intact to light touch in L3-S1 nerve distributions of bilateral lower extremities             Sensation intact to light touch in C5-T1 nerve distributions of bilateral upper extremities   -Brachioradialis DTR: 2/4 on the left, 2/4 on the right -Biceps DTR: 2/4 on the left, 3/4 on the right -Triceps DTR: 2/4 on the left, 2/4 on the right -Achilles DTR: 2/4 on the left, 2/4 on the right -Patellar tendon DTR: 2/4 on the left, 3/4 on the right   -Spurling: Negative bilaterally -Hoffman sign: Positive on the right, negative on the left -Clonus: No beats bilaterally -Interosseous wasting: None seen -Grip and release test:  Positive -Romberg: Negative -Gait: Wide-based -Imbalance with tandem gait: Positive   Imaging: XRs of the cervical spine from 10/13/2023 were previously independently reviewed and interpreted, showing disc height loss with anterior osteophyte formation at C5/6.  No other significant degenerative changes seen.  No evidence of instability on flexion/extension views.  No fracture or dislocation seen.   MRI of the cervical spine from 10/13/2023 was independently reviewed and interpreted, showing DDD and bilateral foraminal stenosis C5/6. Axial cuts significantly degraded by motion artifact. No central stenosis seen or T2 cord signal change.    XRs of the lumbar spine from 10/13/2023 were previously independently reviewed and interpreted,  showing pars defects bilaterally at L5/S1.  Anterior listhesis at L5/S1 that shifts about 0.5 mm between flexion and extension views.  Disc height loss at L5/S1.  No other significant degenerative changes seen.  No fracture or dislocation seen.     Patient name: Bruce Yang Patient MRN: [id number] Date of visit: 11/05/23

## 2023-11-06 ENCOUNTER — Other Ambulatory Visit: Payer: Self-pay

## 2023-11-07 ENCOUNTER — Telehealth: Payer: Self-pay

## 2023-11-07 NOTE — Telephone Encounter (Signed)
Called patient he stated he wasn't sure who is managing his Plavix. Holding on what to do next.

## 2023-11-08 DIAGNOSIS — Z419 Encounter for procedure for purposes other than remedying health state, unspecified: Secondary | ICD-10-CM | POA: Diagnosis not present

## 2023-11-10 ENCOUNTER — Ambulatory Visit: Payer: BLUE CROSS/BLUE SHIELD | Admitting: Orthopedic Surgery

## 2023-11-10 ENCOUNTER — Ambulatory Visit: Payer: Medicaid Other | Admitting: Physical Therapy

## 2023-11-11 ENCOUNTER — Encounter: Payer: Self-pay | Admitting: Internal Medicine

## 2023-11-11 ENCOUNTER — Ambulatory Visit (INDEPENDENT_AMBULATORY_CARE_PROVIDER_SITE_OTHER): Payer: BLUE CROSS/BLUE SHIELD | Admitting: Internal Medicine

## 2023-11-11 VITALS — BP 140/80 | HR 78 | Ht 66.0 in | Wt 157.0 lb

## 2023-11-11 DIAGNOSIS — I639 Cerebral infarction, unspecified: Secondary | ICD-10-CM | POA: Diagnosis not present

## 2023-11-11 DIAGNOSIS — B20 Human immunodeficiency virus [HIV] disease: Secondary | ICD-10-CM

## 2023-11-11 DIAGNOSIS — Z7902 Long term (current) use of antithrombotics/antiplatelets: Secondary | ICD-10-CM | POA: Diagnosis not present

## 2023-11-11 DIAGNOSIS — Z1211 Encounter for screening for malignant neoplasm of colon: Secondary | ICD-10-CM

## 2023-11-11 DIAGNOSIS — Z8673 Personal history of transient ischemic attack (TIA), and cerebral infarction without residual deficits: Secondary | ICD-10-CM

## 2023-11-11 NOTE — Patient Instructions (Signed)
Your provider has ordered Cologuard testing as an option for colon cancer screening. This is performed by [employer] and may be out of network with your insurance. PRIOR to completing the test, it is YOUR responsibility to contact your insurance about covered benefits for this test. Your out of pocket expense could be anywhere from $0.00 to $649.00.   When you call to check coverage with your insurer, please provide the following information:   -The ONLY provider of Cologuard is [facility], dispensing  - CPT code for Cologuard is [phone].  [facility] NPI # [id number]  -[employer] Tax ID # [id number]   We have already sent your demographic and insurance information to [employer] (phone number [phone]) and they should contact you within the next week regarding your test. If you have not heard from them within the next week, please call our office at [phone].  _______________________________________________________  If your blood pressure at your visit was 140/90 or greater, please contact your primary care physician to follow up on this.  _______________________________________________________  If you are age 16 or older, your body mass index should be between 23-30. Your Body mass index is 25.34 kg/m. If this is out of the aforementioned range listed, please consider follow up with your Primary Care Provider.  If you are age 64 or younger, your body mass index should be between 19-25. Your Body mass index is 25.34 kg/m. If this is out of the aformentioned range listed, please consider follow up with your Primary Care Provider.   ________________________________________________________  The [facility] providers would like to encourage you to use [facility] to communicate with providers for non-urgent requests or questions.  Due to long hold times on the telephone, sending your provider a message by [facility] may be a faster and more efficient  way to get a response.  Please allow 48 business hours for a response.  Please remember that this is for non-urgent requests.  _______________________________________________________

## 2023-11-11 NOTE — Progress Notes (Signed)
 Patient ID: Bruce Yang, male   DOB: August 02, 1971, 53 y.o.   MRN: [id number] HPI: Bruce Yang is a 53 year old male with a history of recent stroke on Plavix  and recent HIV diagnosis who presents for a gastroenterology consultation regarding colon health.  He is here today with his girlfriend.  He denies current GI complaint.  He reports regular bowel movements without severe diarrhea or constipation.  No abdominal pain.  He denies a family history of colon cancer.  He recalls a remote colonoscopy performed in [place]  for GI bleeding.  He does not recall etiology but also does not think he had polyps at that time.    He has experienced two strokes in the past few months and is currently on Plavix  to prevent further cerebrovascular events. He is concerned about the risk of another stroke, particularly within the first three to six months post-stroke.  He has a recent diagnosis of HIV and is under the care of Dr. Fleeta Rothman for management. He is currently managing the condition.  He experiences weakness and unsteadiness, which may be related to his recent strokes.  No family history of colon problems.  Past Medical History:  Diagnosis Date   Hyperlipidemia    Hypertension    Recurrent boils of anus    Stroke [facility])     History reviewed. No pertinent surgical history.  Outpatient Medications Prior to Visit  Medication Sig Dispense Refill   acetaminophen  (TYLENOL ) 500 MG tablet Take 1-2 tablets (500-1,000 mg total) by mouth every 6 (six) hours as needed for mild pain (pain score 1-3) or moderate pain (pain score 4-6).     amLODipine -benazepril  (LOTREL ) 5-10 MG capsule Take 1 capsule by mouth daily. 30 capsule 2   aspirin  EC 81 MG tablet Take 1 tablet (81 mg total) by mouth daily. Swallow whole. 90 tablet 0   atorvastatin  (LIPITOR ) 80 MG tablet Take 1 tablet (80 mg total) by mouth daily. 90 tablet 0   buPROPion  (WELLBUTRIN  SR) 150 MG 12 hr tablet Take 1 tablet (150 mg  total) by mouth daily for 3 days, THEN 1 tablet (150 mg total) 2 (two) times daily. 60 tablet 2   clopidogrel  (PLAVIX ) 75 MG tablet Take 1 tablet (75 mg total) by mouth daily. 88 tablet 0   dolutegravir -lamiVUDine  (DOVATO ) 50-300 MG tablet Take 1 tablet by mouth daily. 30 tablet 11   folic acid  (FOLVITE ) 1 MG tablet Take 1 tablet (1 mg total) by mouth daily. 30 tablet 1   Multiple Vitamin (MULTIVITAMIN WITH MINERALS) TABS tablet Take 1 tablet by mouth daily.     pregabalin  (LYRICA ) 75 MG capsule Take 1 capsule (75 mg total) by mouth 2 (two) times daily. 60 capsule 0   tadalafil  (CIALIS ) 10 MG tablet Take 1 tablet (10 mg total) by mouth daily as needed for erectile dysfunction. 10 tablet 3   thiamine  (VITAMIN B1) 100 MG tablet Take 1 tablet (100 mg total) by mouth daily. 30 tablet 1   No facility-administered medications prior to visit.    Allergies  Allergen Reactions   Antihistamines, Chlorpheniramine-Type     Affects Prostate     Family History  Problem Relation Age of Onset   Other Mother        Homicide   Hypertension Maternal Aunt    Diabetes Maternal Aunt    Cancer Maternal Uncle        Multiple myeloma   Liver disease Neg Hx    Esophageal cancer Neg  Hx    Colon cancer Neg Hx     Social History   Tobacco Use   Smoking status: Every Day    Current packs/day: 1.00    Types: Cigarettes   Smokeless tobacco: Never  Vaping Use   Vaping status: Never Used  Substance Use Topics   Alcohol use: Not Currently    Alcohol/week: 2.0 standard drinks of alcohol    Types: 2 Shots of liquor per week   Drug use: Not Currently    Frequency: 5.0 times per week    Types: Marijuana    ROS: As per history of present illness, otherwise negative  BP (!) 140/80   Pulse 78   Ht 5' 6 (1.676 m)   Wt 157 lb (71.2 kg)   BMI 25.34 kg/m  Gen: awake, alert, NAD HEENT: anicteric  Abd: soft, NT/ND, +BS throughout Ext: no c/c/e Neuro: nonfocal   RELEVANT LABS AND IMAGING: CBC     Component Value Date/Time   WBC 3.9 (L) 10/15/2023 0531   RBC 4.07 (L) 10/15/2023 0531   HGB 12.4 (L) 10/15/2023 0951   HGB 12.3 (L) 10/15/2023 0531   HCT 36.2 (L) 10/15/2023 0531   PLT 258 10/15/2023 0531   MCV 88.9 10/15/2023 0531   MCH 30.2 10/15/2023 0531   MCHC 34.0 10/15/2023 0531   RDW 11.8 10/15/2023 0531   LYMPHSABS 1.3 10/13/2023 1625   MONOABS 0.6 10/13/2023 1625   EOSABS 0.0 10/13/2023 1625   BASOSABS 0.0 10/13/2023 1625    CMP     Component Value Date/Time   NA 133 (L) 10/15/2023 0531   K 4.3 10/15/2023 0531   CL 97 (L) 10/15/2023 0531   CO2 24 10/15/2023 0531   GLUCOSE 230 (H) 10/15/2023 0531   BUN 16 10/15/2023 0531   CREATININE 0.94 10/15/2023 0531   CALCIUM  9.8 10/15/2023 0531   PROT 8.1 10/13/2023 1625   ALBUMIN 3.8 10/13/2023 1625   AST 23 10/13/2023 1625   ALT 17 10/13/2023 1625   ALKPHOS 81 10/13/2023 1625   BILITOT 0.5 10/13/2023 1625   GFRNONAA >60 10/15/2023 0531   GFRAA >60 05/23/2020 1009   ASSESSMENT/PLAN: Colon Cancer Screening History of prior remote colonoscopy with bleeding but no known polyps. No current rectal bleeding. Discussed the risk of colonoscopy in the setting of recent stroke and ongoing Plavix  therapy. Decided on non-invasive screening with Cologuard due to recent stroke and the need for ongoing antiplatelet therapy. -Order Cologuard test to be sent to patient's home. -If Cologuard test is positive, consult with neurologist regarding safe interruption of Plavix  for colonoscopy.  Stroke Recent history of two strokes. Currently on Plavix  for secondary prevention. -Continue Plavix  as prescribed.  HIV Recently diagnosed and under the care of Dr. Fleeta Rothman. -Continue current HIV treatment regimen under Dr. Fleeta Flank care.   Rr:Mlii, Garnette HERO, Md [address] [place],  [place] [address]

## 2023-11-13 ENCOUNTER — Encounter: Payer: Self-pay | Admitting: Neurology

## 2023-11-13 ENCOUNTER — Other Ambulatory Visit (HOSPITAL_COMMUNITY): Payer: Self-pay

## 2023-11-13 ENCOUNTER — Ambulatory Visit (INDEPENDENT_AMBULATORY_CARE_PROVIDER_SITE_OTHER): Payer: BLUE CROSS/BLUE SHIELD | Admitting: Neurology

## 2023-11-13 VITALS — BP 138/82 | Ht 66.0 in | Wt 159.0 lb

## 2023-11-13 DIAGNOSIS — B20 Human immunodeficiency virus [HIV] disease: Secondary | ICD-10-CM

## 2023-11-13 DIAGNOSIS — E785 Hyperlipidemia, unspecified: Secondary | ICD-10-CM

## 2023-11-13 DIAGNOSIS — I10 Essential (primary) hypertension: Secondary | ICD-10-CM | POA: Diagnosis not present

## 2023-11-13 DIAGNOSIS — I639 Cerebral infarction, unspecified: Secondary | ICD-10-CM | POA: Diagnosis not present

## 2023-11-13 DIAGNOSIS — F17200 Nicotine dependence, unspecified, uncomplicated: Secondary | ICD-10-CM

## 2023-11-13 MED ORDER — CLOPIDOGREL BISULFATE 75 MG PO TABS: 75.0000 mg | ORAL_TABLET | Freq: Every day | ORAL | 1 refills | Status: DC

## 2023-11-13 NOTE — Patient Instructions (Addendum)
 Recommend 3 months of aspirin  81 mg daily and Plavix  75 mg for secondary stroke prevention, then aspirin  81 mg daily alone   Discuss cardiology consultation for Echo EF, evaluation of A1C  I placed a referral to cardiology  Recommend smoking, substance, ETOH cessation   Continue outpatient therapy, recommend healthy eating and exercise  Follow up with your primary care doctor next week  Strict management of vascular risk factors with a goal BP less than 130/90, A1c less than 7.0, LDL less than 70 for secondary stroke prevention

## 2023-11-13 NOTE — Progress Notes (Addendum)
 Patient: Bruce Yang Date of Birth: [birth year]  Reason for Visit: Stroke Clinic Follow Up History from: Patient, girlfriend [name]  Primary Neurologist: Dr.Sethi   ASSESSMENT AND PLAN 53 y.o. year old male with acute right thalamic infarct and subacute/chronic left BG/CR infarct.  Secondary to small vessel disease and multiple uncontrolled risk factors.  Right intracranial ICA stenosis by CTA.  Vascular risk factors: HTN, HLD, HIV positive, tobacco abuse, alcohol abuse, substance abuse.  Continues with left facial, left arm paresthesia.  -Recommend 3 months aspirin  81 mg daily and Plavix  75 mg daily for secondary stroke prevention, then aspirin  81 mg daily alone -Referral to cardiology for EF 40 to 45%, cardiac murmur, mild aortic valve regurgitation, mild aortic valve stenosis -Recommend continue close follow-up with primary care for aggressive risk factor modification Strict management of vascular risk factors with a goal BP less than 130/90, A1c less than 7.0, LDL less than 70 for secondary stroke prevention. May consider recheck A1C (didn't have recent hospitalized) -Recommend alcohol, smoking, substance use cessation -Recommend continue outpatient OT, PT, ST -Recommend exercise, healthy eating -Follow-up with me in 6 months or sooner if needed  Meds ordered this encounter  Medications   clopidogrel  (PLAVIX ) 75 MG tablet    Sig: Take 1 tablet (75 mg total) by mouth daily.    Dispense:  30 tablet    Refill:  1   HISTORY OF PRESENT ILLNESS: Today 11/13/23 Here today with girlfriend and cousin, Adriana. Remains on aspirin  and plavix  for 3 months. Reports almost out of Plavix . Has not had any follow up with cardiology for EF 40-45%. Was told heart murmur. Remains on Lipitor  80 mg daily. Has stopped marjuana, trying to cut back on cigarettes, down to 5 cigarettes a day. Has stopped ETOH mostly, single beer 1-2 times a week, has completely stopped hard liquor. Today BP 138/82,  running 140's at home. Remains on HIV medication, this was a new diagnosis. Reports the numbness to the left side has resolved, has some intermittent pain to the left 4th finger after bumping this week that causes rain to radiate to left arm. He is not currently working. Went to get fitted for foot arches. Remains in outpatient PT, OT, ST. He is not a morning person.   HISTORY  Presented to the ER 10/13/2023 for left-sided numbness.  Acute right thalamic infarct and subacute/chronic left BG/CR infarct.  Secondary to small vessel disease with multiple uncontrolled risk factors.  -CT head right thalamic infarct, old BG infarct -MRI of the brain acute right thalamic infarct and subacute/chronic left BG/CR infarct. Dr. Jerri reviewed felt left midbrain and pontine wallerian degeneration present, indicating left BG/CR infarct likely to be more chronic. -CTA head and neck-severe stenosis at the petrous segment of the right ICA and moderate at the paraclinoid segment.  Diffuse decrease in caliber of the cervical right ICA related to upstream stenosis.  Focal moderate stenosis at the right P1-P2 junction and mild stenosis at proximal P2 stenosis of the left PCA. Dr. Jerri reviewed, felt right ICA petrous segment moderate stenosis at most -2D echo EF 40 to 45% -LDL 208 started Lipitor  80 -A1c 6.3 in November 2023 -No antithrombotic prior to admission, 3 months of DAPT aspirin  81 and Plavix  75 given right ICA petrous/siphon segment stenosis, then aspirin  alone -UDS positive for amphetamines and THC -Found to be HIV positive  REVIEW OF SYSTEMS: Out of a complete 14 system review of symptoms, the patient complains only of the following symptoms, and all  other reviewed systems are negative.  See HPI  ALLERGIES: Allergies  Allergen Reactions   Antihistamines, Chlorpheniramine-Type     Affects Prostate     HOME MEDICATIONS: Outpatient Medications Prior to Visit  Medication Sig Dispense Refill   acetaminophen   (TYLENOL ) 500 MG tablet Take 1-2 tablets (500-1,000 mg total) by mouth every 6 (six) hours as needed for mild pain (pain score 1-3) or moderate pain (pain score 4-6).     amLODipine -benazepril  (LOTREL ) 5-10 MG capsule Take 1 capsule by mouth daily. 30 capsule 2   aspirin  EC 81 MG tablet Take 1 tablet (81 mg total) by mouth daily. Swallow whole. 90 tablet 0   atorvastatin  (LIPITOR ) 80 MG tablet Take 1 tablet (80 mg total) by mouth daily. 90 tablet 0   clopidogrel  (PLAVIX ) 75 MG tablet Take 1 tablet (75 mg total) by mouth daily. 88 tablet 0   dolutegravir -lamiVUDine  (DOVATO ) 50-300 MG tablet Take 1 tablet by mouth daily. 30 tablet 11   folic acid  (FOLVITE ) 1 MG tablet Take 1 tablet (1 mg total) by mouth daily. 30 tablet 1   Multiple Vitamin (MULTIVITAMIN WITH MINERALS) TABS tablet Take 1 tablet by mouth daily.     thiamine  (VITAMIN B1) 100 MG tablet Take 1 tablet (100 mg total) by mouth daily. 30 tablet 1   buPROPion  (WELLBUTRIN  SR) 150 MG 12 hr tablet Take 1 tablet (150 mg total) by mouth daily for 3 days, THEN 1 tablet (150 mg total) 2 (two) times daily. (Patient not taking: Reported on 11/13/2023) 60 tablet 2   pregabalin  (LYRICA ) 75 MG capsule Take 1 capsule (75 mg total) by mouth 2 (two) times daily. (Patient not taking: Reported on 11/13/2023) 60 capsule 0   tadalafil  (CIALIS ) 10 MG tablet Take 1 tablet (10 mg total) by mouth daily as needed for erectile dysfunction. (Patient not taking: Reported on 11/13/2023) 10 tablet 3   No facility-administered medications prior to visit.    PAST MEDICAL HISTORY: Past Medical History:  Diagnosis Date   Hyperlipidemia    Hypertension    Recurrent boils of anus    [facility]roke [facility])     PAST SURGICAL HISTORY: No past surgical history on file.  FAMILY HISTORY: Family History  Problem Relation Age of Onset   Other Mother        Homicide   Hypertension Maternal Aunt    Diabetes Maternal Aunt    Cancer Maternal Uncle        Multiple myeloma   Liver  disease Neg Hx    Esophageal cancer Neg Hx    Colon cancer Neg Hx     SOCIAL HISTORY: Social History   Socioeconomic History   Marital status: Single    Spouse name: Not on file   Number of children: 8   Years of education: Not on file   Highest education level: Associate degree: occupational, scientist, product/process development, or vocational program  Occupational History   Occupation: Psychologist, Occupational  Tobacco Use   Smoking status: Every Day    Current packs/day: 1.00    Types: Cigarettes   Smokeless tobacco: Never  Vaping Use   Vaping status: Never Used  Substance and Sexual Activity   Alcohol use: Not Currently    Alcohol/week: 2.0 standard drinks of alcohol    Types: 2 Shots of liquor per week   Drug use: Not Currently    Frequency: 5.0 times per week    Types: Marijuana   Sexual activity: Yes    Partners: Female  Other Topics Concern  Not on file  Social History Narrative   Left handed   Caffeine- 1 cup   Not currently working    Social Drivers of Health   Financial Resource Strain: Not on file  Food Insecurity: No Food Insecurity (10/14/2023)   Hunger Vital Sign    Worried About Running Out of Food in the Last Year: Never true    Ran Out of Food in the Last Year: Never true  Transportation Needs: Patient Declined (10/14/2023)   PRAPARE - Administrator, Civil Service (Medical): Patient declined    Lack of Transportation (Non-Medical): Patient declined  Physical Activity: Not on file  Stress: Not on file  Social Connections: Not on file  Intimate Partner Violence: Unknown (10/14/2023)   Humiliation, Afraid, Rape, and Kick questionnaire    Fear of Current or Ex-Partner: Patient declined    Emotionally Abused: Patient declined    Physically Abused: Not on file    Sexually Abused: Patient declined   PHYSICAL EXAM  Vitals:   11/13/23 0824  BP: 138/82  Weight: 159 lb (72.1 kg)  Height: 5' 6 (1.676 m)   Body mass index is 25.66 kg/m.  Generalized: Well developed, in no acute  distress, disheveled Neurological examination  Mentation: Alert oriented to time, place, history taking. Follows all commands, speech is very mildly dysarthric   Most history is provided by his significant other. Cranial nerve II-XII: Pupils were equal round reactive to light. Extraocular movements were full, visual field were full on confrontational test. Facial sensation and strength were normal.  Head turning and shoulder shrug  were normal and symmetric. Motor: The motor testing reveals 5 over 5 strength of all 4 extremities. Good symmetric motor tone is noted throughout.  I did not notice any standout weakness to either the left or the right side. Reports pain to left 4th finger, no swelling noted.  Sensory: Decreased soft touch sensation to the left face, left arm Coordination: Cerebellar testing reveals good finger-nose-finger and heel-to-shin bilaterally.  Gait and station: Gait is normal, but antalgic Reflexes: Deep tendon reflexes are symmetric and normal bilaterally.   DIAGNOSTIC DATA (LABS, IMAGING, TESTING) - I reviewed patient records, labs, notes, testing and imaging myself where available.  Lab Results  Component Value Date   WBC 3.9 (L) 10/15/2023   HGB 12.4 (L) 10/15/2023   HCT 36.2 (L) 10/15/2023   MCV 88.9 10/15/2023   PLT 258 10/15/2023      Component Value Date/Time   NA 133 (L) 10/15/2023 0531   K 4.3 10/15/2023 0531   CL 97 (L) 10/15/2023 0531   CO2 24 10/15/2023 0531   GLUCOSE 230 (H) 10/15/2023 0531   BUN 16 10/15/2023 0531   CREATININE 0.94 10/15/2023 0531   CALCIUM  9.8 10/15/2023 0531   PROT 8.1 10/13/2023 1625   ALBUMIN 3.8 10/13/2023 1625   AST 23 10/13/2023 1625   ALT 17 10/13/2023 1625   ALKPHOS 81 10/13/2023 1625   BILITOT 0.5 10/13/2023 1625   GFRNONAA >60 10/15/2023 0531   GFRAA >60 05/23/2020 1009   Lab Results  Component Value Date   CHOL 290 (H) 10/14/2023   HDL 62 10/14/2023   LDLCALC 208 (H) 10/14/2023   TRIG 102 10/14/2023    CHOLHDL 4.7 10/14/2023   Lab Results  Component Value Date   HGBA1C 6.3 08/19/2023   Lab Results  Component Value Date   VITAMINB12 374 08/19/2023   Lab Results  Component Value Date   TSH 0.88 08/19/2023  Lauraine Born, AGNP-C, DNP 11/13/2023, 8:39 AM [facility] [address] [place], [place] [address] [phone]

## 2023-11-14 ENCOUNTER — Other Ambulatory Visit: Payer: Self-pay | Admitting: Family Medicine

## 2023-11-14 ENCOUNTER — Ambulatory Visit: Payer: Medicaid Other

## 2023-11-14 ENCOUNTER — Ambulatory Visit: Payer: Medicaid Other | Admitting: Occupational Therapy

## 2023-11-14 DIAGNOSIS — I10 Essential (primary) hypertension: Secondary | ICD-10-CM

## 2023-11-18 ENCOUNTER — Ambulatory Visit: Payer: Medicaid Other | Attending: Internal Medicine | Admitting: Speech Pathology

## 2023-11-18 ENCOUNTER — Encounter: Payer: Self-pay | Admitting: Speech Pathology

## 2023-11-18 DIAGNOSIS — M79602 Pain in left arm: Secondary | ICD-10-CM | POA: Diagnosis present

## 2023-11-18 DIAGNOSIS — R41841 Cognitive communication deficit: Secondary | ICD-10-CM | POA: Insufficient documentation

## 2023-11-18 DIAGNOSIS — R278 Other lack of coordination: Secondary | ICD-10-CM | POA: Diagnosis present

## 2023-11-18 DIAGNOSIS — M6281 Muscle weakness (generalized): Secondary | ICD-10-CM | POA: Insufficient documentation

## 2023-11-18 DIAGNOSIS — R260 Ataxic gait: Secondary | ICD-10-CM | POA: Insufficient documentation

## 2023-11-18 DIAGNOSIS — I6389 Other cerebral infarction: Secondary | ICD-10-CM | POA: Insufficient documentation

## 2023-11-18 DIAGNOSIS — R2689 Other abnormalities of gait and mobility: Secondary | ICD-10-CM | POA: Diagnosis present

## 2023-11-18 DIAGNOSIS — R41840 Attention and concentration deficit: Secondary | ICD-10-CM | POA: Diagnosis present

## 2023-11-18 DIAGNOSIS — R41844 Frontal lobe and executive function deficit: Secondary | ICD-10-CM | POA: Diagnosis present

## 2023-11-18 NOTE — Therapy (Deleted)
 OUTPATIENT OCCUPATIONAL THERAPY NEURO EVALUATION  Patient Name: Bruce Yang MRN: [id number] DOB:[birth year], 53 y.o., male Today's Date: 11/18/2023  PCP: Dr. Elsie Saas PROVIDER: Dr.Ghmire  END OF SESSION:    Past Medical History:  Diagnosis Date   Hyperlipidemia    Hypertension    Recurrent boils of anus    Stroke [facility])    No past surgical history on file. Patient Active Problem List   Diagnosis Date Noted   Essential hypertension 10/21/2023   Erectile dysfunction 10/21/2023   Aortic atherosclerosis (HCC) 10/21/2023   Carotid artery disease (HCC) 10/21/2023   G6PD deficiency 10/17/2023   HIV disease (HCC) 10/15/2023   Therapeutic drug monitoring 10/15/2023   Alcohol use 10/14/2023   Substance abuse (HCC) 10/14/2023   Cardiac murmur 10/14/2023   Methamphetamine use (HCC) 10/14/2023   Acute CVA (cerebrovascular accident) (HCC) 10/13/2023   Spondylolisthesis, lumbosacral region 09/25/2023   Prediabetes 08/20/2023   Hyperlipidemia 08/20/2023   Hyperglycemia 08/19/2023   Chronic low back pain without sciatica 08/19/2023   Peripheral neuropathy 08/19/2023   Tobacco use disorder 08/19/2023   Marijuana use, continuous 08/19/2023    ONSET DATE: 10/13/23  REFERRING DIAG: 63.9 (ICD-10-CM) - Cerebrovascular accident (CVA), unspecified mechanism (HCC)   THERAPY DIAG:  No diagnosis found.  Rationale for Evaluation and Treatment: Rehabilitation  SUBJECTIVE:   SUBJECTIVE STATEMENT: Pt reports he wants to get stronger Pt accompanied by: significant other  PERTINENT HISTORY: 53 y.o. male with medical history significant of polysubstance abuse, chronic low back pain presented to ED with complaint of numbness of his left face/arm/leg X 3 days.  In the ED, vital signs fairly stable.  Labs showed ethanol level 164, UDS positive for amphetamines and THC.  CT head showing new low density within the anterior right thalamus consistent with an infarction, exact age  indeterminate. Brain MRI showing a 1 cm acute ischemic nonhemorrhagic right thalamic infarct and a 2 cm acute to early subacute ischemic nonhemorrhagic left basal ganglia infarct.   First stroke was March 31st, second one was 10/13/23  PMH: newly disagnosed HIV, hypertension, hyperlipidemia  DIAGNOSTIC FINDINGS:  CT HEAD AND NECK 1. Areas of acute/subacute infarcts are better demonstrated on recent MRI. 2. Remote infarcts in the thalami and left basal ganglia. 3. Severe stenosis at the petrous segment of the right ICA and moderate at the paraclinoid segment. 4. Diffuse decrease in caliber of the cervical right ICA related to upstream stenosis. 5. Focal moderate stenosis at the right P1-P2 junction and mild stenosis at the proximal P2 segment of the left PCA. 6. Aortic atherosclerosis.     MRI C-SPINE 1. Motion degraded exam. 2. No definite acute abnormality within the cervical spine or spinal cord. 3. Central disc protrusion at C3-4 with resultant mild spinal stenosis. 4. Multifactorial degenerative changes with resultant multilevel foraminal narrowing as above. Notable findings include mild to moderate left C5 foraminal stenosis, moderate to severe right with mild left C6 foraminal narrowing, with moderate right C7 foraminal stenosis.   BRAIN MRI 1. 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 2. 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 3. 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    PRECAUTIONS: Fall and Other: HIV - newly diagnosed  WEIGHT BEARING RESTRICTIONS: No  PAIN:  Are you having pain? Yes: NPRS scale: 5/10 Pain location: left leg Pain description: aching Aggravating factors: ongoing Relieving factors: unknown No arm pain at time of eval however pt  reports moderate pain at times in UE's  FALLS: Has patient fallen in last 6 months? No  LIVING  ENVIRONMENT: Lives with: lives with their family and lives with an adult companion Lives in: House/apartment Stairs: Yes: External:    PLOF: Independent  PATIENT GOALS: get stronger  OBJECTIVE:  Note: Objective measures were completed at Evaluation unless otherwise noted.  HAND DOMINANCE: Left  ADLs: Overall ADLs: requires assistance Transfers/ambulation related to ADLs: Eating: mod I  Grooming: mod I UB Dressing: min A LB Dressing: minA Toileting: mod I / supervision with RW  Bathing:mod  A Tub Shower transfers: min A Equipment: none  IADLs: Pt has assistance with all IADLs  Light housekeeping: dependent Meal Prep: dependent   MOBILITY STATUS:  mod I to supervision    FUNCTIONAL OUTCOME MEASURES: Quick Dash: 77% disability  UPPER EXTREMITY ROM:    Active ROM Right eval Left eval  Shoulder flexion 130 110  Shoulder abduction  115  Shoulder adduction    Shoulder extension    Shoulder internal rotation    Shoulder external rotation    Elbow flexion    Elbow extension -20 -30  Wrist flexion    Wrist extension    Wrist ulnar deviation    Wrist radial deviation    Wrist pronation    Wrist supination    (Blank rows = not tested)  Grip strength: Right: 40 lbs; Left: 60 lbs  COORDINATION: 9 Hole Peg test: Right: 1 min 17 secs sec; Left: 42.87 sec  SENSATION: Light touch: Impaired for LUE    COGNITION: Overall cognitive status: Impaired, ot be further assessed in a functional context, pt has decreased safety awareness.    VISION ASSESSMENT: Not tested    OBSERVATIONS: Pleasant gentleman accompanied by his GF                                                                                                                             TREATMENT DATE: 11/03/23 eval         PATIENT EDUCATION: Education details: role of OT, potential goals. Person educated: Patient and girlfriend Education method: Explanation Education comprehension:  verbalized understanding  HOME EXERCISE PROGRAM: n/a   GOALS: Goals reviewed with patient? Yes  SHORT TERM GOALS: Target date: 12/04/23  I  with inital HEP Baseline:dependent Goal status: INITIAL  2.  Pt will perform bathing with min A Baseline: mod A Goal status: INITIAL  3.  Pt will perfrom dressing with supervision Baseline: min A Goal status: INITIAL  4. Pt will demonstrate improved LUE coordination as eveidenced by decreasing 9 hole peg test score to 39 secs or less Baseline:LUE  42.87 Goal status: INITIAL  5.  Pt will demonstrate improved RUE coordination as eveidenced by decreasing 9 hole peg test score to 70 secs or less Baseline: RUE 1 min 17 secs Goal status: INITIAL  6.  I with sensory precautions for LUE. Baseline: dependent Goal status: INITIAL 7.Pt will retieve  a lightweight object at 115 shoulder flexion and -25 elbow extension with LUE.  Baseline: shoulder flexion 110, elbow ext -30  LONG TERM GOALS: Target date: 01/19/24  I with updated HEP Baseline: dependent Goal status: INITIAL  2.  Pt will demonstrate improved RUE coordination as eveidenced by decreasing 9 hole peg test score to 65 secs or less Baseline: RUE 1 min 17 secs Goal status: INITIAL  3.  Pt will increase RUE grip strength to 45 lbs or greater for increased RUE functional use. Baseline:  grip strength RUE 40 lbs, LUE 60 lbs Goal status: INITIAL  4.  Pt will perform all basic ADLS with supervision Baseline: min-mod A Goal status: INITIAL  5.  Pt will perform light home management with supervision. Baseline: dependent Goal status: INITIAL  6.  Pt will improve Quick Dash core to 72% or better disability Baseline: 77% Goal status: INITIAL  ASSESSMENT:  CLINICAL IMPRESSION: Patient is a 53 y.o. male who was seen today for occupational therapy evaluation for CVA. Pt. with medical history significant of polysubstance abuse, chronic low back pain presented to ED with complaint of  numbness of his left face/arm/leg X 3 days.  In the ED, vital signs fairly stable.  Labs showed ethanol level 164, UDS positive for amphetamines and THC.  CT head showing new low density within the anterior right thalamus consistent with an infarction, exact age indeterminate. Brain MRI showing a 1 cm acute ischemic nonhemorrhagic right thalamic infarct and a 2 cm acute to early subacute ischemic nonhemorrhagic left basal ganglia infarct.   First stroke was March 31st, second one was 10/13/23  PMH: newly disagnosed HIV, hypertension, hyperlipidemia Pt presents with the following deficits: decreased strength, decreased coordination, decreased balance, cognitve deficits, pain which impedes perfromance of ADLS/IADLs. Pt canbenefit from skilled occupational therapy to address these deficits in order to maximize pt's safety and I with daily activities.  PERFORMANCE DEFICITS: in functional skills including ADLs, IADLs, coordination, dexterity, proprioception, sensation, tone, ROM, strength, pain, flexibility, Fine motor control, Gross motor control, mobility, balance, endurance, decreased knowledge of precautions, decreased knowledge of use of DME, and UE functional use, cognitive skills including attention, problem solving, safety awareness, thought, and understand, and psychosocial skills including coping strategies, environmental adaptation, habits, interpersonal interactions, and routines and behaviors.   IMPAIRMENTS: are limiting patient from ADLs, IADLs, rest and sleep, work, play, leisure, and social participation.   CO-MORBIDITIES: may have co-morbidities  that affects occupational performance. Patient will benefit from skilled OT to address above impairments and improve overall function.  MODIFICATION OR ASSISTANCE TO COMPLETE EVALUATION: No modification of tasks or assist necessary to complete an evaluation.  OT OCCUPATIONAL PROFILE AND HISTORY: Detailed assessment: Review of records and additional  review of physical, cognitive, psychosocial history related to current functional performance.  CLINICAL DECISION MAKING: LOW - limited treatment options, no task modification necessary  REHAB POTENTIAL: Good  EVALUATION COMPLEXITY: Low    PLAN:  OT FREQUENCY: 1x/week plus eval  OT DURATION:11 weeks  PLANNED INTERVENTIONS: 97168 OT Re-evaluation, 97535 self care/ADL training, 81191 therapeutic exercise, 97530 therapeutic activity, 97112 neuromuscular re-education, 97140 manual therapy, 97035 ultrasound, 97018 paraffin, 47829 moist heat, 97010 cryotherapy, 97034 contrast bath, 97129 Cognitive training (first 15 min), 56213 Cognitive training(each additional 15 min), 08657 Orthotics management and training, 84696 Splinting (initial encounter), passive range of motion, balance training, functional mobility training, visual/perceptual remediation/compensation, energy conservation, coping strategies training, patient/family education, and DME and/or AE instructions  RECOMMENDED OTHER SERVICES: PT, ST  CONSULTED  AND AGREED WITH PLAN OF CARE: Patient and family member/caregiver  PLAN FOR NEXT SESSION: inital HEP for coordination, closed chain ROM   Kesha Hurrell, OT 11/18/2023, 4:11 PM

## 2023-11-18 NOTE — Therapy (Unsigned)
.. OUTPATIENT SPEECH LANGUAGE PATHOLOGY  TREATMENT   Patient Name: Bruce Yang MRN: [id number] DOB:09/24/1971, 53 y.o., male Today's Date: 11/18/2023  PCP: Loyola Mast MD REFERRING PROVIDER: Dorcas Carrow, MD  END OF SESSION:  End of Session - 11/18/23 0852     Visit Number 2    Number of Visits 12    Date for SLP Re-Evaluation 12/02/23    SLP Start Time 0847    SLP Stop Time  0930    SLP Time Calculation (min) 43 min    Activity Tolerance Patient tolerated treatment well             Past Medical History:  Diagnosis Date   Hyperlipidemia    Hypertension    Recurrent boils of anus    Stroke [facility])    History reviewed. No pertinent surgical history. Patient Active Problem List   Diagnosis Date Noted   Essential hypertension 10/21/2023   Erectile dysfunction 10/21/2023   Aortic atherosclerosis (HCC) 10/21/2023   Carotid artery disease (HCC) 10/21/2023   G6PD deficiency 10/17/2023   HIV disease (HCC) 10/15/2023   Therapeutic drug monitoring 10/15/2023   Alcohol use 10/14/2023   Substance abuse (HCC) 10/14/2023   Cardiac murmur 10/14/2023   Methamphetamine use (HCC) 10/14/2023   Acute CVA (cerebrovascular accident) (HCC) 10/13/2023   Spondylolisthesis, lumbosacral region 09/25/2023   Prediabetes 08/20/2023   Hyperlipidemia 08/20/2023   Hyperglycemia 08/19/2023   Chronic low back pain without sciatica 08/19/2023   Peripheral neuropathy 08/19/2023   Tobacco use disorder 08/19/2023   Marijuana use, continuous 08/19/2023    ONSET DATE: 10/13/23   REFERRING DIAG: I63.9 (ICD-10-CM) - Cerebrovascular accident (CVA), unspecified mechanism (HCC)   THERAPY DIAG:  Cognitive communication deficit  Rationale for Evaluation and Treatment: Rehabilitation  SUBJECTIVE:   SUBJECTIVE STATEMENT: Pt was pleasant and cooperative throughout assessment. Pt accompanied by: significant other; Rosita Fire  PERTINENT HISTORY: Chronic back pain, hyperglycemia, marijuana  use  PAIN:  Are you having pain? Yes: NPRS scale: 6 Pain location: L arm, L leg Pain description: burning pain Aggravating factors: N/A Relieving factors: N/A  FALLS: Has patient fallen in last 6 months?  Yes; with CVA; no other falls  LIVING ENVIRONMENT: Lives with: lives with their partner Lives in: House/apartment  PLOF:  Level of assistance: Comment: R side weakness from suspected stroke in April 2024. Otherwise, Independent.  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  DIAGNOSTIC FINDINGS: Per chart review  MR BRAIN WO CONTRAST   10/13/2023  IMPRESSION: 1. 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 2. 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 3. 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     Electronically Signed   By: Rise Mu M.D.   On: 10/13/2023 19:54  COGNITION: Overall cognitive status: Impaired Areas of impairment:  Attention: Impaired: Sustained, Selective, Alternating, Divided Memory: Impaired: Working Teacher, music term Scientist, research (physical sciences) function: Impaired: Impulse control, Problem solving, Organization, Planning, Error awareness, Self-correction, and Slow processing Functional deficits: See below  COGNITIVE COMMUNICATION: Following directions: Follows multi-step commands inconsistently  Auditory comprehension: Impaired: requires repetition; suspect attention  Verbal expression: WFL Functional communication: Impaired: Girlfriend has not noted any significant changes other than attention; though he had difficulty in baseline. This impacts following directions and recall of conversations.  ORAL MOTOR EXAMINATION: Overall status:  WFL Comments: Girlfriend reports  some slurred speech; pt feels like speech is normal.  STANDARDIZED ASSESSMENTS:  .Marland Kitchen  Cognitive Linguistic Quick Test: AGE - 18 - 69   The Cognitive Linguistic Quick Test (CLQT) was administered to assess the relative status of five cognitive domains: attention, memory, language, executive functioning, and visuospatial skills. Scores from 10 tasks were used to estimate severity ratings (standardized for age groups 18-69 years and 70-89 years) for each domain, a clock drawing task, as well as an overall composite severity rating of cognition.       Task Score Criterion Cut Scores  Personal Facts 8/8 8  Symbol Cancellation 11/12 11  Confrontation Naming 10/10 10  Clock Drawing  10/13 12  Story Retelling 5/10 6  Symbol Trails 5/10 9  Generative Naming 5/9 5  Design Memory 3/6 5  Mazes  6/8 7  Design Generation 6/13 6    Cognitive Domain Composite Score Severity Rating  Attention 160/215 Mild  Memory 121/185 Moderate  Executive Function 22/40 Mild  Language 28/37 Mild  Visuospatial Skills 68/105 Mild  Clock Drawing  10/13 Mild  Composite Severity Rating  Mild           PATIENT REPORTED OUTCOME MEASURES (PROM): Not completed                                                                                                                            TREATMENT DATE:   11/18/23: Good days and Bad days "evil" - becoming easily agitated. Doesn't like to be opposed - wants to be the boss. Quick to anger.     PATIENT EDUCATION: Education details: SLP role; results Person educated: Patient and partner Education method: Explanation Education comprehension: verbalized understanding and needs further education   GOALS: Goals reviewed with patient? Yes  SHORT TERM GOALS: Target date: 11/11/23   Pt will verbalize 3 strategies to increase active listening skills for recall of important information with minA verbal cues. Baseline:  Goal status: INITIAL   Pt will verbalize 3  strategies to support memory with minA verbal cues.  Baseline:  Goal status:  INITIAL    Patient will verbalize 3 strategies to assist with organization of tasks and thoughts with minA verbal cues. Baseline:  Goal status: INITIAL   LONG TERM GOALS: Target date: 12/09/22   1.Pt will report success in using active listening skills to recall important information/details in home/ community. Baseline:  Goal status:  INTIAL    2.Pt will report successful use of strategies to support memory in daily activities. Baseline:  Goal status: INITIAL   3.Patient will report successful use of strategies to support task and thought organization in home/community. Baseline:  Goal status: INITIAL  ASSESSMENT:  CLINICAL IMPRESSION: Pt is a 53 yo male who presents to ST OP for evaluation post CVA. Pt endorses cognitive changes, specifically attention. Pt feels that he is 80% back to his baseline. Girlfriend reports pt has improved and is "more  like himself" than he was in the hospital. Pt was assessed using CLQT - see above for scores. SLP observed deficits in attention and memory. Pt was also noted to be impulsive, not waiting for therapist to finish directions before beginning task.Pt also had difficulty recalling directions of subtasks, but did not ask for help. SLP also noted decreased error awareness and problem solving to correct errors. SLP rec skilled ST services to address cognitive-communication impairment to maximize functional independence.      OBJECTIVE IMPAIRMENTS: include attention, memory, awareness, and executive functioning. These impairments are limiting patient from managing medications, managing appointments, managing finances, household responsibilities, ADLs/IADLs, and effectively communicating at home and in community. Factors affecting potential to achieve goals and functional outcome are  na .Marland Kitchen Patient will benefit from skilled SLP services to address above impairments and improve  overall function.  REHAB POTENTIAL: Good  PLAN:  SLP FREQUENCY: 1-2x/week  SLP DURATION: 6 weeks  PLANNED INTERVENTIONS: Environmental controls, Cueing hierachy, Internal/external aids, Functional tasks, SLP instruction and feedback, Compensatory strategies, Patient/family education, and 16109 Treatment of speech (30 or 45 min)     [employer], CCC-SLP 11/18/2023, 8:54 AM

## 2023-11-19 ENCOUNTER — Ambulatory Visit: Payer: Medicaid Other | Admitting: Occupational Therapy

## 2023-11-19 ENCOUNTER — Ambulatory Visit: Payer: Medicaid Other

## 2023-11-19 ENCOUNTER — Encounter: Payer: Self-pay | Admitting: Speech Pathology

## 2023-11-19 ENCOUNTER — Encounter: Payer: Self-pay | Admitting: Family Medicine

## 2023-11-19 ENCOUNTER — Ambulatory Visit (INDEPENDENT_AMBULATORY_CARE_PROVIDER_SITE_OTHER): Payer: BLUE CROSS/BLUE SHIELD | Admitting: Family Medicine

## 2023-11-19 VITALS — BP 132/74 | HR 72 | Temp 98.4°F | Ht 66.0 in | Wt 160.4 lb

## 2023-11-19 DIAGNOSIS — N529 Male erectile dysfunction, unspecified: Secondary | ICD-10-CM | POA: Diagnosis not present

## 2023-11-19 DIAGNOSIS — Z7189 Other specified counseling: Secondary | ICD-10-CM

## 2023-11-19 DIAGNOSIS — S6992XA Unspecified injury of left wrist, hand and finger(s), initial encounter: Secondary | ICD-10-CM | POA: Diagnosis not present

## 2023-11-19 DIAGNOSIS — Z8673 Personal history of transient ischemic attack (TIA), and cerebral infarction without residual deficits: Secondary | ICD-10-CM

## 2023-11-19 DIAGNOSIS — I10 Essential (primary) hypertension: Secondary | ICD-10-CM | POA: Diagnosis not present

## 2023-11-19 DIAGNOSIS — H5712 Ocular pain, left eye: Secondary | ICD-10-CM

## 2023-11-19 DIAGNOSIS — E785 Hyperlipidemia, unspecified: Secondary | ICD-10-CM

## 2023-11-19 DIAGNOSIS — F17200 Nicotine dependence, unspecified, uncomplicated: Secondary | ICD-10-CM | POA: Diagnosis not present

## 2023-11-19 MED ORDER — TADALAFIL 10 MG PO TABS
10.0000 mg | ORAL_TABLET | Freq: Every day | ORAL | 3 refills | Status: DC | PRN
Start: 2023-11-19 — End: 2024-07-19

## 2023-11-19 MED ORDER — VARENICLINE TARTRATE 1 MG PO TABS: 1.0000 mg | ORAL_TABLET | Freq: Two times a day (BID) | ORAL | 5 refills | Status: DC

## 2023-11-19 MED ORDER — AMLODIPINE BESY-BENAZEPRIL HCL 5-20 MG PO CAPS
1.0000 | ORAL_CAPSULE | Freq: Every day | ORAL | 3 refills | Status: DC
Start: 2023-11-19 — End: 2024-02-12

## 2023-11-19 MED ORDER — VARENICLINE TARTRATE 0.5 MG PO TABS: ORAL_TABLET | ORAL | 0 refills | Status: AC

## 2023-11-19 NOTE — Progress Notes (Signed)
[facility] [address] [place] [address] Dept: [phone] Dept Fax: [phone]  Chronic Care Office Visit  Subjective:    Patient ID: Bruce Yang, male    DOB: 04-16-1971, 54 y.o..   MRN: [id number]  Chief Complaint  Patient presents with   Hypertension    3 month f/u.  C/o having LT ringer finger pain x 1 week.  LT eye.    History of Present Illness:  Patient is in today for reassessment of chronic medical issues.  Patient had an acute right CVA on Jan 16 with numbness to his left face, arm, and leg.  He has persistent numbness to his left arm. He is using a walker to assist with ambulation. He recently saw Ms. Christia Reading (neurology NP) who recommends he continue aspirin and clopidogrel for 3 months. Mr. Boehle and his wife have been discussing advanced directives. He wants to consider CPR, but does not want prolonged ventilator support if there is no reasonable chance of recovery.   Mr. Muscatello has cut his smoking to 1/2-1/3 ppd. I had tried him on bupropion, but he found this made him excessively drowsy. He is willing to try a different medicine.   Mr. Heskett  has hypertension. He is managed on amlodipine-benazepril (Lotrel) 5-10 mg daily.  Mr. Staat has hyperlipidemia. He is managed on atorvastatin 80 mg, started in Jan.  Mr. Holst notes he jammed his left 4th finger, in part due to his numbness. Despite this, he has felt some pain.  Mr. Xu notes an ongoing left eye irritation since the time of his stroke.   Past Medical History: Patient Active Problem List   Diagnosis Date Noted   History of CVA (cerebrovascular accident) 11/19/2023   Essential hypertension 10/21/2023   Erectile dysfunction 10/21/2023   Aortic atherosclerosis (HCC) 10/21/2023   Carotid artery disease (HCC) 10/21/2023   G6PD deficiency 10/17/2023   HIV disease (HCC) 10/15/2023   Therapeutic drug monitoring 10/15/2023    Alcohol use 10/14/2023   Substance abuse (HCC) 10/14/2023   Cardiac murmur 10/14/2023   Methamphetamine use (HCC) 10/14/2023   Acute CVA (cerebrovascular accident) (HCC) 10/13/2023   Spondylolisthesis, lumbosacral region 09/25/2023   Prediabetes 08/20/2023   Hyperlipidemia 08/20/2023   Hyperglycemia 08/19/2023   Chronic low back pain without sciatica 08/19/2023   Peripheral neuropathy 08/19/2023   Tobacco use disorder 08/19/2023   Marijuana use, continuous 08/19/2023   No past surgical history on file. Family History  Problem Relation Age of Onset   Other Mother        Homicide   Hypertension Maternal Aunt    Diabetes Maternal Aunt    Cancer Maternal Uncle        Multiple myeloma   Liver disease Neg Hx    Esophageal cancer Neg Hx    Colon cancer Neg Hx    Outpatient Medications Prior to Visit  Medication Sig Dispense Refill   acetaminophen (TYLENOL) 500 MG tablet Take 1-2 tablets (500-1,000 mg total) by mouth every 6 (six) hours as needed for mild pain (pain score 1-3) or moderate pain (pain score 4-6).     aspirin EC 81 MG tablet Take 1 tablet (81 mg total) by mouth daily. Swallow whole. 90 tablet 0   atorvastatin (LIPITOR) 80 MG tablet Take 1 tablet (80 mg total) by mouth daily. 90 tablet 0   clopidogrel (PLAVIX) 75 MG tablet Take 1 tablet (75 mg total) by mouth daily. 30 tablet 1   dolutegravir-lamiVUDine (DOVATO)  50-300 MG tablet Take 1 tablet by mouth daily. 30 tablet 11   folic acid (FOLVITE) 1 MG tablet Take 1 tablet (1 mg total) by mouth daily. 30 tablet 1   Multiple Vitamin (MULTIVITAMIN WITH MINERALS) TABS tablet Take 1 tablet by mouth daily.     thiamine (VITAMIN B1) 100 MG tablet Take 1 tablet (100 mg total) by mouth daily. 30 tablet 1   amLODipine-benazepril (LOTREL) 5-10 MG capsule TAKE 1 CAPSULE BY MOUTH EVERY DAY 90 capsule 0   tadalafil (CIALIS) 10 MG tablet Take 1 tablet (10 mg total) by mouth daily as needed for erectile dysfunction. 10 tablet 3   buPROPion  (WELLBUTRIN SR) 150 MG 12 hr tablet Take 1 tablet (150 mg total) by mouth daily for 3 days, THEN 1 tablet (150 mg total) 2 (two) times daily. (Patient not taking: Reported on 11/19/2023) 60 tablet 2   pregabalin (LYRICA) 75 MG capsule Take 1 capsule (75 mg total) by mouth 2 (two) times daily. (Patient not taking: Reported on 11/19/2023) 60 capsule 0   No facility-administered medications prior to visit.   Allergies  Allergen Reactions   Antihistamines, Chlorpheniramine-Type     Affects Prostate    Objective:   Today's Vitals   11/19/23 1557  BP: 132/74  Pulse: 72  Temp: 98.4 F (36.9 C)  TempSrc: Temporal  SpO2: 98%  Weight: 160 lb 6.4 oz (72.8 kg)  Height: 5\' 6"  (1.676 m)   Body mass index is 25.89 kg/m.   General: Well developed, well nourished. No acute distress. HEENT: Normocephalic, non-traumatic.  PERRL, EOMI. Conjunctiva clear.  Extremities: Full ROM of left fingers. No sign of swelling of the 4th PIP joint.  Psych: Alert and oriented. Normal mood and affect.  Health Maintenance Due  Topic Date Due   Zoster Vaccines- Shingrix (1 of 2) Never done   Colonoscopy  Never done     Assessment & Plan:   Problem List Items Addressed This Visit       Cardiovascular and Mediastinum   Essential hypertension   Blood pressure is improved, but not quite to goal. I will switch him to amlodipine-benazepril 5-20 mg daily.      Relevant Medications   tadalafil (CIALIS) 10 MG tablet   amLODipine-benazepril (LOTREL) 5-20 MG capsule     Other   Erectile dysfunction   Mr. Keisler has had trouble affording Cialis.  I helped him review Good Rx. He can get 30 tabs for ~$8 at [facility]. I will send a prescription in for him.      Relevant Medications   tadalafil (CIALIS) 10 MG tablet   History of CVA (cerebrovascular accident) - Primary   Gradually improving function. Continue aspirin 81 mg daily and clopidogrel 75 mg daily for 3 months.      Hyperlipidemia   Continue  atorvastatin 80 mg daily. I will recheck lipids at his next visit.      Relevant Medications   tadalafil (CIALIS) 10 MG tablet   amLODipine-benazepril (LOTREL) 5-20 MG capsule   Tobacco use disorder   Continue to advocate smoking cessation. I will try him on Chantix.      Relevant Medications   varenicline (CHANTIX) 0.5 MG tablet   varenicline (CHANTIX) 1 MG tablet   Other Visit Diagnoses       Advanced directives, counseling/discussion       I will provide a sample [place] Advanced Directive for him and his wife to review and consider completing.     Jammed  interphalangeal joint of finger of left hand, initial encounter       Recommend buddy taping and expectant management.     Eye pain, left       No obvious issue. Could relate to left-sided facial weakness from stroke or his HIV disease. I recommend he see his eye doctor for a compelte eye exam.       Return in about 2 months (around 01/17/2024) for Reassessment.   Loyola Mast, MD

## 2023-11-19 NOTE — Assessment & Plan Note (Signed)
Blood pressure is improved, but not quite to goal. I will switch him to amlodipine-benazepril 5-20 mg daily.

## 2023-11-19 NOTE — Assessment & Plan Note (Signed)
Mr. Bruce Yang has had trouble affording Cialis.  I helped him review Good Rx. He can get 30 tabs for ~$8 at [facility] I will send a prescription in for him.

## 2023-11-19 NOTE — Assessment & Plan Note (Signed)
Continue atorvastatin 80 mg daily. I will recheck lipids at his next visit.

## 2023-11-19 NOTE — Assessment & Plan Note (Addendum)
Gradually improving function. Continue aspirin 81 mg daily and clopidogrel 75 mg daily for 3 months.

## 2023-11-19 NOTE — Assessment & Plan Note (Signed)
Continue to advocate smoking cessation. I will try him on Chantix.

## 2023-11-20 ENCOUNTER — Ambulatory Visit: Payer: Medicaid Other | Admitting: Physical Therapy

## 2023-11-20 ENCOUNTER — Encounter: Payer: Self-pay | Admitting: Physical Therapy

## 2023-11-20 ENCOUNTER — Other Ambulatory Visit: Payer: Self-pay

## 2023-11-20 DIAGNOSIS — R278 Other lack of coordination: Secondary | ICD-10-CM

## 2023-11-20 DIAGNOSIS — R41841 Cognitive communication deficit: Secondary | ICD-10-CM | POA: Diagnosis not present

## 2023-11-20 DIAGNOSIS — R2689 Other abnormalities of gait and mobility: Secondary | ICD-10-CM

## 2023-11-20 DIAGNOSIS — M6281 Muscle weakness (generalized): Secondary | ICD-10-CM

## 2023-11-20 NOTE — Therapy (Signed)
OUTPATIENT PHYSICAL THERAPY NEURO TREATMENT    Patient Name: Page Lancon MRN: [id number] DOB:1971/05/03, 53 y.o., male Today's Date: 11/20/2023   PCP: Herbie Drape REFERRING PROVIDER: Dorcas Carrow  END OF SESSION:  PT End of Session - 11/20/23 1017     Visit Number 2    Date for PT Re-Evaluation 12/30/23    Authorization Type BCBS    PT Start Time 1017    PT Stop Time 1051   session limited by pain   PT Time Calculation (min) 34 min    Activity Tolerance Patient limited by pain    Behavior During Therapy [facility] for tasks assessed/performed;Flat affect              Past Medical History:  Diagnosis Date   Hyperlipidemia    Hypertension    Recurrent boils of anus    Stroke [facility])    History reviewed. No pertinent surgical history. Patient Active Problem List   Diagnosis Date Noted   History of CVA (cerebrovascular accident) 11/19/2023   Essential hypertension 10/21/2023   Erectile dysfunction 10/21/2023   Aortic atherosclerosis (HCC) 10/21/2023   Carotid artery disease (HCC) 10/21/2023   G6PD deficiency 10/17/2023   HIV disease (HCC) 10/15/2023   Therapeutic drug monitoring 10/15/2023   Alcohol use 10/14/2023   Substance abuse (HCC) 10/14/2023   Cardiac murmur 10/14/2023   Methamphetamine use (HCC) 10/14/2023   Acute CVA (cerebrovascular accident) (HCC) 10/13/2023   Spondylolisthesis, lumbosacral region 09/25/2023   Prediabetes 08/20/2023   Hyperlipidemia 08/20/2023   Hyperglycemia 08/19/2023   Chronic low back pain without sciatica 08/19/2023   Peripheral neuropathy 08/19/2023   Tobacco use disorder 08/19/2023   Marijuana use, continuous 08/19/2023    ONSET DATE: 10/13/23  REFERRING DIAG:  I63.9 (ICD-10-CM) - Cerebrovascular accident (CVA), unspecified mechanism (HCC)    THERAPY DIAG:  Muscle weakness (generalized)  Other abnormalities of gait and mobility  Other lack of coordination  Rationale for Evaluation and Treatment:  Rehabilitation  SUBJECTIVE:                                                                                                                                                                                             SUBJECTIVE STATEMENT:  I didn't come to PT because my leg has been hurting too much. Didn't see the doctor. Nothing is making it better expect putting it up and not moving it.     Pt accompanied by: self  PERTINENT HISTORY: 53 y.o. male with medical history significant of polysubstance abuse, chronic low back pain presented to ED with complaint of numbness of his left face/arm/leg X 3 days.  In the ED, vital signs fairly stable.  Labs showed ethanol level 164, UDS positive for amphetamines and THC.  CT head showing new low density within the anterior right thalamus consistent with an infarction, exact age indeterminate. Brain MRI showing a 1 cm acute ischemic nonhemorrhagic right thalamic infarct and a 2 cm acute to early subacute ischemic nonhemorrhagic left basal ganglia infarct. MRI of C-spine negative for acute abnormality. Neurology recommended admission to [facility] for stroke workup. Patient passed his swallow study in the ED. [facility] called to admit.   First stroke was March 31st, second one was 10/13/23   PAIN:  Are you having pain? Yes: NPRS scale: 5/10 Pain location: L leg and L shoulder Pain description: ache, dull, constant Aggravating factors: cold wind, laying for long period of time  Relieving factors: massage, movement, stretching   PRECAUTIONS: Fall  RED FLAGS: None   WEIGHT BEARING RESTRICTIONS: No  FALLS: Has patient fallen in last 6 months? Yes. Number of falls 1  LIVING ENVIRONMENT: Lives with: lives with an adult companion Lives in: House/apartment Stairs: Yes: External: 4 steps; bilateral but cannot reach both Has following equipment at home: Walker - 2 wheeled  PLOF: Independent and Independent with basic ADLs  PATIENT GOALS: walk and  move my hands   OBJECTIVE:  Note: Objective measures were completed at Evaluation unless otherwise noted.  DIAGNOSTIC FINDINGS:  CT HEAD AND NECK 1. Areas of acute/subacute infarcts are better demonstrated on recent MRI. 2. Remote infarcts in the thalami and left basal ganglia. 3. Severe stenosis at the petrous segment of the right ICA and moderate at the paraclinoid segment. 4. Diffuse decrease in caliber of the cervical right ICA related to upstream stenosis. 5. Focal moderate stenosis at the right P1-P2 junction and mild stenosis at the proximal P2 segment of the left PCA. 6. Aortic atherosclerosis.   MRI C-SPINE 1. Motion degraded exam. 2. No definite acute abnormality within the cervical spine or spinal cord. 3. Central disc protrusion at C3-4 with resultant mild spinal stenosis. 4. Multifactorial degenerative changes with resultant multilevel foraminal narrowing as above. Notable findings include mild to moderate left C5 foraminal stenosis, moderate to severe right with mild left C6 foraminal narrowing, with moderate right C7 foraminal stenosis.  BRAIN MRI 1. 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 2. 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 3. 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  COGNITION: Overall cognitive status: Impaired   SENSATION: WFL Some tingling on the left side   COORDINATION: Decreased coordination due to muscle weakness and decreased neuromuscular conrol  MUSCLE LENGTH: Hamstrings: tight in BLE   POSTURE: rounded shoulders and forward head  LOWER EXTREMITY ROM:   grossly WFL, some limitations with R ankle DF   LOWER EXTREMITY MMT:  4/5 BLE, 4-/5 for R knee extension and knee flexion    STAIRS: Level of Assistance: CGA Stair Negotiation Technique: Step to Pattern with Single Rail on Left Number of Stairs:  4  Height of Stairs: 6"  Comments: reports he is able to get into home with 4 steps using railing on one side  GAIT: Gait pattern: step through pattern, decreased arm swing- Right, decreased step length- Right, decreased stance time- Right, decreased stride length, decreased hip/knee flexion- Right, decreased ankle dorsiflexion- Right, ataxic, trendelenburg, narrow BOS, poor foot clearance- Right, and poor foot clearance- Left Distance walked: in clinic distances  Assistive device utilized:  None Level of assistance: CGA Comments: unsteady gait, slowed cadence, decreased speed, decreased R hip and knee flexion and is not using RW   FUNCTIONAL TESTS:  5 times sit to stand: 15.82s CGA  Timed up and go (TUG): 18.09s  Berg Balance Scale: 41/56                                                                                                                               TREATMENT DATE:    11/20/23  Henreitta Leber 2x12 mod cues for good form SLRs 2x12 0# mod cues for good form L LE HS stretches 4x30 seconds Figure 4 piriforms stretches 3x30 seconds B  QL stretches 2x30 seconds forward only  Nustep L3x5 minutes all four extremites to promote reciprocal movement, help build mm endurance    10/21/23- EVAL    PATIENT EDUCATION: Education details: HEP, POC, post stroke aftercare Person educated: Patient and girlfriend Education method: Explanation, Demonstration, and Handouts Education comprehension: verbalized understanding  HOME EXERCISE PROGRAM: Access Code: [email or web address] URL: [email or web address] Date: 10/21/2023 Prepared by: Cassie Freer  Exercises - Sit to Stand  - 1 x daily - 7 x weekly - 2 sets - 10 reps - Heel Raises with Counter Support  - 1 x daily - 7 x weekly - 2 sets - 10 reps - Standing Hip Abduction with Counter Support  - 1 x daily - 7 x weekly - 2 sets - 10 reps - Standing March with Counter Support  - 1 x daily - 7 x weekly - 2 sets - 10 reps - Seated  Hamstring Stretch  - 1 x daily - 7 x weekly - 2 sets - 2 reps - 15-30 hold - Seated Piriformis Stretch with Trunk Bend  - 1 x daily - 7 x weekly - 2 sets - 2 reps - 15-30 hold  GOALS: Goals reviewed with patient? Yes  SHORT TERM GOALS: Target date: 11/25/23  Patient will be independent with initial HEP. Baseline: given 10/21/23 Goal status: INITIAL  2.  Patient will demonstrate decreased fall risk by scoring < 14 sec on TUG. Baseline: 18.09s Goal status: INITIAL   LONG TERM GOALS: Target date: 12/30/23  Patient will be independent with advanced/ongoing HEP to improve outcomes and carryover.  Baseline:  Goal status: INITIAL  2.  Patient will be able to ambulate 500' with LRAD with good safety to access community.  Baseline: ataxic gait not using RW, see gait analysis above Goal status: INITIAL  3.  Patient will be able to complete 10 consecutive taps on 6" box without loss of balance   Baseline: min-modA with 4" box  Goal status: INITIAL   4.  Patient will demonstrate SLS 10s bilaterally without UE support Baseline: 3s at best  Goal status: INITIAL  5.  Patient will score 49 on Berg Balance test to demonstrate lower risk of falls. (MCID= 8 points) .  Baseline: 41 Goal status: INITIAL   ASSESSMENT:  CLINICAL IMPRESSION:    Pt arrives one month after being first evaluated for skilled PT services. Reason is a bit unclear- he reports that it was due to his leg hurting but at the same time has not received MD w/u or care for his pain. Prognosis for full recovery following his CVA is poor from PT standpoint if attendance during subacute window continues to be this intermittent/inconsistent. Took it easy today and worked on Engineering geologist, also took some time to address L LE pain. Will progress as tolerated. Ended early today per pt request due to limited endurance and increasing LE pain levels.      EVAL:Patient is a 54 y.o. male who was seen today for physical  therapy evaluation and treatment for CVA. He had a stroke last year that affected his R side and then another one on 10/13/23 which affected his L side. His R side deficits are more prominent especially with gait and his RUE. Patient was using a rolling walker in the hospital and was advised to use it upon discharge. However, he has not been compliant with it and is walking independently without an AD despite having ataxic gait pattern. He presents as a fall risk as evident from functional tests completed at evaluation. He will benefit from skilled PT to address his gait and balance abnormalities to decrease his risk for falls and normalize his gait pattern. He reports fatigue after doing functional testing today and would benefit from endurance training as well.   OBJECTIVE IMPAIRMENTS: Abnormal gait, decreased activity tolerance, decreased balance, decreased coordination, decreased endurance, difficulty walking, decreased ROM, decreased strength, improper body mechanics, and pain.   ACTIVITY LIMITATIONS: carrying, lifting, bending, squatting, stairs, transfers, and locomotion level  PARTICIPATION LIMITATIONS: cleaning, driving, shopping, community activity, and yard work  PERSONAL FACTORS: Age, Behavior pattern, Past/current experiences, Time since onset of injury/illness/exacerbation, and 3+ comorbidities: polysubstance abuse, chronic low back pain, peripheral neuropathy, hx of CVA, HIV  are also affecting patient's functional outcome.   REHAB POTENTIAL: Fair insurance visit limitation b/w all three disciplines    CLINICAL DECISION MAKING: Stable/uncomplicated  EVALUATION COMPLEXITY: Low  PLAN:  PT FREQUENCY: 2x/week  PT DURATION: 2 weeks  PLANNED INTERVENTIONS: 97110-Therapeutic exercises, 97530- Therapeutic activity, 97112- Neuromuscular re-education, 97535- Self Care, 16109- Manual therapy, [phone]- Gait training, Patient/Family education, Balance training, Stair training, Dry Needling, Joint  mobilization, Spinal mobilization, Cryotherapy, and Moist heat  PLAN FOR NEXT SESSION: work on strengthening for LE, balance training, address LE and low back pain, update HEP as appropriate and encourage PT attendance   Nedra Hai, PT, DPT 11/20/23 10:52 AM

## 2023-11-21 NOTE — Progress Notes (Signed)
I agree with the above plan

## 2023-11-25 ENCOUNTER — Other Ambulatory Visit (HOSPITAL_COMMUNITY): Payer: Self-pay

## 2023-11-26 ENCOUNTER — Other Ambulatory Visit: Payer: Self-pay

## 2023-11-26 ENCOUNTER — Ambulatory Visit: Payer: BLUE CROSS/BLUE SHIELD | Admitting: Infectious Disease

## 2023-11-26 DIAGNOSIS — I639 Cerebral infarction, unspecified: Secondary | ICD-10-CM

## 2023-11-26 DIAGNOSIS — D75A Glucose-6-phosphate dehydrogenase (G6PD) deficiency without anemia: Secondary | ICD-10-CM

## 2023-11-26 DIAGNOSIS — R011 Cardiac murmur, unspecified: Secondary | ICD-10-CM

## 2023-11-26 DIAGNOSIS — F17200 Nicotine dependence, unspecified, uncomplicated: Secondary | ICD-10-CM

## 2023-11-26 DIAGNOSIS — B20 Human immunodeficiency virus [HIV] disease: Secondary | ICD-10-CM

## 2023-11-26 NOTE — Therapy (Signed)
 OUTPATIENT PHYSICAL THERAPY NEURO TREATMENT    Patient Name: Bruce Yang MRN: [id number] DOB:August 02, 1971, 53 y.o., male Today's Date: 11/27/2023   PCP: Herbie Drape REFERRING PROVIDER: Dorcas Carrow  END OF SESSION:  PT End of Session - 11/27/23 0931     Visit Number 3    Date for PT Re-Evaluation 12/30/23    Authorization Type [employer]    PT Start Time 0930    PT Stop Time 1015    PT Time Calculation (min) 45 min    Activity Tolerance Patient limited by pain    Behavior During Therapy [facility] for tasks assessed/performed;Flat affect               Past Medical History:  Diagnosis Date   Hyperlipidemia    Hypertension    Recurrent boils of anus    Stroke [facility])    History reviewed. No pertinent surgical history. Patient Active Problem List   Diagnosis Date Noted   History of CVA (cerebrovascular accident) 11/19/2023   Essential hypertension 10/21/2023   Erectile dysfunction 10/21/2023   Aortic atherosclerosis (HCC) 10/21/2023   Carotid artery disease (HCC) 10/21/2023   G6PD deficiency 10/17/2023   HIV disease (HCC) 10/15/2023   Therapeutic drug monitoring 10/15/2023   Alcohol use 10/14/2023   Substance abuse (HCC) 10/14/2023   Cardiac murmur 10/14/2023   Methamphetamine use (HCC) 10/14/2023   Acute CVA (cerebrovascular accident) (HCC) 10/13/2023   Spondylolisthesis, lumbosacral region 09/25/2023   Prediabetes 08/20/2023   Hyperlipidemia 08/20/2023   Hyperglycemia 08/19/2023   Chronic low back pain without sciatica 08/19/2023   Peripheral neuropathy 08/19/2023   Tobacco use disorder 08/19/2023   Marijuana use, continuous 08/19/2023    ONSET DATE: 10/13/23  REFERRING DIAG:  I63.9 (ICD-10-CM) - Cerebrovascular accident (CVA), unspecified mechanism (HCC)    THERAPY DIAG:  Muscle weakness (generalized)  Other lack of coordination  Other abnormalities of gait and mobility  Ataxic gait  Cerebrovascular accident (CVA) due to other mechanism  [facility])  Rationale for Evaluation and Treatment: Rehabilitation  SUBJECTIVE:                                                                                                                                                                                             SUBJECTIVE STATEMENT:  Using the walker every now and then. Larey Seat out the bed this morning, sore on my left side.    Pt accompanied by: self  PERTINENT HISTORY: 53 y.o. male with medical history significant of polysubstance abuse, chronic low back pain presented to ED with complaint of numbness of his left face/arm/leg X 3 days.  In the ED, vital signs  fairly stable.  Labs showed ethanol level 164, UDS positive for amphetamines and THC.  CT head showing new low density within the anterior right thalamus consistent with an infarction, exact age indeterminate. Brain MRI showing a 1 cm acute ischemic nonhemorrhagic right thalamic infarct and a 2 cm acute to early subacute ischemic nonhemorrhagic left basal ganglia infarct. MRI of C-spine negative for acute abnormality. Neurology recommended admission to [facility] for stroke workup. Patient passed his swallow study in the ED. [facility] called to admit.   First stroke was March 31st, second one was 10/13/23   PAIN:  Are you having pain? Yes: NPRS scale: 5/10 Pain location: L leg and L shoulder Pain description: ache, dull, constant Aggravating factors: cold wind, laying for long period of time  Relieving factors: massage, movement, stretching   PRECAUTIONS: Fall  RED FLAGS: None   WEIGHT BEARING RESTRICTIONS: No  FALLS: Has patient fallen in last 6 months? Yes. Number of falls 1  LIVING ENVIRONMENT: Lives with: lives with an adult companion Lives in: House/apartment Stairs: Yes: External: 4 steps; bilateral but cannot reach both Has following equipment at home: Walker - 2 wheeled  PLOF: Independent and Independent with basic ADLs  PATIENT GOALS: walk and move my hands    OBJECTIVE:  Note: Objective measures were completed at Evaluation unless otherwise noted.  DIAGNOSTIC FINDINGS:  CT HEAD AND NECK 1. Areas of acute/subacute infarcts are better demonstrated on recent MRI. 2. Remote infarcts in the thalami and left basal ganglia. 3. Severe stenosis at the petrous segment of the right ICA and moderate at the paraclinoid segment. 4. Diffuse decrease in caliber of the cervical right ICA related to upstream stenosis. 5. Focal moderate stenosis at the right P1-P2 junction and mild stenosis at the proximal P2 segment of the left PCA. 6. Aortic atherosclerosis.   MRI C-SPINE 1. Motion degraded exam. 2. No definite acute abnormality within the cervical spine or spinal cord. 3. Central disc protrusion at C3-4 with resultant mild spinal stenosis. 4. Multifactorial degenerative changes with resultant multilevel foraminal narrowing as above. Notable findings include mild to moderate left C5 foraminal stenosis, moderate to severe right with mild left C6 foraminal narrowing, with moderate right C7 foraminal stenosis.  BRAIN MRI 1. 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 2. 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 3. 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  COGNITION: Overall cognitive status: Impaired   SENSATION: WFL Some tingling on the left side   COORDINATION: Decreased coordination due to muscle weakness and decreased neuromuscular conrol  MUSCLE LENGTH: Hamstrings: tight in BLE   POSTURE: rounded shoulders and forward head  LOWER EXTREMITY ROM:   grossly WFL, some limitations with R ankle DF   LOWER EXTREMITY MMT:  4/5 BLE, 4-/5 for R knee extension and knee flexion    STAIRS: Level of Assistance: CGA Stair Negotiation Technique: Step to Pattern with Single Rail on Left Number of Stairs: 4  Height of Stairs:  6"  Comments: reports he is able to get into home with 4 steps using railing on one side  GAIT: Gait pattern: step through pattern, decreased arm swing- Right, decreased step length- Right, decreased stance time- Right, decreased stride length, decreased hip/knee flexion- Right, decreased ankle dorsiflexion- Right, ataxic, trendelenburg, narrow BOS, poor foot clearance- Right, and poor foot clearance- Left Distance walked: in clinic distances  Assistive device utilized: None Level of assistance: CGA  Comments: unsteady gait, slowed cadence, decreased speed, decreased R hip and knee flexion and is not using RW   FUNCTIONAL TESTS:  5 times sit to stand: 15.82s CGA  Timed up and go (TUG): 18.09s  Berg Balance Scale: 41/56                                                                                                                               TREATMENT DATE:  11/27/23 Recheck goals  HS curls 35# 2x10, LLE 20# x10 Leg ext 10# x10, x10 single leg each side- does better with LLE this way  NuStep L5x30mins LE only needs rest breaks STS 2x10 LE stretching HS, LTR, SKTC Cone tap minA  Calf stretch on slant  Calf raises on bar 2x10  11/20/23 Bridges 2x12 mod cues for good form SLRs 2x12 0# mod cues for good form L LE HS stretches 4x30 seconds Figure 4 piriforms stretches 3x30 seconds B  QL stretches 2x30 seconds forward only  Nustep L3x5 minutes all four extremites to promote reciprocal movement, help build mm endurance    10/21/23- EVAL    PATIENT EDUCATION: Education details: HEP, POC, post stroke aftercare Person educated: Patient and girlfriend Education method: Explanation, Demonstration, and Handouts Education comprehension: verbalized understanding  HOME EXERCISE PROGRAM: Access Code: [email or web address] URL: [email or web address] Date: 10/21/2023 Prepared by: Cassie Freer  Exercises - Sit to Stand  - 1 x daily - 7 x weekly - 2 sets - 10 reps - Heel Raises with  Counter Support  - 1 x daily - 7 x weekly - 2 sets - 10 reps - Standing Hip Abduction with Counter Support  - 1 x daily - 7 x weekly - 2 sets - 10 reps - Standing March with Counter Support  - 1 x daily - 7 x weekly - 2 sets - 10 reps - Seated Hamstring Stretch  - 1 x daily - 7 x weekly - 2 sets - 2 reps - 15-30 hold - Seated Piriformis Stretch with Trunk Bend  - 1 x daily - 7 x weekly - 2 sets - 2 reps - 15-30 hold  GOALS: Goals reviewed with patient? Yes  SHORT TERM GOALS: Target date: 11/25/23  Patient will be independent with initial HEP. Baseline: given 10/21/23 Goal status: IN PROGRESS  2.  Patient will demonstrate decreased fall risk by scoring < 14 sec on TUG. Baseline: 18.09s Goal status: IN PROGRESS 14.50 11/27/23   LONG TERM GOALS: Target date: 12/30/23  Patient will be independent with advanced/ongoing HEP to improve outcomes and carryover.  Baseline:  Goal status: INITIAL  2.  Patient will be able to ambulate 500' with LRAD with good safety to access community.  Baseline: ataxic gait not using RW, see gait analysis above Goal status: INITIAL  3.  Patient will be able to complete 10 consecutive taps on 6" box without loss of balance   Baseline: min-modA with 4" box  Goal status: INITIAL  4.  Patient will demonstrate SLS 10s bilaterally without UE support Baseline: 3s at best  Goal status: INITIAL  5.  Patient will score 49 on Berg Balance test to demonstrate lower risk of falls. (MCID= 8 points)  Baseline: 41 Goal status: INITIAL   ASSESSMENT:  CLINICAL IMPRESSION: Pt arrives ambulating with RW, but only occassionally uses it. He is able to get around in the clinic without it. Needed some rest breaks while on the NuStep. He has good strength in his legs but has some tightness in BLE and coordination deficits that we worked on today. Will progress as tolerated. Ended early today per pt request due to limited endurance and increasing LE pain levels.     EVAL:Patient is a 53 y.o. male who was seen today for physical therapy evaluation and treatment for CVA. He had a stroke last year that affected his R side and then another one on 10/13/23 which affected his L side. His R side deficits are more prominent especially with gait and his RUE. Patient was using a rolling walker in the hospital and was advised to use it upon discharge. However, he has not been compliant with it and is walking independently without an AD despite having ataxic gait pattern. He presents as a fall risk as evident from functional tests completed at evaluation. He will benefit from skilled PT to address his gait and balance abnormalities to decrease his risk for falls and normalize his gait pattern. He reports fatigue after doing functional testing today and would benefit from endurance training as well.   OBJECTIVE IMPAIRMENTS: Abnormal gait, decreased activity tolerance, decreased balance, decreased coordination, decreased endurance, difficulty walking, decreased ROM, decreased strength, improper body mechanics, and pain.   ACTIVITY LIMITATIONS: carrying, lifting, bending, squatting, stairs, transfers, and locomotion level  PARTICIPATION LIMITATIONS: cleaning, driving, shopping, community activity, and yard work  PERSONAL FACTORS: Age, Behavior pattern, Past/current experiences, Time since onset of injury/illness/exacerbation, and 3+ comorbidities: polysubstance abuse, chronic low back pain, peripheral neuropathy, hx of CVA, HIV  are also affecting patient's functional outcome.   REHAB POTENTIAL: Fair insurance visit limitation b/w all three disciplines    CLINICAL DECISION MAKING: Stable/uncomplicated  EVALUATION COMPLEXITY: Low  PLAN:  PT FREQUENCY: 2x/week  PT DURATION: 2 weeks  PLANNED INTERVENTIONS: 97110-Therapeutic exercises, 97530- Therapeutic activity, 97112- Neuromuscular re-education, 97535- Self Care, 16109- Manual therapy, [phone]- Gait training,  Patient/Family education, Balance training, Stair training, Dry Needling, Joint mobilization, Spinal mobilization, Cryotherapy, and Moist heat  PLAN FOR NEXT SESSION: work on strengthening for LE, balance training, address LE and low back pain, update HEP as appropriate and encourage PT attendance   Nedra Hai, PT, DPT 11/27/23 10:15 AM

## 2023-11-27 ENCOUNTER — Ambulatory Visit: Payer: Medicaid Other

## 2023-11-27 ENCOUNTER — Ambulatory Visit: Payer: Medicaid Other | Admitting: Occupational Therapy

## 2023-11-27 ENCOUNTER — Ambulatory Visit: Payer: Medicaid Other | Admitting: Speech Pathology

## 2023-11-27 DIAGNOSIS — R278 Other lack of coordination: Secondary | ICD-10-CM

## 2023-11-27 DIAGNOSIS — R260 Ataxic gait: Secondary | ICD-10-CM

## 2023-11-27 DIAGNOSIS — R41844 Frontal lobe and executive function deficit: Secondary | ICD-10-CM

## 2023-11-27 DIAGNOSIS — R41840 Attention and concentration deficit: Secondary | ICD-10-CM

## 2023-11-27 DIAGNOSIS — I6389 Other cerebral infarction: Secondary | ICD-10-CM

## 2023-11-27 DIAGNOSIS — R41841 Cognitive communication deficit: Secondary | ICD-10-CM | POA: Diagnosis not present

## 2023-11-27 DIAGNOSIS — M6281 Muscle weakness (generalized): Secondary | ICD-10-CM

## 2023-11-27 DIAGNOSIS — R2689 Other abnormalities of gait and mobility: Secondary | ICD-10-CM

## 2023-11-27 DIAGNOSIS — M79602 Pain in left arm: Secondary | ICD-10-CM

## 2023-11-27 NOTE — Patient Instructions (Signed)
 Supine: Chest Press (Active - Assistance)    Lie on back with arms fully extended. With assistance, as needed to keep shoulder down lower bar or dowel to chest and press to arm's length. Complete 1-2___ sets of _5-10__ repetitions. Perform _2__ sessions per day.  Copyright  VHI. All rights reserved.      Shoulder: Flexion (Supine)    With hands shoulder width apart, slowly lower dowel to floor behind head. Do not let elbows bend. Keep back flat. Hold _5___ seconds. Repeat _5-10___ times. Do ____ sessions per day. CAUTION: Stretch slowly and gently.  Copyright  VHI. All rights reserved.    Coordination Activities  Perform the following activities for 10 minutes 1 times per day with both hand(s).  Rotate ball in fingertips (clockwise and counter-clockwise). Pass ball between hands. Flip cards 1 at a time Deal cards with your thumb (Hold deck in hand and push card off top with thumb).

## 2023-11-27 NOTE — Therapy (Addendum)
 OUTPATIENT OCCUPATIONAL THERAPY NEURO Treatment  Patient Name: Bruce Yang MRN: [id number] DOB:[birth year], 53 y.o., male Today's Date: 11/27/2023  PCP: Dr. Elsie Saas PROVIDER: Dr.Ghmire  END OF SESSION:  OT End of Session - 11/27/23 0846     Visit Number 2    Number of Visits 11    Date for OT Re-Evaluation 01/19/24    Authorization Type [employer]    Authorization Time Period 11 weeks    Authorization - Visit Number 1    Authorization - Number of Visits 16    OT Start Time 0844    OT Stop Time 0930    OT Time Calculation (min) 46 min              Past Medical History:  Diagnosis Date   Hyperlipidemia    Hypertension    Recurrent boils of anus    Stroke (HCC)    No past surgical history on file. Patient Active Problem List   Diagnosis Date Noted   History of CVA (cerebrovascular accident) 11/19/2023   Essential hypertension 10/21/2023   Erectile dysfunction 10/21/2023   Aortic atherosclerosis (HCC) 10/21/2023   Carotid artery disease (HCC) 10/21/2023   G6PD deficiency 10/17/2023   HIV disease (HCC) 10/15/2023   Therapeutic drug monitoring 10/15/2023   Alcohol use 10/14/2023   Substance abuse (HCC) 10/14/2023   Cardiac murmur 10/14/2023   Methamphetamine use (HCC) 10/14/2023   Acute CVA (cerebrovascular accident) (HCC) 10/13/2023   Spondylolisthesis, lumbosacral region 09/25/2023   Prediabetes 08/20/2023   Hyperlipidemia 08/20/2023   Hyperglycemia 08/19/2023   Chronic low back pain without sciatica 08/19/2023   Peripheral neuropathy 08/19/2023   Tobacco use disorder 08/19/2023   Marijuana use, continuous 08/19/2023    ONSET DATE: 10/13/23  REFERRING DIAG: 63.9 (ICD-10-CM) - Cerebrovascular accident (CVA), unspecified mechanism (HCC)   THERAPY DIAG:  Muscle weakness (generalized)  Other lack of coordination  Pain in left arm  Attention and concentration deficit  Frontal lobe and executive function deficit  Rationale for Evaluation and  Treatment: Rehabilitation  SUBJECTIVE:   SUBJECTIVE STATEMENT: Pt reports falling out of the bed Pt accompanied by: significant other  PERTINENT HISTORY: 53 y.o. male with medical history significant of polysubstance abuse, chronic low back pain presented to ED with complaint of numbness of his left face/arm/leg X 3 days.  In the ED, vital signs fairly stable.  Labs showed ethanol level 164, UDS positive for amphetamines and THC.  CT head showing new low density within the anterior right thalamus consistent with an infarction, exact age indeterminate. Brain MRI showing a 1 cm acute ischemic nonhemorrhagic right thalamic infarct and a 2 cm acute to early subacute ischemic nonhemorrhagic left basal ganglia infarct.   First stroke was March 31st, second one was 10/13/23  PMH: newly disagnosed HIV, hypertension, hyperlipidemia  DIAGNOSTIC FINDINGS:  CT HEAD AND NECK 1. Areas of acute/subacute infarcts are better demonstrated on recent MRI. 2. Remote infarcts in the thalami and left basal ganglia. 3. Severe stenosis at the petrous segment of the right ICA and moderate at the paraclinoid segment. 4. Diffuse decrease in caliber of the cervical right ICA related to upstream stenosis. 5. Focal moderate stenosis at the right P1-P2 junction and mild stenosis at the proximal P2 segment of the left PCA. 6. Aortic atherosclerosis.     MRI C-SPINE 1. Motion degraded exam. 2. No definite acute abnormality within the cervical spine or spinal cord. 3. Central disc protrusion at C3-4 with resultant mild spinal stenosis. 4. Multifactorial degenerative  changes with resultant multilevel foraminal narrowing as above. Notable findings include mild to moderate left C5 foraminal stenosis, moderate to severe right with mild left C6 foraminal narrowing, with moderate right C7 foraminal stenosis.   BRAIN MRI 1. 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 2. 2 cm acute to early subacute ischemic  nonhemorrhagic left basal ganglia infarct. 3. 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    PRECAUTIONS: Fall and Other: HIV - newly diagnosed  WEIGHT BEARING RESTRICTIONS: No  PAIN:  Are you having pain? Yes: NPRS scale: 5/10 Pain location: left leg Pain description: aching Aggravating factors: ongoing Relieving factors: unknown No arm pain at time of eval however pt reports moderate pain at times in UE's  FALLS: Has patient fallen in last 6 months? No  LIVING ENVIRONMENT: Lives with: lives with their family and lives with an adult companion Lives in: House/apartment Stairs: Yes: External:    PLOF: Independent  PATIENT GOALS: get stronger  OBJECTIVE:  Note: Objective measures were completed at Evaluation unless otherwise noted.  HAND DOMINANCE: Left  ADLs: Overall ADLs: requires assistance Transfers/ambulation related to ADLs: Eating: mod I  Grooming: mod I UB Dressing: min A LB Dressing: minA Toileting: mod I / supervision with RW  Bathing:mod  A Tub Shower transfers: min A Equipment: none  IADLs: Pt has assistance with all IADLs  Light housekeeping: dependent Meal Prep: dependent   MOBILITY STATUS:  mod I to supervision    FUNCTIONAL OUTCOME MEASURES: Quick Dash: 77% disability  UPPER EXTREMITY ROM:    Active ROM Right eval Left eval  Shoulder flexion 130 110  Shoulder abduction  115  Shoulder adduction    Shoulder extension    Shoulder internal rotation    Shoulder external rotation    Elbow flexion    Elbow extension -20 -30  Wrist flexion    Wrist extension    Wrist ulnar deviation    Wrist radial deviation    Wrist pronation    Wrist supination    (Blank rows = not tested)  Grip strength: Right: 40 lbs; Left: 60 lbs  COORDINATION: 9 Hole Peg test: Right: 1 min 17 secs sec; Left: 42.87 sec  SENSATION: Light touch: Impaired for LUE    COGNITION: Overall  cognitive status: Impaired, ot be further assessed in a functional context, pt has decreased safety awareness.    VISION ASSESSMENT: Not tested    OBSERVATIONS: Pleasant gentleman accompanied by his GF                                                                                                                             TREATMENT DATE: 11/27/23- Pt was instructed in supine cane exercises, and coordinationa HEP, mod v.c and facilitation, pt's GF was present and she will be able to assist pt with exercises at home   11/03/23 eval         PATIENT EDUCATION:  Education details: cane exercises beginning coordination exercises, see pt instructions. Person educated: Patient and girlfriend Education method: Explanation, demonstration, v.c  Education comprehension: verbalized understanding, returned demonstration  HOME EXERCISE PROGRAM: cane supine, coordination-11/27/23   GOALS: Goals reviewed with patient? Yes  SHORT TERM GOALS: Target date: 12/04/23  I  with inital HEP Baseline:dependent Goal status: ongoing, issued , needs reinforcement  2.  Pt will perform bathing with min A Baseline: mod A Goal status: ongoing  3.  Pt will perfrom dressing with supervision Baseline: min A Goal status: ongoing  4. Pt will demonstrate improved LUE coordination as eveidenced by decreasing 9 hole peg test score to 39 secs or less Baseline:LUE  42.87 Goal status: ongoing  5.  Pt will demonstrate improved RUE coordination as evidenced by decreasing 9 hole peg test score to 70 secs or less Baseline: RUE 1 min 17 secs Goal status: INITIAL  6.  I with sensory precautions for LUE. Baseline: dependent Goal status: ongoing 7.Pt will retieve a lightweight object at 115 shoulder flexion and -25 elbow extension with LUE.  Baseline: shoulder flexion 110, elbow ext -30  Goal status inital LONG TERM GOALS: Target date: 01/19/24  I with updated HEP Baseline: dependent Goal status:  INITIAL  2.  Pt will demonstrate improved RUE coordination as eveidenced by decreasing 9 hole peg test score to 65 secs or less Baseline: RUE 1 min 17 secs Goal status: INITIAL  3.  Pt will increase RUE grip strength to 45 lbs or greater for increased RUE functional use. Baseline:  grip strength RUE 40 lbs, LUE 60 lbs Goal status: INITIAL  4.  Pt will perform all basic ADLS with supervision Baseline: min-mod A Goal status: INITIAL  5.  Pt will perform light home management with supervision. Baseline: dependent Goal status: INITIAL  6.  Pt will improve Quick Dash core to 72% or better disability Baseline: 77% Goal status: INITIAL  ASSESSMENT:  CLINICAL IMPRESSION:Pt is progressing towards goals. He demonstrates understanding of inital HEP.   PERFORMANCE DEFICITS: in functional skills including ADLs, IADLs, coordination, dexterity, proprioception, sensation, tone, ROM, strength, pain, flexibility, Fine motor control, Gross motor control, mobility, balance, endurance, decreased knowledge of precautions, decreased knowledge of use of DME, and UE functional use, cognitive skills including attention, problem solving, safety awareness, thought, and understand, and psychosocial skills including coping strategies, environmental adaptation, habits, interpersonal interactions, and routines and behaviors.   IMPAIRMENTS: are limiting patient from ADLs, IADLs, rest and sleep, work, play, leisure, and social participation.   CO-MORBIDITIES: may have co-morbidities  that affects occupational performance. Patient will benefit from skilled OT to address above impairments and improve overall function.  MODIFICATION OR ASSISTANCE TO COMPLETE EVALUATION: No modification of tasks or assist necessary to complete an evaluation.  OT OCCUPATIONAL PROFILE AND HISTORY: Detailed assessment: Review of records and additional review of physical, cognitive, psychosocial history related to current functional  performance.  CLINICAL DECISION MAKING: LOW - limited treatment options, no task modification necessary  REHAB POTENTIAL: Good  EVALUATION COMPLEXITY: Low    PLAN:  OT FREQUENCY: 1x/week plus eval  OT DURATION:11 weeks  PLANNED INTERVENTIONS: 97168 OT Re-evaluation, 97535 self care/ADL training, 74259 therapeutic exercise, 97530 therapeutic activity, 97112 neuromuscular re-education, 97140 manual therapy, 97035 ultrasound, 97018 paraffin, 56387 moist heat, 97010 cryotherapy, 97034 contrast bath, 97129 Cognitive training (first 15 min), 56433 Cognitive training(each additional 15 min), 29518 Orthotics management and training, 84166 Splinting (initial encounter), passive range of motion, balance training, functional mobility training, visual/perceptual remediation/compensation, energy  conservation, coping strategies training, patient/family education, and DME and/or AE instructions  RECOMMENDED OTHER SERVICES: PT, ST  CONSULTED AND AGREED WITH PLAN OF CARE: Patient and family member/caregiver  PLAN FOR NEXT SESSION:reinforce and add to HEP, ADL strategies   Marita Burnsed, OT 11/27/2023, 9:47 AM

## 2023-11-28 ENCOUNTER — Other Ambulatory Visit: Payer: Self-pay

## 2023-11-28 ENCOUNTER — Telehealth: Payer: Self-pay

## 2023-11-28 NOTE — Telephone Encounter (Signed)
 Called patient to check in and schedule appointment, no answer. Left HIPAA compliant voicemail requesting callback.   Sandie Ano, RN

## 2023-12-01 ENCOUNTER — Other Ambulatory Visit: Payer: Self-pay

## 2023-12-01 ENCOUNTER — Other Ambulatory Visit: Payer: Self-pay | Admitting: Pharmacy Technician

## 2023-12-01 NOTE — Progress Notes (Signed)
 Specialty Pharmacy Refill Coordination Note  Bruce Yang is a 53 y.o. male contacted today regarding refills of specialty medication(s) Dolutegravir-lamiVUDine (Dovato)   Patient requested Delivery   Delivery date: 12/03/23   Verified address: Patient address [address]  [place]   Medication will be filled on 12/02/23.

## 2023-12-02 ENCOUNTER — Other Ambulatory Visit: Payer: Self-pay

## 2023-12-02 ENCOUNTER — Other Ambulatory Visit (HOSPITAL_COMMUNITY): Payer: Self-pay

## 2023-12-02 NOTE — Therapy (Signed)
 OUTPATIENT PHYSICAL THERAPY NEURO TREATMENT    Patient Name: Bruce Yang MRN: [id number] DOB:[birth year], 53 y.o., male Today's Date: 12/03/2023   PCP: Herbie Drape REFERRING PROVIDER: Dorcas Carrow  END OF SESSION:  PT End of Session - 12/03/23 1056     Visit Number 4    Date for PT Re-Evaluation 12/30/23    Authorization Type [employer]    PT Start Time 1100    PT Stop Time 1145    PT Time Calculation (min) 45 min    Activity Tolerance Patient limited by pain    Behavior During Therapy [facility] for tasks assessed/performed;Flat affect                Past Medical History:  Diagnosis Date   Hyperlipidemia    Hypertension    Recurrent boils of anus    Stroke [facility])    History reviewed. No pertinent surgical history. Patient Active Problem List   Diagnosis Date Noted   History of CVA (cerebrovascular accident) 11/19/2023   Essential hypertension 10/21/2023   Erectile dysfunction 10/21/2023   Aortic atherosclerosis (HCC) 10/21/2023   Carotid artery disease (HCC) 10/21/2023   G6PD deficiency 10/17/2023   HIV disease (HCC) 10/15/2023   Therapeutic drug monitoring 10/15/2023   Alcohol use 10/14/2023   Substance abuse (HCC) 10/14/2023   Cardiac murmur 10/14/2023   Methamphetamine use (HCC) 10/14/2023   Acute CVA (cerebrovascular accident) (HCC) 10/13/2023   Spondylolisthesis, lumbosacral region 09/25/2023   Prediabetes 08/20/2023   Hyperlipidemia 08/20/2023   Hyperglycemia 08/19/2023   Chronic low back pain without sciatica 08/19/2023   Peripheral neuropathy 08/19/2023   Tobacco use disorder 08/19/2023   Marijuana use, continuous 08/19/2023    ONSET DATE: 10/13/23  REFERRING DIAG:  I63.9 (ICD-10-CM) - Cerebrovascular accident (CVA), unspecified mechanism (HCC)    THERAPY DIAG:  Cerebrovascular accident (CVA) due to other mechanism (HCC)  Muscle weakness (generalized)  Other lack of coordination  Other abnormalities of gait and mobility  Ataxic  gait  Rationale for Evaluation and Treatment: Rehabilitation  SUBJECTIVE:                                                                                                                                                                                             SUBJECTIVE STATEMENT: My leg starting hurting just now.    Pt accompanied by: self  PERTINENT HISTORY: 53 y.o. male with medical history significant of polysubstance abuse, chronic low back pain presented to ED with complaint of numbness of his left face/arm/leg X 3 days.  In the ED, vital signs fairly stable.  Labs showed ethanol level 164, UDS positive for amphetamines  and THC.  CT head showing new low density within the anterior right thalamus consistent with an infarction, exact age indeterminate. Brain MRI showing a 1 cm acute ischemic nonhemorrhagic right thalamic infarct and a 2 cm acute to early subacute ischemic nonhemorrhagic left basal ganglia infarct. MRI of C-spine negative for acute abnormality. Neurology recommended admission to [facility] for stroke workup. Patient passed his swallow study in the ED. [facility] called to admit.   First stroke was March 31st, second one was 10/13/23   PAIN:  Are you having pain? Yes: NPRS scale: 5/10 Pain location: L leg and L shoulder Pain description: ache, dull, constant Aggravating factors: cold wind, laying for long period of time  Relieving factors: massage, movement, stretching   PRECAUTIONS: Fall  RED FLAGS: None   WEIGHT BEARING RESTRICTIONS: No  FALLS: Has patient fallen in last 6 months? Yes. Number of falls 1  LIVING ENVIRONMENT: Lives with: lives with an adult companion Lives in: House/apartment Stairs: Yes: External: 4 steps; bilateral but cannot reach both Has following equipment at home: Walker - 2 wheeled  PLOF: Independent and Independent with basic ADLs  PATIENT GOALS: walk and move my hands   OBJECTIVE:  Note: Objective measures were completed at  Evaluation unless otherwise noted.  DIAGNOSTIC FINDINGS:  CT HEAD AND NECK 1. Areas of acute/subacute infarcts are better demonstrated on recent MRI. 2. Remote infarcts in the thalami and left basal ganglia. 3. Severe stenosis at the petrous segment of the right ICA and moderate at the paraclinoid segment. 4. Diffuse decrease in caliber of the cervical right ICA related to upstream stenosis. 5. Focal moderate stenosis at the right P1-P2 junction and mild stenosis at the proximal P2 segment of the left PCA. 6. Aortic atherosclerosis.   MRI C-SPINE 1. Motion degraded exam. 2. No definite acute abnormality within the cervical spine or spinal cord. 3. Central disc protrusion at C3-4 with resultant mild spinal stenosis. 4. Multifactorial degenerative changes with resultant multilevel foraminal narrowing as above. Notable findings include mild to moderate left C5 foraminal stenosis, moderate to severe right with mild left C6 foraminal narrowing, with moderate right C7 foraminal stenosis.  BRAIN MRI 1. 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 2. 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 3. 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  COGNITION: Overall cognitive status: Impaired   SENSATION: WFL Some tingling on the left side   COORDINATION: Decreased coordination due to muscle weakness and decreased neuromuscular conrol  MUSCLE LENGTH: Hamstrings: tight in BLE   POSTURE: rounded shoulders and forward head  LOWER EXTREMITY ROM:   grossly WFL, some limitations with R ankle DF   LOWER EXTREMITY MMT:  4/5 BLE, 4-/5 for R knee extension and knee flexion    STAIRS: Level of Assistance: CGA Stair Negotiation Technique: Step to Pattern with Single Rail on Left Number of Stairs: 4  Height of Stairs: 6"  Comments: reports he is able to get into home with 4  steps using railing on one side  GAIT: Gait pattern: step through pattern, decreased arm swing- Right, decreased step length- Right, decreased stance time- Right, decreased stride length, decreased hip/knee flexion- Right, decreased ankle dorsiflexion- Right, ataxic, trendelenburg, narrow BOS, poor foot clearance- Right, and poor foot clearance- Left Distance walked: in clinic distances  Assistive device utilized: None Level of assistance: CGA Comments: unsteady gait, slowed cadence, decreased speed, decreased R hip and knee  flexion and is not using RW   FUNCTIONAL TESTS:  5 times sit to stand: 15.82s CGA  Timed up and go (TUG): 18.09s  Berg Balance Scale: 41/56                                                                                                                               TREATMENT DATE:  12/03/23 LE stretching HS, LTR, SKTC Feet on pball rotations and knees to chest  Pball roll outs x10  Bike L2 x55mins Nustep L1 x97mins LE only Calf stretch 30s  Box taps 6"  STS holding yellow ball 2x10  Leg press 20# 2x10, 20# SL each side   11/27/23 Recheck goals  HS curls 35# 2x10, LLE 20# x10 Leg ext 10# x10, x10 single leg each side- does better with LLE this way  NuStep L5x35mins LE only needs rest breaks STS 2x10 LE stretching HS, LTR, SKTC Cone tap minA  Calf stretch on slant  Calf raises on bar 2x10  11/20/23 Bridges 2x12 mod cues for good form SLRs 2x12 0# mod cues for good form L LE HS stretches 4x30 seconds Figure 4 piriforms stretches 3x30 seconds B  QL stretches 2x30 seconds forward only  Nustep L3x5 minutes all four extremites to promote reciprocal movement, help build mm endurance    10/21/23- EVAL    PATIENT EDUCATION: Education details: HEP, POC, post stroke aftercare Person educated: Patient and girlfriend Education method: Explanation, Demonstration, and Handouts Education comprehension: verbalized understanding  HOME EXERCISE PROGRAM: Access  Code: [email or web address] URL: [email or web address] Date: 10/21/2023 Prepared by: Cassie Freer  Exercises - Sit to Stand  - 1 x daily - 7 x weekly - 2 sets - 10 reps - Heel Raises with Counter Support  - 1 x daily - 7 x weekly - 2 sets - 10 reps - Standing Hip Abduction with Counter Support  - 1 x daily - 7 x weekly - 2 sets - 10 reps - Standing March with Counter Support  - 1 x daily - 7 x weekly - 2 sets - 10 reps - Seated Hamstring Stretch  - 1 x daily - 7 x weekly - 2 sets - 2 reps - 15-30 hold - Seated Piriformis Stretch with Trunk Bend  - 1 x daily - 7 x weekly - 2 sets - 2 reps - 15-30 hold  GOALS: Goals reviewed with patient? Yes  SHORT TERM GOALS: Target date: 11/25/23  Patient will be independent with initial HEP. Baseline: given 10/21/23 Goal status: IN PROGRESS  2.  Patient will demonstrate decreased fall risk by scoring < 14 sec on TUG. Baseline: 18.09s Goal status: IN PROGRESS 14.50 11/27/23   LONG TERM GOALS: Target date: 12/30/23  Patient will be independent with advanced/ongoing HEP to improve outcomes and carryover.  Baseline:  Goal status: INITIAL  2.  Patient will be able to ambulate 500' with LRAD with good safety to access community.  Baseline: ataxic gait  not using RW, see gait analysis above Goal status: INITIAL  3.  Patient will be able to complete 10 consecutive taps on 6" box without loss of balance   Baseline: min-modA with 4" box  Goal status: INITIAL   4.  Patient will demonstrate SLS 10s bilaterally without UE support Baseline: 3s at best  Goal status: INITIAL  5.  Patient will score 49 on Berg Balance test to demonstrate lower risk of falls. (MCID= 8 points)  Baseline: 41 Goal status: INITIAL   ASSESSMENT:  CLINICAL IMPRESSION: Pt reports increased leg pain today, states it started when he got out of the car. He says it feels stiff. Tried to do some bridges but patient reports pain in back, so we stopped. He weight shifts on to the  LLE with STS and needs cues to equalize weight. Will progress as tolerated. Reports "the leg is moving now" at end of session.  EVAL:Patient is a 53 y.o. male who was seen today for physical therapy evaluation and treatment for CVA. He had a stroke last year that affected his R side and then another one on 10/13/23 which affected his L side. His R side deficits are more prominent especially with gait and his RUE. Patient was using a rolling walker in the hospital and was advised to use it upon discharge. However, he has not been compliant with it and is walking independently without an AD despite having ataxic gait pattern. He presents as a fall risk as evident from functional tests completed at evaluation. He will benefit from skilled PT to address his gait and balance abnormalities to decrease his risk for falls and normalize his gait pattern. He reports fatigue after doing functional testing today and would benefit from endurance training as well.   OBJECTIVE IMPAIRMENTS: Abnormal gait, decreased activity tolerance, decreased balance, decreased coordination, decreased endurance, difficulty walking, decreased ROM, decreased strength, improper body mechanics, and pain.   ACTIVITY LIMITATIONS: carrying, lifting, bending, squatting, stairs, transfers, and locomotion level  PARTICIPATION LIMITATIONS: cleaning, driving, shopping, community activity, and yard work  PERSONAL FACTORS: Age, Behavior pattern, Past/current experiences, Time since onset of injury/illness/exacerbation, and 3+ comorbidities: polysubstance abuse, chronic low back pain, peripheral neuropathy, hx of CVA, HIV  are also affecting patient's functional outcome.   REHAB POTENTIAL: Fair insurance visit limitation b/w all three disciplines    CLINICAL DECISION MAKING: Stable/uncomplicated  EVALUATION COMPLEXITY: Low  PLAN:  PT FREQUENCY: 2x/week  PT DURATION: 2 weeks  PLANNED INTERVENTIONS: 97110-Therapeutic exercises, 97530-  Therapeutic activity, 97112- Neuromuscular re-education, 97535- Self Care, 95188- Manual therapy, [phone]- Gait training, Patient/Family education, Balance training, Stair training, Dry Needling, Joint mobilization, Spinal mobilization, Cryotherapy, and Moist heat  PLAN FOR NEXT SESSION: work on strengthening for LE, balance training, address LE and low back pain, update HEP as appropriate and encourage PT attendance   Nedra Hai, PT, DPT 12/03/23 11:41 AM

## 2023-12-03 ENCOUNTER — Ambulatory Visit: Payer: Medicaid Other | Admitting: Speech Pathology

## 2023-12-03 ENCOUNTER — Ambulatory Visit: Payer: Medicaid Other

## 2023-12-03 ENCOUNTER — Telehealth: Payer: Self-pay | Admitting: Physical Medicine and Rehabilitation

## 2023-12-03 ENCOUNTER — Ambulatory Visit: Payer: Medicaid Other | Admitting: Occupational Therapy

## 2023-12-03 DIAGNOSIS — R278 Other lack of coordination: Secondary | ICD-10-CM

## 2023-12-03 DIAGNOSIS — M6281 Muscle weakness (generalized): Secondary | ICD-10-CM

## 2023-12-03 DIAGNOSIS — R41840 Attention and concentration deficit: Secondary | ICD-10-CM

## 2023-12-03 DIAGNOSIS — R2689 Other abnormalities of gait and mobility: Secondary | ICD-10-CM

## 2023-12-03 DIAGNOSIS — M79602 Pain in left arm: Secondary | ICD-10-CM

## 2023-12-03 DIAGNOSIS — R260 Ataxic gait: Secondary | ICD-10-CM

## 2023-12-03 DIAGNOSIS — R41841 Cognitive communication deficit: Secondary | ICD-10-CM | POA: Diagnosis not present

## 2023-12-03 DIAGNOSIS — I6389 Other cerebral infarction: Secondary | ICD-10-CM

## 2023-12-03 DIAGNOSIS — R41844 Frontal lobe and executive function deficit: Secondary | ICD-10-CM

## 2023-12-03 NOTE — Telephone Encounter (Signed)
 Pt's girlfriend Rosita Fire called requesting a call about pt injection. See chart messages and call pt with update or next plan of action. Phone number is [phone]

## 2023-12-03 NOTE — Therapy (Signed)
 OUTPATIENT OCCUPATIONAL THERAPY NEURO Treatment  Patient Name: Bruce Yang MRN: [id number] DOB:04-02-1971, 53 y.o., male Today's Date: 12/03/2023  PCP: Dr. Elsie Saas PROVIDER: Dr.Ghmire  END OF SESSION:  OT End of Session - 12/03/23 0937     Visit Number 3    Number of Visits 11    Authorization Type [employer]    Authorization Time Period 11 weeks    Authorization - Visit Number 2    Authorization - Number of Visits 16    OT Start Time 0935    OT Stop Time 1013    OT Time Calculation (min) 38 min    Activity Tolerance Patient tolerated treatment well    Behavior During Therapy WFL for tasks assessed/performed;Flat affect               Past Medical History:  Diagnosis Date   Hyperlipidemia    Hypertension    Recurrent boils of anus    Stroke (HCC)    No past surgical history on file. Patient Active Problem List   Diagnosis Date Noted   History of CVA (cerebrovascular accident) 11/19/2023   Essential hypertension 10/21/2023   Erectile dysfunction 10/21/2023   Aortic atherosclerosis (HCC) 10/21/2023   Carotid artery disease (HCC) 10/21/2023   G6PD deficiency 10/17/2023   HIV disease (HCC) 10/15/2023   Therapeutic drug monitoring 10/15/2023   Alcohol use 10/14/2023   Substance abuse (HCC) 10/14/2023   Cardiac murmur 10/14/2023   Methamphetamine use (HCC) 10/14/2023   Acute CVA (cerebrovascular accident) (HCC) 10/13/2023   Spondylolisthesis, lumbosacral region 09/25/2023   Prediabetes 08/20/2023   Hyperlipidemia 08/20/2023   Hyperglycemia 08/19/2023   Chronic low back pain without sciatica 08/19/2023   Peripheral neuropathy 08/19/2023   Tobacco use disorder 08/19/2023   Marijuana use, continuous 08/19/2023    ONSET DATE: 10/13/23  REFERRING DIAG: 63.9 (ICD-10-CM) - Cerebrovascular accident (CVA), unspecified mechanism (HCC)   THERAPY DIAG:  Muscle weakness (generalized)  Other lack of coordination  Other abnormalities of gait and  mobility  Pain in left arm  Attention and concentration deficit  Frontal lobe and executive function deficit  Rationale for Evaluation and Treatment: Rehabilitation  SUBJECTIVE:   SUBJECTIVE STATEMENT: Pt reports  exercising at home Pt accompanied by: significant other  PERTINENT HISTORY: 53 y.o. male with medical history significant of polysubstance abuse, chronic low back pain presented to ED with complaint of numbness of his left face/arm/leg X 3 days.  In the ED, vital signs fairly stable.  Labs showed ethanol level 164, UDS positive for amphetamines and THC.  CT head showing new low density within the anterior right thalamus consistent with an infarction, exact age indeterminate. Brain MRI showing a 1 cm acute ischemic nonhemorrhagic right thalamic infarct and a 2 cm acute to early subacute ischemic nonhemorrhagic left basal ganglia infarct.   First stroke was March 31st, second one was 10/13/23  PMH: newly disagnosed HIV, hypertension, hyperlipidemia  DIAGNOSTIC FINDINGS:  CT HEAD AND NECK 1. Areas of acute/subacute infarcts are better demonstrated on recent MRI. 2. Remote infarcts in the thalami and left basal ganglia. 3. Severe stenosis at the petrous segment of the right ICA and moderate at the paraclinoid segment. 4. Diffuse decrease in caliber of the cervical right ICA related to upstream stenosis. 5. Focal moderate stenosis at the right P1-P2 junction and mild stenosis at the proximal P2 segment of the left PCA. 6. Aortic atherosclerosis.     MRI C-SPINE 1. Motion degraded exam. 2. No definite acute abnormality within the  cervical spine or spinal cord. 3. Central disc protrusion at C3-4 with resultant mild spinal stenosis. 4. Multifactorial degenerative changes with resultant multilevel foraminal narrowing as above. Notable findings include mild to moderate left C5 foraminal stenosis, moderate to severe right with mild left C6 foraminal narrowing, with moderate right C7  foraminal stenosis.   BRAIN MRI 1. 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 2. 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 3. 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    PRECAUTIONS: Fall and Other: HIV - newly diagnosed  WEIGHT BEARING RESTRICTIONS: No  PAIN:  Are you having pain? Yes: NPRS scale: 5/10 Pain location: left leg Pain description: aching Aggravating factors: ongoing Relieving factors: unknown No arm pain at time of eval however pt reports moderate pain at times in UE's  FALLS: Has patient fallen in last 6 months? No  LIVING ENVIRONMENT: Lives with: lives with their family and lives with an adult companion Lives in: House/apartment Stairs: Yes: External:    PLOF: Independent  PATIENT GOALS: get stronger  OBJECTIVE:  Note: Objective measures were completed at Evaluation unless otherwise noted.  HAND DOMINANCE: Left  ADLs: Overall ADLs: requires assistance Transfers/ambulation related to ADLs: Eating: mod I  Grooming: mod I UB Dressing: min A LB Dressing: minA Toileting: mod I / supervision with RW  Bathing:mod  A Tub Shower transfers: min A Equipment: none  IADLs: Pt has assistance with all IADLs  Light housekeeping: dependent Meal Prep: dependent   MOBILITY STATUS:  mod I to supervision    FUNCTIONAL OUTCOME MEASURES: Quick Dash: 77% disability  UPPER EXTREMITY ROM:    Active ROM Right eval Left eval  Shoulder flexion 130 110  Shoulder abduction  115  Shoulder adduction    Shoulder extension    Shoulder internal rotation    Shoulder external rotation    Elbow flexion    Elbow extension -20 -30  Wrist flexion    Wrist extension    Wrist ulnar deviation    Wrist radial deviation    Wrist pronation    Wrist supination    (Blank rows = not tested)  Grip strength: Right: 40 lbs; Left: 60  lbs  COORDINATION: 9 Hole Peg test: Right: 1 min 17 secs sec; Left: 42.87 sec  SENSATION: Light touch: Impaired for LUE    COGNITION: Overall cognitive status: Impaired, ot be further assessed in a functional context, pt has decreased safety awareness.    VISION ASSESSMENT: Not tested    OBSERVATIONS: Pleasant gentleman accompanied by his GF                                                                                                                             TREATMENT DATE: 12/03/23- Pt placed large pegs into pegboard with right and left UE's whith hotpack on left shoulder x 8 mins for discomfort, no adverse reactions  with good accuracy,min v.c to avoidcompensation. Copying small peg design with left.and right UE's for increased fine motor coordination, min difficulty with LUE, min-mod difficulty with RUE. Reviewed cane exercises in supine for chest press and shoulder flexion, 2 sets of 10 reps, min-mod v.c Seated closed chain chest press and low range shoulder fleixon, min v.c  UBE x 6 mins level 1 for conditioning.  11/27/23- Pt was instructed in supine cane exercises, and coordination HEP, mod v.c and facilitation, pt's GF was present and she will be able to assist pt with exercises at home   11/03/23 eval         PATIENT EDUCATION: Education details: cane exercise review Person educated: Patient and   Education method: Explanation, demonstration, v.c  Education comprehension: verbalized understanding, returned demonstration  HOME EXERCISE PROGRAM: cane supine, coordination-11/27/23   GOALS: Goals reviewed with patient? Yes  SHORT TERM GOALS: Target date: 12/04/23- evtended due to missed visits   I  with inital HEP Baseline:dependent Goal status: ongoing, issued , needs reinforcement  2.  Pt will perform bathing with min A Baseline: mod A Goal status: ongoing  3.  Pt will perfrom dressing with supervision Baseline: min A Goal status: ongoing  4.  Pt will demonstrate improved LUE coordination as eveidenced by decreasing 9 hole peg test score to 39 secs or less Baseline:LUE  42.87 Goal status: ongoing  5.  Pt will demonstrate improved RUE coordination as evidenced by decreasing 9 hole peg test score to 70 secs or less Baseline: RUE 1 min 17 secs Goal status: INITIAL  6.  I with sensory precautions for LUE. Baseline: dependent Goal status: ongoing 7.Pt will retieve a lightweight object at 115 shoulder flexion and -25 elbow extension with LUE.  Baseline: shoulder flexion 110, elbow ext -30  Goal status ongoing LONG TERM GOALS: Target date: 01/19/24  I with updated HEP Baseline: dependent Goal status: INITIAL  2.  Pt will demonstrate improved RUE coordination as evidenced by decreasing 9 hole peg test score to 65 secs or less Baseline: RUE 1 min 17 secs Goal status: INITIAL  3.  Pt will increase RUE grip strength to 45 lbs or greater for increased RUE functional use. Baseline:  grip strength RUE 40 lbs, LUE 60 lbs Goal status: INITIAL  4.  Pt will perform all basic ADLS with supervision Baseline: min-mod A Goal status: INITIAL  5.  Pt will perform light home management with supervision. Baseline: dependent Goal status: INITIAL  6.  Pt will improve Quick Dash core to 72% or better disability Baseline: 77% Goal status: INITIAL  ASSESSMENT:  CLINICAL IMPRESSION:Pt is progressing towards goals. He demonstrates improved fine motor coordination   PERFORMANCE DEFICITS: in functional skills including ADLs, IADLs, coordination, dexterity, proprioception, sensation, tone, ROM, strength, pain, flexibility, Fine motor control, Gross motor control, mobility, balance, endurance, decreased knowledge of precautions, decreased knowledge of use of DME, and UE functional use, cognitive skills including attention, problem solving, safety awareness, thought, and understand, and psychosocial skills including coping strategies, environmental  adaptation, habits, interpersonal interactions, and routines and behaviors.   IMPAIRMENTS: are limiting patient from ADLs, IADLs, rest and sleep, work, play, leisure, and social participation.   CO-MORBIDITIES: may have co-morbidities  that affects occupational performance. Patient will benefit from skilled OT to address above impairments and improve overall function.  MODIFICATION OR ASSISTANCE TO COMPLETE EVALUATION: No modification of tasks or assist necessary to complete an evaluation.  OT OCCUPATIONAL PROFILE AND HISTORY: Detailed assessment: Review of records and  additional review of physical, cognitive, psychosocial history related to current functional performance.  CLINICAL DECISION MAKING: LOW - limited treatment options, no task modification necessary  REHAB POTENTIAL: Good  EVALUATION COMPLEXITY: Low    PLAN:  OT FREQUENCY: 1x/week plus eval  OT DURATION:11 weeks  PLANNED INTERVENTIONS: 97168 OT Re-evaluation, 97535 self care/ADL training, 10272 therapeutic exercise, 97530 therapeutic activity, 97112 neuromuscular re-education, 97140 manual therapy, 97035 ultrasound, 97018 paraffin, 53664 moist heat, 97010 cryotherapy, 97034 contrast bath, 97129 Cognitive training (first 15 min), 40347 Cognitive training(each additional 15 min), 42595 Orthotics management and training, 63875 Splinting (initial encounter), passive range of motion, balance training, functional mobility training, visual/perceptual remediation/compensation, energy conservation, coping strategies training, patient/family education, and DME and/or AE instructions  RECOMMENDED OTHER SERVICES: PT, ST  CONSULTED AND AGREED WITH PLAN OF CARE: Patient and family member/caregiver  PLAN FOR NEXT SESSION: check short term goals , ADL strategies    Pippa Hanif, OT 12/03/2023, 9:38 AM

## 2023-12-05 ENCOUNTER — Encounter: Payer: Medicaid Other | Admitting: Occupational Therapy

## 2023-12-06 DIAGNOSIS — Z419 Encounter for procedure for purposes other than remedying health state, unspecified: Secondary | ICD-10-CM | POA: Diagnosis not present

## 2023-12-12 ENCOUNTER — Ambulatory Visit: Payer: Medicaid Other

## 2023-12-12 ENCOUNTER — Ambulatory Visit: Payer: Medicaid Other | Admitting: Occupational Therapy

## 2023-12-16 ENCOUNTER — Encounter: Admitting: Physical Therapy

## 2023-12-16 ENCOUNTER — Encounter: Admitting: Occupational Therapy

## 2023-12-16 NOTE — Therapy (Deleted)
 OUTPATIENT OCCUPATIONAL THERAPY NEURO Treatment  Patient Name: Bruce Yang MRN: [id number] DOB:04/21/1971, 53 y.o., male Today's Date: 12/16/2023  PCP: Dr. Elsie Saas PROVIDER: Dr.Ghmire  END OF SESSION:      Past Medical History:  Diagnosis Date   Hyperlipidemia    Hypertension    Recurrent boils of anus    Stroke [facility])    No past surgical history on file. Patient Active Problem List   Diagnosis Date Noted   History of CVA (cerebrovascular accident) 11/19/2023   Essential hypertension 10/21/2023   Erectile dysfunction 10/21/2023   Aortic atherosclerosis (HCC) 10/21/2023   Carotid artery disease (HCC) 10/21/2023   G6PD deficiency 10/17/2023   HIV disease (HCC) 10/15/2023   Therapeutic drug monitoring 10/15/2023   Alcohol use 10/14/2023   Substance abuse (HCC) 10/14/2023   Cardiac murmur 10/14/2023   Methamphetamine use (HCC) 10/14/2023   Acute CVA (cerebrovascular accident) (HCC) 10/13/2023   Spondylolisthesis, lumbosacral region 09/25/2023   Prediabetes 08/20/2023   Hyperlipidemia 08/20/2023   Hyperglycemia 08/19/2023   Chronic low back pain without sciatica 08/19/2023   Peripheral neuropathy 08/19/2023   Tobacco use disorder 08/19/2023   Marijuana use, continuous 08/19/2023    ONSET DATE: 10/13/23  REFERRING DIAG: 63.9 (ICD-10-CM) - Cerebrovascular accident (CVA), unspecified mechanism (HCC)   THERAPY DIAG:  No diagnosis found.  Rationale for Evaluation and Treatment: Rehabilitation  SUBJECTIVE:   SUBJECTIVE STATEMENT: Pt reports  exercising at home Pt accompanied by: significant other  PERTINENT HISTORY: 53 y.o. male with medical history significant of polysubstance abuse, chronic low back pain presented to ED with complaint of numbness of his left face/arm/leg X 3 days.  In the ED, vital signs fairly stable.  Labs showed ethanol level 164, UDS positive for amphetamines and THC.  CT head showing new low density within the anterior right  thalamus consistent with an infarction, exact age indeterminate. Brain MRI showing a 1 cm acute ischemic nonhemorrhagic right thalamic infarct and a 2 cm acute to early subacute ischemic nonhemorrhagic left basal ganglia infarct.   First stroke was March 31st, second one was 10/13/23  PMH: newly disagnosed HIV, hypertension, hyperlipidemia  DIAGNOSTIC FINDINGS:  CT HEAD AND NECK 1. Areas of acute/subacute infarcts are better demonstrated on recent MRI. 2. Remote infarcts in the thalami and left basal ganglia. 3. Severe stenosis at the petrous segment of the right ICA and moderate at the paraclinoid segment. 4. Diffuse decrease in caliber of the cervical right ICA related to upstream stenosis. 5. Focal moderate stenosis at the right P1-P2 junction and mild stenosis at the proximal P2 segment of the left PCA. 6. Aortic atherosclerosis.     MRI C-SPINE 1. Motion degraded exam. 2. No definite acute abnormality within the cervical spine or spinal cord. 3. Central disc protrusion at C3-4 with resultant mild spinal stenosis. 4. Multifactorial degenerative changes with resultant multilevel foraminal narrowing as above. Notable findings include mild to moderate left C5 foraminal stenosis, moderate to severe right with mild left C6 foraminal narrowing, with moderate right C7 foraminal stenosis.   BRAIN MRI 1. 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 2. 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 3. 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    PRECAUTIONS: Fall and Other: HIV - newly diagnosed  WEIGHT BEARING RESTRICTIONS: No  PAIN:  Are you having pain? Yes: NPRS scale: 5/10 Pain location: left leg Pain description: aching Aggravating factors: ongoing Relieving factors: unknown  No arm pain at time of eval however pt reports moderate pain at times in UE's  FALLS: Has  patient fallen in last 6 months? No  LIVING ENVIRONMENT: Lives with: lives with their family and lives with an adult companion Lives in: House/apartment Stairs: Yes: External:    PLOF: Independent  PATIENT GOALS: get stronger  OBJECTIVE:  Note: Objective measures were completed at Evaluation unless otherwise noted.  HAND DOMINANCE: Left  ADLs: Overall ADLs: requires assistance Transfers/ambulation related to ADLs: Eating: mod I  Grooming: mod I UB Dressing: min A LB Dressing: minA Toileting: mod I / supervision with RW  Bathing:mod  A Tub Shower transfers: min A Equipment: none  IADLs: Pt has assistance with all IADLs  Light housekeeping: dependent Meal Prep: dependent   MOBILITY STATUS:  mod I to supervision    FUNCTIONAL OUTCOME MEASURES: Quick Dash: 77% disability  UPPER EXTREMITY ROM:    Active ROM Right eval Left eval  Shoulder flexion 130 110  Shoulder abduction  115  Shoulder adduction    Shoulder extension    Shoulder internal rotation    Shoulder external rotation    Elbow flexion    Elbow extension -20 -30  Wrist flexion    Wrist extension    Wrist ulnar deviation    Wrist radial deviation    Wrist pronation    Wrist supination    (Blank rows = not tested)  Grip strength: Right: 40 lbs; Left: 60 lbs  COORDINATION: 9 Hole Peg test: Right: 1 min 17 secs sec; Left: 42.87 sec  SENSATION: Light touch: Impaired for LUE    COGNITION: Overall cognitive status: Impaired, ot be further assessed in a functional context, pt has decreased safety awareness.    VISION ASSESSMENT: Not tested    OBSERVATIONS: Pleasant gentleman accompanied by his GF                                                                                                                             TREATMENT DATE: 12/03/23- Pt placed large pegs into pegboard with right and left UE's whith hotpack on left shoulder x 8 mins for discomfort, no adverse reactions  with  good accuracy,min v.c to avoidcompensation. Copying small peg design with left.and right UE's for increased fine motor coordination, min difficulty with LUE, min-mod difficulty with RUE. Reviewed cane exercises in supine for chest press and shoulder flexion, 2 sets of 10 reps, min-mod v.c Seated closed chain chest press and low range shoulder fleixon, min v.c  UBE x 6 mins level 1 for conditioning.  11/27/23- Pt was instructed in supine cane exercises, and coordination HEP, mod v.c and facilitation, pt's GF was present and she will be able to assist pt with exercises at home   11/03/23 eval         PATIENT EDUCATION: Education details: cane exercise review Person educated: Patient and   Education method: Explanation, demonstration, v.c  Education comprehension: verbalized understanding, returned  demonstration  HOME EXERCISE PROGRAM: cane supine, coordination-11/27/23   GOALS: Goals reviewed with patient? Yes  SHORT TERM GOALS: Target date: 12/04/23- evtended due to missed visits   I  with inital HEP Baseline:dependent Goal status: ongoing, issued , needs reinforcement  2.  Pt will perform bathing with min A Baseline: mod A Goal status: ongoing  3.  Pt will perfrom dressing with supervision Baseline: min A Goal status: ongoing  4. Pt will demonstrate improved LUE coordination as eveidenced by decreasing 9 hole peg test score to 39 secs or less Baseline:LUE  42.87 Goal status: ongoing  5.  Pt will demonstrate improved RUE coordination as evidenced by decreasing 9 hole peg test score to 70 secs or less Baseline: RUE 1 min 17 secs Goal status: INITIAL  6.  I with sensory precautions for LUE. Baseline: dependent Goal status: ongoing 7.Pt will retieve a lightweight object at 115 shoulder flexion and -25 elbow extension with LUE.  Baseline: shoulder flexion 110, elbow ext -30  Goal status ongoing LONG TERM GOALS: Target date: 01/19/24  I with updated HEP Baseline:  dependent Goal status: INITIAL  2.  Pt will demonstrate improved RUE coordination as evidenced by decreasing 9 hole peg test score to 65 secs or less Baseline: RUE 1 min 17 secs Goal status: INITIAL  3.  Pt will increase RUE grip strength to 45 lbs or greater for increased RUE functional use. Baseline:  grip strength RUE 40 lbs, LUE 60 lbs Goal status: INITIAL  4.  Pt will perform all basic ADLS with supervision Baseline: min-mod A Goal status: INITIAL  5.  Pt will perform light home management with supervision. Baseline: dependent Goal status: INITIAL  6.  Pt will improve Quick Dash core to 72% or better disability Baseline: 77% Goal status: INITIAL  ASSESSMENT:  CLINICAL IMPRESSION:Pt is progressing towards goals. He demonstrates improved fine motor coordination   PERFORMANCE DEFICITS: in functional skills including ADLs, IADLs, coordination, dexterity, proprioception, sensation, tone, ROM, strength, pain, flexibility, Fine motor control, Gross motor control, mobility, balance, endurance, decreased knowledge of precautions, decreased knowledge of use of DME, and UE functional use, cognitive skills including attention, problem solving, safety awareness, thought, and understand, and psychosocial skills including coping strategies, environmental adaptation, habits, interpersonal interactions, and routines and behaviors.   IMPAIRMENTS: are limiting patient from ADLs, IADLs, rest and sleep, work, play, leisure, and social participation.   CO-MORBIDITIES: may have co-morbidities  that affects occupational performance. Patient will benefit from skilled OT to address above impairments and improve overall function.  MODIFICATION OR ASSISTANCE TO COMPLETE EVALUATION: No modification of tasks or assist necessary to complete an evaluation.  OT OCCUPATIONAL PROFILE AND HISTORY: Detailed assessment: Review of records and additional review of physical, cognitive, psychosocial history related  to current functional performance.  CLINICAL DECISION MAKING: LOW - limited treatment options, no task modification necessary  REHAB POTENTIAL: Good  EVALUATION COMPLEXITY: Low    PLAN:  OT FREQUENCY: 1x/week plus eval  OT DURATION:11 weeks  PLANNED INTERVENTIONS: 97168 OT Re-evaluation, 97535 self care/ADL training, 29562 therapeutic exercise, 97530 therapeutic activity, 97112 neuromuscular re-education, 97140 manual therapy, 97035 ultrasound, 97018 paraffin, 13086 moist heat, 97010 cryotherapy, 97034 contrast bath, 97129 Cognitive training (first 15 min), 57846 Cognitive training(each additional 15 min), 96295 Orthotics management and training, 28413 Splinting (initial encounter), passive range of motion, balance training, functional mobility training, visual/perceptual remediation/compensation, energy conservation, coping strategies training, patient/family education, and DME and/or AE instructions  RECOMMENDED OTHER SERVICES: PT, ST  CONSULTED AND  AGREED WITH PLAN OF CARE: Patient and family member/caregiver  PLAN FOR NEXT SESSION: check short term goals , ADL strategies    Elisandro Jarrett, OT 12/16/2023, 12:36 PM

## 2023-12-17 ENCOUNTER — Ambulatory Visit: Payer: BLUE CROSS/BLUE SHIELD | Admitting: Orthopedic Surgery

## 2023-12-17 ENCOUNTER — Telehealth: Payer: Self-pay

## 2023-12-17 NOTE — Telephone Encounter (Signed)
Called patient to schedule follow up appointment, no answer. Left HIPAA compliant voicemail requesting callback.  ? ?Sandie Ano, RN ? ?

## 2023-12-23 ENCOUNTER — Other Ambulatory Visit: Payer: Self-pay

## 2023-12-24 ENCOUNTER — Telehealth: Payer: Self-pay | Admitting: Neurology

## 2023-12-24 NOTE — Telephone Encounter (Signed)
 LVM and sent MyChart msg informing pt of r/s needed for 8/14 appt- Maralyn Sago out.

## 2023-12-26 ENCOUNTER — Other Ambulatory Visit: Payer: Self-pay

## 2023-12-29 ENCOUNTER — Other Ambulatory Visit: Payer: Self-pay

## 2023-12-31 ENCOUNTER — Encounter (HOSPITAL_COMMUNITY): Payer: Self-pay

## 2023-12-31 ENCOUNTER — Ambulatory Visit: Payer: Self-pay

## 2023-12-31 ENCOUNTER — Emergency Department (HOSPITAL_COMMUNITY)
Admission: EM | Admit: 2023-12-31 | Discharge: 2023-12-31 | Discharge status: Against Medical Advice | Attending: Emergency Medicine | Admitting: Emergency Medicine

## 2023-12-31 ENCOUNTER — Emergency Department (HOSPITAL_COMMUNITY)

## 2023-12-31 ENCOUNTER — Other Ambulatory Visit: Payer: Self-pay

## 2023-12-31 DIAGNOSIS — I2510 Atherosclerotic heart disease of native coronary artery without angina pectoris: Secondary | ICD-10-CM | POA: Insufficient documentation

## 2023-12-31 DIAGNOSIS — Z7982 Long term (current) use of aspirin: Secondary | ICD-10-CM | POA: Diagnosis not present

## 2023-12-31 DIAGNOSIS — Z5329 Procedure and treatment not carried out because of patient's decision for other reasons: Secondary | ICD-10-CM | POA: Diagnosis not present

## 2023-12-31 DIAGNOSIS — Z79899 Other long term (current) drug therapy: Secondary | ICD-10-CM | POA: Diagnosis not present

## 2023-12-31 DIAGNOSIS — R739 Hyperglycemia, unspecified: Secondary | ICD-10-CM | POA: Diagnosis not present

## 2023-12-31 DIAGNOSIS — M25512 Pain in left shoulder: Secondary | ICD-10-CM | POA: Diagnosis not present

## 2023-12-31 DIAGNOSIS — I10 Essential (primary) hypertension: Secondary | ICD-10-CM | POA: Diagnosis not present

## 2023-12-31 DIAGNOSIS — G459 Transient cerebral ischemic attack, unspecified: Secondary | ICD-10-CM | POA: Diagnosis not present

## 2023-12-31 LAB — CBC
HCT: 35.5 % — ABNORMAL LOW (ref 39.0–52.0)
Hemoglobin: 12.2 g/dL — ABNORMAL LOW (ref 13.0–17.0)
MCH: 32 pg (ref 26.0–34.0)
MCHC: 34.4 g/dL (ref 30.0–36.0)
MCV: 93.2 fL (ref 80.0–100.0)
Platelets: 289 10*3/uL (ref 150–400)
RBC: 3.81 MIL/uL — ABNORMAL LOW (ref 4.22–5.81)
RDW: 12 % (ref 11.5–15.5)
WBC: 7.2 10*3/uL (ref 4.0–10.5)
nRBC: 0 % (ref 0.0–0.2)

## 2023-12-31 LAB — BASIC METABOLIC PANEL
Anion gap: 9 (ref 5–15)
BUN: 16 mg/dL (ref 6–20)
CO2: 24 mmol/L (ref 22–32)
Calcium: 9.2 mg/dL (ref 8.9–10.3)
Chloride: 100 mmol/L (ref 98–111)
Creatinine, Ser: 0.94 mg/dL (ref 0.61–1.24)
GFR, Estimated: >60 mL/min (ref >60)
Glucose, Bld: 277 mg/dL — ABNORMAL HIGH (ref 70–99)
Potassium: 4 mmol/L (ref 3.5–5.1)
Sodium: 133 mmol/L — ABNORMAL LOW (ref 135–145)

## 2023-12-31 LAB — TROPONIN I (HIGH SENSITIVITY): Troponin I (High Sensitivity): 18 ng/L — ABNORMAL HIGH (ref <18)

## 2023-12-31 NOTE — Telephone Encounter (Signed)
 Noted.  Patient is at the hospital now. Dm/cma

## 2023-12-31 NOTE — ED Provider Notes (Signed)
 [place] EMERGENCY DEPARTMENT AT [facility] Provider Note   CSN: [id number] Arrival date & time: 12/31/23  1244     History  Chief Complaint  Patient presents with   Shoulder Pain    Bruce Yang is a 53 y.o. male.  Past medical history significant for CVA, CAD, prediabetes, and marijuana use presents today for left shoulder pain that woke him up early this morning.  Patient states he contacted his PCP who advised he come to the ED for workup.  Patient denies arm pain, chest pain, shortness of breath, nausea, vomiting, fever, weakness, numbness, abdominal pain, or any other complaints at this time.   Shoulder Pain      Home Medications Prior to Admission medications   Medication Sig Start Date End Date Taking? Authorizing Provider  acetaminophen (TYLENOL) 500 MG tablet Take 1-2 tablets (500-1,000 mg total) by mouth every 6 (six) hours as needed for mild pain (pain score 1-3) or moderate pain (pain score 4-6). 10/15/23   Hongalgi, Maximino Greenland, MD  amLODipine-benazepril (LOTREL) 5-20 MG capsule Take 1 capsule by mouth daily. 11/19/23   Loyola Mast, MD  aspirin EC 81 MG tablet Take 1 tablet (81 mg total) by mouth daily. Swallow whole. 10/16/23   Hongalgi, Maximino Greenland, MD  atorvastatin (LIPITOR) 80 MG tablet Take 1 tablet (80 mg total) by mouth daily. 10/16/23   Hongalgi, Maximino Greenland, MD  clopidogrel (PLAVIX) 75 MG tablet Take 1 tablet (75 mg total) by mouth daily. 11/13/23   Glean Salvo, NP  dolutegravir-lamiVUDine (DOVATO) 50-300 MG tablet Take 1 tablet by mouth daily. 10/27/23   Randall Hiss, MD  folic acid (FOLVITE) 1 MG tablet Take 1 tablet (1 mg total) by mouth daily. 10/16/23   Hongalgi, Maximino Greenland, MD  Multiple Vitamin (MULTIVITAMIN WITH MINERALS) TABS tablet Take 1 tablet by mouth daily. 10/15/23   Hongalgi, Maximino Greenland, MD  tadalafil (CIALIS) 10 MG tablet Take 1 tablet (10 mg total) by mouth daily as needed for erectile dysfunction. 11/19/23   Loyola Mast, MD  thiamine  (VITAMIN B1) 100 MG tablet Take 1 tablet (100 mg total) by mouth daily. 10/16/23   Hongalgi, Maximino Greenland, MD  varenicline (CHANTIX) 1 MG tablet Take 1 tablet (1 mg total) by mouth 2 (two) times daily. 11/19/23   Loyola Mast, MD      Allergies    Antihistamines, chlorpheniramine-type    Review of Systems   Review of Systems  Musculoskeletal:  Positive for arthralgias.    Physical Exam Updated Vital Signs BP 131/74   Pulse 73   Temp 98.3 F (36.8 C) (Oral)   Resp 16   Ht 5\' 6"  (1.676 m)   Wt 72.6 kg   SpO2 96%   BMI 25.83 kg/m  Physical Exam Vitals and nursing note reviewed.  Constitutional:      General: He is not in acute distress.    Appearance: Normal appearance. He is well-developed. He is not ill-appearing, toxic-appearing or diaphoretic.  HENT:     Head: Normocephalic and atraumatic.     Right Ear: External ear normal.     Left Ear: External ear normal.  Eyes:     Conjunctiva/sclera: Conjunctivae normal.  Cardiovascular:     Rate and Rhythm: Normal rate and regular rhythm.     Pulses: Normal pulses.     Heart sounds: Normal heart sounds. No murmur heard. Pulmonary:     Effort: Pulmonary effort is normal. No respiratory distress.  Breath sounds: Normal breath sounds.  Abdominal:     Palpations: Abdomen is soft.     Tenderness: There is no abdominal tenderness.  Musculoskeletal:        General: No swelling.     Cervical back: Neck supple.  Skin:    General: Skin is warm and dry.     Capillary Refill: Capillary refill takes less than 2 seconds.  Neurological:     General: No focal deficit present.     Mental Status: He is alert and oriented to person, place, and time.     Motor: No weakness.  Psychiatric:        Mood and Affect: Mood normal.     ED Results / Procedures / Treatments   Labs (all labs ordered are listed, but only abnormal results are displayed) Labs Reviewed  BASIC METABOLIC PANEL - Abnormal; Notable for the following components:       Result Value   Sodium 133 (*)    Glucose, Bld 277 (*)    All other components within normal limits  CBC - Abnormal; Notable for the following components:   RBC 3.81 (*)    Hemoglobin 12.2 (*)    HCT 35.5 (*)    All other components within normal limits  TROPONIN I (HIGH SENSITIVITY) - Abnormal; Notable for the following components:   Troponin I (High Sensitivity) 18 (*)    All other components within normal limits  TROPONIN I (HIGH SENSITIVITY)    EKG None  Radiology DG Chest 2 View Result Date: 12/31/2023 CLINICAL DATA:  Chest pain EXAM: CHEST - 2 VIEW COMPARISON:  Chest radiograph 03/13/2008 FINDINGS: The heart size and mediastinal contours are within normal limits. Both lungs are clear. The visualized skeletal structures are unremarkable. IMPRESSION: No active cardiopulmonary disease. Electronically Signed   By: Annia Belt M.D.   On: 12/31/2023 14:21    Procedures Procedures    Medications Ordered in ED Medications - No data to display  ED Course/ Medical Decision Making/ A&P                                 Medical Decision Making Amount and/or Complexity of Data Reviewed Labs: ordered. Radiology: ordered.   This patient presents to the ED with chief complaint(s) of left shoulder pain with pertinent past medical history of CVA, CAD which further complicates the presenting complaint. The complaint involves an extensive differential diagnosis and also carries with it a high risk of complications and morbidity.    The differential diagnosis includes STEMI, NSTEMI, arrhythmia, electrolyte abnormality, musculoskeletal pain, fracture  Additional history obtained: Additional history obtained from spouse Records reviewed orthopedics progress note  ED Course and Reassessment:   Independent labs interpretation:  The following labs were independently interpreted:  CBC: Anemia which is chronic per historical values BMP: Mild hyponatremia 133, hyperglycemia 277 Troponin:  18 EKG: Normal sinus rhythm, ST and T wave abnormalities  Independent visualization of imaging: - I independently visualized the following imaging with scope of interpretation limited to determining acute life threatening conditions related to emergency care: Chest x-ray, which revealed no active cardiopulmonary disease  Consultation: - Consulted or discussed management/test interpretation w/ external professional: None  Patient wishes to leave AGAINST MEDICAL ADVICE. I personally explained need for further testing and my concerns for adverse outcomes if workup is incomplete. Specific concerns explained to patient include worsening symptoms, functional loss, long term sickness and death. Patient  states understanding of risks and states they will return if they feel the need to at a later date to receive the recommended care or any other care at any time, regardless of their ability to pay for such care. Patient understands they are able to return at any time. Patient is able to explain back the risks of leaving AMA and still wishes to leave.   Specifically I recommend delta troponin to evaluate and exclude NSTEMI.  The patient is oriented to person, place, and time, has the capacity to make decisions regarding the medical care offered. The patient speaks coherently and exhibits no evidence of having an altered level of consciousness or alcohol or drug intoxication to a point that would impair judgment. They respond knowingly to questions about recommended treatment and alternate treatments including no further testing or treatment; participate in diagnostic and treatment decisions by means of rational thought processes; and understand the items of minimum basic medical treatment information with respect to that treatment (the nature and seriousness of the illness, the nature of the treatment, the probable degree and duration of any benefits and risks of any medical intervention that is being  recommended, and the consequences of lack of treatment, and the nature, risks, and benefits of any reasonable alternatives).  The patient understands the relevant information of the nature of their medical condition, as well as the risks, benefits, and treatment alternatives (including non-treatment), consequences of refusing care, and can competently communicate a rational explanation about their choice of care options.    Included in AVS was the following message:  You have chosen to leave AGAINST MEDICAL ADVICE. Should you change your mind, you are always welcome and encouraged to return to the ED. You are encouraged to follow-up with, at the very least, a primary care provider, or other similar medical professional on this matter.         Final Clinical Impression(s) / ED Diagnoses Final diagnoses:  Acute pain of left shoulder    Rx / DC Orders ED Discharge Orders     None         Dolphus Jenny, PA-C 12/31/23 1520    Benjiman Core, MD 01/01/24 [phone]

## 2023-12-31 NOTE — Discharge Instructions (Signed)
 You have chosen to leave AGAINST MEDICAL ADVICE. Should you change your mind, you are always welcome and encouraged to return to the ED. You are encouraged to follow-up with, at the very least, a primary care provider, or other similar medical professional on this matter.

## 2023-12-31 NOTE — ED Notes (Signed)
Pt refusing blood draw for second troponin

## 2023-12-31 NOTE — ED Triage Notes (Signed)
 Pt arrived via [facility] from home. Pt's girlfriend called pt's PCP and told them pt states left shoulder pain woke him up early this morning and PCP said for pt to come to hosp. Pt denies chest pain, N/V or SOB

## 2023-12-31 NOTE — Telephone Encounter (Signed)
 Chief Complaint: arm pain Symptoms: L arm pain, L mouth pain, chest tightness Frequency: mouth pain since last week, arm pain since this AM, pt claims he always has chest tightness Pertinent Negatives: Patient denies SOB, N/V, dizziness Disposition: [x] ED /[] Urgent Care (no appt availability in office) / [] Appointment(In office/virtual)/ []  [facility]/ [] Home Care/ [] Refused Recommended Disposition /[] [place] [facility]/ []  Follow-up with PCP Additional Notes: Pt's fiance Rosita Fire called on behalf of the pt. Rosita Fire not at home with the pt at this time. Pt's cousin is at home with him.  Fiance states pt has had L-sided mouth pain for a week. She also states he developed L arm pain this AM. States the pain si 10/10 and pt is unable to lift that arm. States pt has a hx of stroke in January 2025 in which he has left-sided deficits. However, fiance states his R side is actually more weak. It is not typical for him to have L-sided arm pain and weakness after his stroke. Fiance states pt endorsed chest tightness when she asked him about chest pain. Fiance denied SOB, N/V, dizziness. Does endorse sometimes the pt gets extremely hot and a little sweaty at night but denies profuse diaphoresis. Fiance states she has asked the pt to go to the ED for his symptoms but he has not been wanting to do that. This RN advised the fiance the best place for the pt is the ED. Fiance called the pt and conferenced him into the call to see if he would be willing to go.  Pt agreeable to go to the ED and agreeable to have this RN call EMS to transport him there. RN found it difficult to assess the severity of the pt's symptoms. Pt states "it's not that bad" and "my arm is just kind of numb." Pt states he always has chest tightness.   RN called EMS and advised the pt needs to leave the door unlocked. Pt states he is in the living room by the door. Cousin is still with him. RN dropped off the call and left the pt and  his fiance on the call together.     Copied from CRM [id number]. Topic: Clinical - Red Word Triage >> Dec 31, 2023 11:15 AM Almira Coaster wrote: Red Word that prompted transfer to Nurse Triage: Patient's girlfriend is calling because patient has been experiencing severe left arm pain, she feels like it maybe signs of a heart attack other symptoms are pain in the mouth and pressure in the chest, patient suffered a stroke on 10/13/2023 which is why she believes it may lead to a heart attack. Reason for Disposition  [1] Chest pain lasts > 5 minutes AND [2] history of heart disease (i.e., angina, heart attack, heart failure, bypass surgery, takes nitroglycerin)  Answer Assessment - Initial Assessment Questions 1. LOCATION: "Where does it hurt?"       "Burning sensation in the L side of mouth, pain shoots down in his L arm, pt not able to move L arm (recent stroke with deficits to that side), states he cannot lift his arm and the pain is at his armpit and top of chest", chest tightness as well (not volunteering that information, significant other asked him about chest pain and pt he has a "shield over his heart" and a "tightness") 2. RADIATION: "Does the pain go anywhere else?" (e.g., into neck, jaw, arms, back)     L arm pain, mouth pain, and chest pain 3. ONSET: "When did  the chest pain begin?" (Minutes, hours or days)      Pain in the L arm last night/early AM, couldn't sleep, was tossing and turning. Mouth pain started last week. Denies pain in the mouth today. Pain is in the arm today. Pain in the mouth and the arm switches. He has been eating popsicles to help his mouth. 4. PATTERN: "Does the pain come and go, or has it been constant since it started?"  "Does it get worse with exertion?"      Comes and goes 5. DURATION: "How long does it last" (e.g., seconds, minutes, hours)     Hours - on and off  6. SEVERITY: "How bad is the pain?"  (e.g., Scale 1-10; mild, moderate, or severe)    - MILD (1-3):  doesn't interfere with normal activities     - MODERATE (4-7): interferes with normal activities or awakens from sleep    - SEVERE (8-10): excruciating pain, unable to do any normal activities       10/10 pain in his arm today - "his stroke effected his L side but he can lift it like normal, but the R arm is weaker than the left", with the pain he can't lift the L arm 7. CARDIAC RISK FACTORS: "Do you have any history of heart problems or risk factors for heart disease?" (e.g., angina, prior heart attack; diabetes, high blood pressure, high cholesterol, smoker, or strong family history of heart disease)     CVA in January, carotid artery disease, atherosclerosis, HTN 8. PULMONARY RISK FACTORS: "Do you have any history of lung disease?"  (e.g., blood clots in lung, asthma, emphysema, birth control pills)     No 9. CAUSE: "What do you think is causing the chest pain?"     Not sure  10. OTHER SYMPTOMS: "Do you have any other symptoms?" (e.g., dizziness, nausea, vomiting, sweating, fever, difficulty breathing, cough)       Had a stroke in 2024 they didn't know about. Had a stroke in January 2025 with L-sided deficits but the significant other states he is actually more weak on the R side. Lately his L arm has been hurting. Unable to lift it. Denies dizziness, denies lightheadedness, denies N/V, denies SOB. Significant other states pt has been getting "extremely hot" lately, then after a couple of hours he will feel cold. Significant other denies severe diaphoresis but states he might get a little sweaty and she can feel it on her skin. Pt endorses chest tightness but states "it is always like that."  Protocols used: Chest Pain-A-AH

## 2024-01-06 ENCOUNTER — Other Ambulatory Visit: Payer: Self-pay

## 2024-01-06 ENCOUNTER — Other Ambulatory Visit (HOSPITAL_COMMUNITY): Payer: Self-pay

## 2024-01-06 ENCOUNTER — Encounter: Payer: Self-pay | Admitting: Family Medicine

## 2024-01-09 ENCOUNTER — Other Ambulatory Visit (HOSPITAL_COMMUNITY): Payer: Self-pay

## 2024-01-09 ENCOUNTER — Other Ambulatory Visit: Payer: Self-pay

## 2024-01-09 NOTE — Progress Notes (Signed)
 Specialty Pharmacy Ongoing Clinical Assessment Note  Bruce Yang is a 53 y.o. male who is being followed by the specialty pharmacy service for RxSp HIV   Patient's specialty medication(s) reviewed today: Dolutegravir-lamiVUDine (Dovato)   Missed doses in the last 4 weeks: 0   Patient/Caregiver did not have any additional questions or concerns.   Therapeutic benefit summary: Patient is achieving benefit   Adverse events/side effects summary: No adverse events/side effects   Patient's therapy is appropriate to: Continue    Goals Addressed             This Visit's Progress    Achieve Undetectable HIV Viral Load < 20   Improving    Patient is initiating therapy. Patient will work on increased adherence.      Improve or maintain quality of life   On track    Patient is initiating therapy. Patient will be monitored by provider to determine if a change in treatment plan is warranted.      Maintain optimal adherence to therapy   On track    Patient is initiating therapy. Patient will work on increased adherence.         Follow up:  3 months  Otto Herb Specialty Pharmacist

## 2024-01-09 NOTE — Progress Notes (Signed)
 Specialty Pharmacy Refill Coordination Note  Bruce Yang is a 53 y.o. male contacted today regarding refills of specialty medication(s) Dolutegravir-lamiVUDine (Dovato)   Patient requested Bruce Yang at [facility] at [place] date: 01/09/24   Medication will be filled on 01/09/24.

## 2024-01-10 ENCOUNTER — Other Ambulatory Visit (HOSPITAL_COMMUNITY): Payer: Self-pay

## 2024-01-14 ENCOUNTER — Ambulatory Visit: Admitting: Orthopedic Surgery

## 2024-01-16 ENCOUNTER — Ambulatory Visit: Payer: BLUE CROSS/BLUE SHIELD | Admitting: Family Medicine

## 2024-01-16 ENCOUNTER — Telehealth: Payer: Self-pay | Admitting: Family Medicine

## 2024-01-16 NOTE — Telephone Encounter (Signed)
 Form placed on providers desk to be filled out.  Dm/cma

## 2024-01-16 NOTE — Telephone Encounter (Signed)
 Patient dropped off document Handicap Placard, to be filled out by provider. Patient requested to send it back via Call Patient to pick up within 7-days. Document is located in providers tray at front office.Please advise at Mobile [phone] (mobile)   Pt dropped off paperwork . I put in the dr box

## 2024-01-17 DIAGNOSIS — Z419 Encounter for procedure for purposes other than remedying health state, unspecified: Secondary | ICD-10-CM | POA: Diagnosis not present

## 2024-01-19 ENCOUNTER — Ambulatory Visit: Admitting: Orthopedic Surgery

## 2024-01-19 DIAGNOSIS — Z0279 Encounter for issue of other medical certificate: Secondary | ICD-10-CM

## 2024-01-19 NOTE — Telephone Encounter (Signed)
 Form filled out and pt notified and will come by to pick this up in the office.  Placed up front. Dm/cma

## 2024-01-25 ENCOUNTER — Encounter (HOSPITAL_BASED_OUTPATIENT_CLINIC_OR_DEPARTMENT_OTHER): Payer: Self-pay | Admitting: Emergency Medicine

## 2024-01-25 ENCOUNTER — Emergency Department (HOSPITAL_BASED_OUTPATIENT_CLINIC_OR_DEPARTMENT_OTHER)
Admission: EM | Admit: 2024-01-25 | Discharge: 2024-01-25 | Disposition: A | Discharge status: Home / Self Care | Attending: Emergency Medicine | Admitting: Emergency Medicine

## 2024-01-25 DIAGNOSIS — Z7901 Long term (current) use of anticoagulants: Secondary | ICD-10-CM | POA: Diagnosis not present

## 2024-01-25 DIAGNOSIS — Z21 Asymptomatic human immunodeficiency virus [HIV] infection status: Secondary | ICD-10-CM | POA: Insufficient documentation

## 2024-01-25 DIAGNOSIS — Z8673 Personal history of transient ischemic attack (TIA), and cerebral infarction without residual deficits: Secondary | ICD-10-CM | POA: Diagnosis not present

## 2024-01-25 DIAGNOSIS — L02412 Cutaneous abscess of left axilla: Secondary | ICD-10-CM | POA: Diagnosis not present

## 2024-01-25 DIAGNOSIS — L89219 Pressure ulcer of right hip, unspecified stage: Secondary | ICD-10-CM | POA: Diagnosis not present

## 2024-01-25 DIAGNOSIS — L03115 Cellulitis of right lower limb: Secondary | ICD-10-CM | POA: Insufficient documentation

## 2024-01-25 DIAGNOSIS — E1165 Type 2 diabetes mellitus with hyperglycemia: Secondary | ICD-10-CM | POA: Diagnosis not present

## 2024-01-25 DIAGNOSIS — L02415 Cutaneous abscess of right lower limb: Secondary | ICD-10-CM | POA: Diagnosis not present

## 2024-01-25 DIAGNOSIS — Z7982 Long term (current) use of aspirin: Secondary | ICD-10-CM | POA: Insufficient documentation

## 2024-01-25 DIAGNOSIS — B20 Human immunodeficiency virus [HIV] disease: Secondary | ICD-10-CM | POA: Diagnosis present

## 2024-01-25 DIAGNOSIS — R739 Hyperglycemia, unspecified: Secondary | ICD-10-CM | POA: Insufficient documentation

## 2024-01-25 DIAGNOSIS — K146 Glossodynia: Secondary | ICD-10-CM | POA: Diagnosis not present

## 2024-01-25 HISTORY — DX: Human immunodeficiency virus (HIV) disease: B20

## 2024-01-25 LAB — CBC WITH DIFFERENTIAL/PLATELET
Abs Immature Granulocytes: 0.04 10*3/uL (ref 0.00–0.07)
Basophils Absolute: 0 10*3/uL (ref 0.0–0.1)
Basophils Relative: 0 %
Eosinophils Absolute: 0 10*3/uL (ref 0.0–0.5)
Eosinophils Relative: 0 %
HCT: 34.4 % — ABNORMAL LOW (ref 39.0–52.0)
Hemoglobin: 12 g/dL — ABNORMAL LOW (ref 13.0–17.0)
Immature Granulocytes: 1 %
Lymphocytes Relative: 17 %
Lymphs Abs: 1.2 10*3/uL (ref 0.7–4.0)
MCH: 31.3 pg (ref 26.0–34.0)
MCHC: 34.9 g/dL (ref 30.0–36.0)
MCV: 89.8 fL (ref 80.0–100.0)
Monocytes Absolute: 0.6 10*3/uL (ref 0.1–1.0)
Monocytes Relative: 9 %
Neutro Abs: 5.3 10*3/uL (ref 1.7–7.7)
Neutrophils Relative %: 73 %
Platelets: 271 10*3/uL (ref 150–400)
RBC: 3.83 MIL/uL — ABNORMAL LOW (ref 4.22–5.81)
RDW: 11.5 % (ref 11.5–15.5)
WBC: 7.2 10*3/uL (ref 4.0–10.5)
nRBC: 0 % (ref 0.0–0.2)

## 2024-01-25 LAB — BASIC METABOLIC PANEL WITH GFR
Anion gap: 8 (ref 5–15)
BUN: 12 mg/dL (ref 6–20)
CO2: 26 mmol/L (ref 22–32)
Calcium: 10 mg/dL (ref 8.9–10.3)
Chloride: 94 mmol/L — ABNORMAL LOW (ref 98–111)
Creatinine, Ser: 0.88 mg/dL (ref 0.61–1.24)
GFR, Estimated: >60 mL/min (ref >60)
Glucose, Bld: 424 mg/dL — ABNORMAL HIGH (ref 70–99)
Potassium: 4.3 mmol/L (ref 3.5–5.1)
Sodium: 128 mmol/L — ABNORMAL LOW (ref 135–145)

## 2024-01-25 LAB — LACTIC ACID, PLASMA: Lactic Acid, Venous: 1 mmol/L (ref 0.5–1.9)

## 2024-01-25 MED ORDER — DOXYCYCLINE HYCLATE 100 MG PO TABS
100.0000 mg | ORAL_TABLET | Freq: Once | ORAL | Status: AC
  Administered 2024-01-25: 100 mg via ORAL
  Filled 2024-01-25: qty 1

## 2024-01-25 MED ORDER — DOXYCYCLINE HYCLATE 100 MG PO CAPS
100.0000 mg | ORAL_CAPSULE | Freq: Two times a day (BID) | ORAL | 0 refills | Status: DC
Start: 2024-01-25 — End: 2024-05-20

## 2024-01-25 MED ORDER — METFORMIN HCL 500 MG PO TABS: 500.0000 mg | ORAL_TABLET | Freq: Two times a day (BID) | ORAL | 1 refills | Status: DC

## 2024-01-25 NOTE — Discharge Instructions (Addendum)
 You were seen in the emergency department for wound on your right hip.  Your ultrasound did not show a definite abscess to incise.  Your blood sugar was elevated and you had a low-grade fever here.  We are starting you on antibiotics.  Please closely follow-up with your primary care doctor and them also giving you the phone number for wound clinic because she may benefit from their input.

## 2024-01-25 NOTE — ED Triage Notes (Signed)
 Abscess under left arm and on right hip Seen at [facility] and sent for eval of wound on right hip. X 3 weeks

## 2024-01-25 NOTE — ED Provider Notes (Signed)
 [place] EMERGENCY DEPARTMENT AT [facility] Provider Note   CSN: [id number] Arrival date & time: 01/25/24  1609     History {Add pertinent medical, surgical, social history, OB history to HPI:1} Chief Complaint  Patient presents with   Abscess    Bruce Yang is a 53 y.o. male.  He has a history of HIV and recently had a stroke.  He has been in the bed a lot putting pressure on his right hip although it sounds like he is more ambulatory now.  He was seen in urgent care today for an abscess on his left arm and right hip.  Urgent care I&D the left arm but they sent him here for further evaluation of the left hip.  He said it has been there about 3 weeks and getting progressively worse.  He said he felt unwell today with some fever and malaise.  Decreased appetite.  He is also complaining of some sores on the roof of his mouth.  The history is provided by the patient.  Abscess Location:  Pelvis Pelvic abscess location:  R hip Size:  5 Abscess quality: induration, painful and redness   Duration:  3 weeks Progression:  Worsening Pain details:    Quality:  Throbbing   Severity:  Moderate   Timing:  Constant   Progression:  Unchanged Chronicity:  New Context: immunosuppression   Relieved by:  None tried Associated symptoms: fatigue and fever   Risk factors: prior abscess        Home Medications Prior to Admission medications   Medication Sig Start Date End Date Taking? Authorizing Provider  acetaminophen  (TYLENOL ) 500 MG tablet Take 1-2 tablets (500-1,000 mg total) by mouth every 6 (six) hours as needed for mild pain (pain score 1-3) or moderate pain (pain score 4-6). 10/15/23   Hongalgi, Anand D, MD  amLODipine -benazepril  (LOTREL) 5-20 MG capsule Take 1 capsule by mouth daily. 11/19/23   Graig Lawyer, MD  aspirin  EC 81 MG tablet Take 1 tablet (81 mg total) by mouth daily. Swallow whole. 10/16/23   Hongalgi, Anand D, MD  atorvastatin  (LIPITOR ) 80 MG tablet Take 1  tablet (80 mg total) by mouth daily. 10/16/23   Hongalgi, Anand D, MD  clopidogrel  (PLAVIX ) 75 MG tablet Take 1 tablet (75 mg total) by mouth daily. 11/13/23   Wess Hammed, NP  dolutegravir -lamiVUDine  (DOVATO ) 50-300 MG tablet Take 1 tablet by mouth daily. 10/27/23   Charolette Copier, MD  folic acid  (FOLVITE ) 1 MG tablet Take 1 tablet (1 mg total) by mouth daily. 10/16/23   Hongalgi, Anand D, MD  Multiple Vitamin (MULTIVITAMIN WITH MINERALS) TABS tablet Take 1 tablet by mouth daily. 10/15/23   Hongalgi, Anand D, MD  tadalafil  (CIALIS ) 10 MG tablet Take 1 tablet (10 mg total) by mouth daily as needed for erectile dysfunction. 11/19/23   Graig Lawyer, MD  thiamine  (VITAMIN B1) 100 MG tablet Take 1 tablet (100 mg total) by mouth daily. 10/16/23   Hongalgi, Anand D, MD  varenicline  (CHANTIX ) 1 MG tablet Take 1 tablet (1 mg total) by mouth 2 (two) times daily. 11/19/23   Graig Lawyer, MD      Allergies    Antihistamines, chlorpheniramine-type    Review of Systems   Review of Systems  Constitutional:  Positive for fatigue and fever.    Physical Exam Updated Vital Signs BP (!) 173/80   Pulse 80   Temp (!) 100.6 F (38.1 C) (Oral)   Resp  18   SpO2 96%  Physical Exam Vitals and nursing note reviewed.  Constitutional:      General: He is not in acute distress.    Appearance: Normal appearance. He is well-developed.  HENT:     Head: Normocephalic and atraumatic.     Mouth/Throat:     Mouth: Mucous membranes are moist.     Pharynx: Oropharynx is clear. No oropharyngeal exudate or posterior oropharyngeal erythema.     Comments: No lesions identified on oral inspection Eyes:     Conjunctiva/sclera: Conjunctivae normal.  Cardiovascular:     Rate and Rhythm: Normal rate and regular rhythm.     Heart sounds: No murmur heard. Pulmonary:     Effort: Pulmonary effort is normal. No respiratory distress.     Breath sounds: Normal breath sounds.  Abdominal:     Palpations: Abdomen is soft.      Tenderness: There is no abdominal tenderness.  Musculoskeletal:        General: Tenderness present.     Cervical back: Neck supple.     Comments: Right hip has a soft tissue indurated painful area with some superficial purulence but no fluctuance.  Skin:    General: Skin is warm and dry.     Capillary Refill: Capillary refill takes less than 2 seconds.  Neurological:     Mental Status: He is alert and oriented to person, place, and time.     ED Results / Procedures / Treatments   Labs (all labs ordered are listed, but only abnormal results are displayed) Labs Reviewed  CULTURE, BLOOD (ROUTINE X 2)  CULTURE, BLOOD (ROUTINE X 2)  AEROBIC CULTURE W GRAM STAIN (SUPERFICIAL SPECIMEN)  BASIC METABOLIC PANEL WITH GFR  CBC WITH DIFFERENTIAL/PLATELET  LACTIC ACID, PLASMA  LACTIC ACID, PLASMA    EKG None  Radiology No results found.  Procedures .Ultrasound ED Soft Tissue  Date/Time: 01/25/2024 5:06 PM  Performed by: Tonya Fredrickson, MD Authorized by: Tonya Fredrickson, MD   Procedure details:    Indications: localization of abscess and evaluate for cellulitis     Transverse view:  Visualized   Longitudinal view:  Visualized   Images: archived   Location:    Location: lower extremity     Side:  Right Findings:     no abscess present    cellulitis present    no foreign body present   {Document cardiac monitor, telemetry assessment procedure when appropriate:1}  Medications Ordered in ED Medications - No data to display  ED Course/ Medical Decision Making/ A&P   {   Click here for ABCD2, HEART and other calculatorsREFRESH Note before signing :1}                              Medical Decision Making Amount and/or Complexity of Data Reviewed Labs: ordered.   This patient complains of ***; this involves an extensive number of treatment Options and is a complaint that carries with it a high risk of complications and morbidity. The differential includes ***  I  ordered, reviewed and interpreted labs, which included *** I ordered medication *** and reviewed PMP when indicated. I ordered imaging studies which included *** and I independently    visualized and interpreted imaging which showed *** Additional history obtained from *** Previous records obtained and reviewed *** I consulted *** and discussed lab and imaging findings and discussed disposition.  Cardiac monitoring reviewed, *** Social determinants considered, ***  Critical Interventions: ***  After the interventions stated above, I reevaluated the patient and found *** Admission and further testing considered, ***   {Document critical care time when appropriate:1} {Document review of labs and clinical decision tools ie heart score, Chads2Vasc2 etc:1}  {Document your independent review of radiology images, and any outside records:1} {Document your discussion with family members, caretakers, and with consultants:1} {Document social determinants of health affecting pt's care:1} {Document your decision making why or why not admission, treatments were needed:1} Final Clinical Impression(s) / ED Diagnoses Final diagnoses:  None    Rx / DC Orders ED Discharge Orders     None

## 2024-01-27 ENCOUNTER — Other Ambulatory Visit: Payer: Self-pay

## 2024-01-28 LAB — AEROBIC CULTURE W GRAM STAIN (SUPERFICIAL SPECIMEN): Special Requests: NORMAL

## 2024-01-30 LAB — CULTURE, BLOOD (ROUTINE X 2)
Culture: NO GROWTH
Special Requests: ADEQUATE

## 2024-02-02 ENCOUNTER — Other Ambulatory Visit (HOSPITAL_COMMUNITY): Payer: Self-pay

## 2024-02-02 ENCOUNTER — Ambulatory Visit (INDEPENDENT_AMBULATORY_CARE_PROVIDER_SITE_OTHER): Admitting: Family Medicine

## 2024-02-02 ENCOUNTER — Other Ambulatory Visit: Payer: Self-pay | Admitting: Family Medicine

## 2024-02-02 ENCOUNTER — Ambulatory Visit: Payer: Self-pay | Admitting: Family Medicine

## 2024-02-02 ENCOUNTER — Encounter (HOSPITAL_BASED_OUTPATIENT_CLINIC_OR_DEPARTMENT_OTHER): Attending: General Surgery | Admitting: General Surgery

## 2024-02-02 ENCOUNTER — Encounter: Payer: Self-pay | Admitting: Family Medicine

## 2024-02-02 DIAGNOSIS — L97112 Non-pressure chronic ulcer of right thigh with fat layer exposed: Secondary | ICD-10-CM | POA: Diagnosis not present

## 2024-02-02 DIAGNOSIS — E11622 Type 2 diabetes mellitus with other skin ulcer: Secondary | ICD-10-CM | POA: Insufficient documentation

## 2024-02-02 DIAGNOSIS — B20 Human immunodeficiency virus [HIV] disease: Secondary | ICD-10-CM | POA: Insufficient documentation

## 2024-02-02 DIAGNOSIS — Z794 Long term (current) use of insulin: Secondary | ICD-10-CM

## 2024-02-02 DIAGNOSIS — F1910 Other psychoactive substance abuse, uncomplicated: Secondary | ICD-10-CM | POA: Insufficient documentation

## 2024-02-02 DIAGNOSIS — E1159 Type 2 diabetes mellitus with other circulatory complications: Secondary | ICD-10-CM | POA: Insufficient documentation

## 2024-02-02 DIAGNOSIS — R739 Hyperglycemia, unspecified: Secondary | ICD-10-CM

## 2024-02-02 DIAGNOSIS — E1165 Type 2 diabetes mellitus with hyperglycemia: Secondary | ICD-10-CM | POA: Diagnosis not present

## 2024-02-02 DIAGNOSIS — L03115 Cellulitis of right lower limb: Secondary | ICD-10-CM | POA: Diagnosis not present

## 2024-02-02 LAB — POCT GLYCOSYLATED HEMOGLOBIN (HGB A1C): Hemoglobin A1C: 12.8 % — AB (ref 4.0–5.6)

## 2024-02-02 LAB — GLUCOSE, POCT (MANUAL RESULT ENTRY): POC Glucose: 320 mg/dL — AB (ref 70–99)

## 2024-02-02 MED ORDER — ATORVASTATIN CALCIUM 80 MG PO TABS
80.0000 mg | ORAL_TABLET | Freq: Every day | ORAL | 0 refills | Status: DC
Start: 2024-02-02 — End: 2024-02-12
  Filled 2024-02-02: qty 90, 90d supply, fill #0

## 2024-02-02 MED ORDER — DEXCOM G6 SENSOR MISC: 5 refills | Status: DC

## 2024-02-02 MED ORDER — INSULIN GLARGINE-YFGN 100 UNIT/ML ~~LOC~~ SOLN: SUBCUTANEOUS | 11 refills | Status: DC

## 2024-02-02 NOTE — Progress Notes (Unsigned)
 Established Patient Office Visit okay   Subjective:  Patient ID: Bruce Yang, male    DOB: 1971-09-08  Age: 53 y.o. MRN: [id number]  Chief Complaint  Patient presents with   Diabetes    Pt complains of left eye blurriness, excessive thirst, and frequent urination.      Diabetes Pertinent negatives for diabetes include no blurred vision, no polydipsia and no weakness.   Encounter Diagnoses  Name Primary?   Hyperglycemia Yes   Uncontrolled type 2 diabetes mellitus with hyperglycemia (HCC)    For follow-up of the emergency room visit on 4/20 for cellulitis of right thigh.  He has been seen by wound care and follow-up as planned next week.  He continues with doxycycline  and daily dressing changes.  He is accompanied by his significant other.  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  He maintains compliance with his HIV medication, Dovato .  A1c was measured at 6.3 back in November.  He had been started on metformin  but discontinued it after the first day secondary to diarrhea.  Accompanied by his significant other, [name] today. {History (Optional):23778}  Review of Systems  Constitutional: Negative.   HENT: Negative.    Eyes:  Negative for blurred vision, discharge and redness.  Respiratory: Negative.    Cardiovascular: Negative.   Gastrointestinal:  Negative for abdominal pain.  Genitourinary: Negative.   Musculoskeletal: Negative.  Negative for myalgias.  Skin:  Negative for rash.  Neurological:  Negative for tingling, loss of consciousness and weakness.  Endo/Heme/Allergies:  Negative for polydipsia.     Current Outpatient Medications:    acetaminophen  (TYLENOL ) 500 MG tablet, Take 1-2 tablets (500-1,000 mg total) by mouth every 6 (six) hours as needed for mild pain (pain score 1-3) or moderate pain (pain score 4-6)., Disp: , Rfl:    amLODipine -benazepril  (LOTREL) 5-20 MG capsule, Take 1 capsule by mouth daily., Disp: 90  capsule, Rfl: 3   aspirin  EC 81 MG tablet, Take 1 tablet (81 mg total) by mouth daily. Swallow whole., Disp: 90 tablet, Rfl: 0   atorvastatin  (LIPITOR ) 80 MG tablet, Take 1 tablet (80 mg total) by mouth daily., Disp: 90 tablet, Rfl: 0   clopidogrel  (PLAVIX ) 75 MG tablet, Take 1 tablet (75 mg total) by mouth daily., Disp: 30 tablet, Rfl: 1   Continuous Glucose Sensor (DEXCOM G6 SENSOR) MISC, As directed., Disp: 1 each, Rfl: 5   dolutegravir -lamiVUDine  (DOVATO ) 50-300 MG tablet, Take 1 tablet by mouth daily., Disp: 30 tablet, Rfl: 11   doxycycline  (VIBRAMYCIN ) 100 MG capsule, Take 1 capsule (100 mg total) by mouth 2 (two) times daily., Disp: 20 capsule, Rfl: 0   folic acid  (FOLVITE ) 1 MG tablet, Take 1 tablet (1 mg total) by mouth daily., Disp: 30 tablet, Rfl: 1   insulin  glargine-yfgn (SEMGLEE) 100 UNIT/ML injection, Start with 12 units tonight., Disp: 10 mL, Rfl: 11   metFORMIN  (GLUCOPHAGE ) 500 MG tablet, Take 1 tablet (500 mg total) by mouth 2 (two) times daily with a meal., Disp: 60 tablet, Rfl: 1   Multiple Vitamin (MULTIVITAMIN WITH MINERALS) TABS tablet, Take 1 tablet by mouth daily., Disp: , Rfl:    tadalafil  (CIALIS ) 10 MG tablet, Take 1 tablet (10 mg total) by mouth daily as needed for erectile dysfunction., Disp: 30 tablet, Rfl: 3   thiamine  (VITAMIN B1) 100 MG tablet, Take 1 tablet (100 mg total) by mouth daily., Disp: 30 tablet, Rfl: 1   varenicline  (CHANTIX ) 1 MG tablet, Take 1  tablet (1 mg total) by mouth 2 (two) times daily., Disp: 60 tablet, Rfl: 5   Objective:     There were no vitals taken for this visit. {Vitals History (Optional):23777}  Physical Exam Constitutional:      General: He is not in acute distress.    Appearance: Normal appearance. He is not ill-appearing, toxic-appearing or diaphoretic.  HENT:     Head: Normocephalic and atraumatic.     Right Ear: External ear normal.     Left Ear: External ear normal.  Eyes:     General: No scleral icterus.       Right  eye: No discharge.        Left eye: No discharge.     Extraocular Movements: Extraocular movements intact.     Conjunctiva/sclera: Conjunctivae normal.  Pulmonary:     Effort: Pulmonary effort is normal. No respiratory distress.  Skin:    General: Skin is warm and dry.  Neurological:     Mental Status: He is alert and oriented to person, place, and time.  Psychiatric:        Mood and Affect: Mood normal.        Behavior: Behavior normal.      Results for orders placed or performed in visit on 02/02/24  POCT glycosylated hemoglobin (Hb A1C)  Result Value Ref Range   Hemoglobin A1C 12.8 (A) 4.0 - 5.6 %   HbA1c POC (<> result, manual entry)     HbA1c, POC (prediabetic range)     HbA1c, POC (controlled diabetic range)    POCT glucose (manual entry)  Result Value Ref Range   POC Glucose 320 (A) 70 - 99 mg/dl    {Labs (OZHYQMVH):84696}  The ASCVD Risk score (Arnett DK, et al., 2019) failed to calculate for the following reasons:   Risk score cannot be calculated because patient has a medical history suggesting prior/existing ASCVD    Assessment & Plan:   Hyperglycemia -     POCT glycosylated hemoglobin (Hb A1C) -     POCT glucose (manual entry) -     Insulin  Glargine-yfgn; Start with 12 units tonight.  Dispense: 10 mL; Refill: 11 -     Dexcom G6 Sensor; As directed.  Dispense: 1 each; Refill: 5  Uncontrolled type 2 diabetes mellitus with hyperglycemia (HCC) -     Insulin  Glargine-yfgn; Start with 12 units tonight.  Dispense: 10 mL; Refill: 11 -     Dexcom G6 Sensor; As directed.  Dispense: 1 each; Refill: 5    Return Schedule follow-up with Dr. Therese Flash in 2 to 3 weeks.Bruce Yang  POCT A1c was measured at 12.8 today.  Will start glargine 12 units tonight.  Increase by 4 units nightly until fasting sugars are in the less than 130 but greater than 90 range.  Instructions were given to the patient and his significant other and they repeated them back to me.  Continue follow-up with wound  care as directed.  Advised him that the diarrhea seen with metformin  often passes.  He refuses to take it.  [name], MD

## 2024-02-02 NOTE — Telephone Encounter (Signed)
 Chief Complaint: Elevated blood glucose  Symptoms: Constantly urinating, sweet smell in urine and sticky, nausea, weakness Pertinent Negatives: Patient denies difficulty breathing, dizziness, vomiting, rapid breathing  Disposition:  [x] Appointment (In office)  Additional Notes: Spoke with pt's girlfriend, Earvin Goldberg. On 4/20, pt went to ED for a boil on his arm and wound on his right hip. The boil was removed. In the ED, pt found out his blood sugar was > 400. Pt was prescribed metformin  which pt does not like due to the frequent bowel movements. Pt stopped taking metformin  after one dose. Pt had an appt today with wound care and they were concerned due to his frequent thirst. Pt girlfriend states she is not able to check pt blood glucose right now. Pt scheduled for an appt today at PCP office with a different provider. This RN educated pt girlfriend on new-worsening symptoms and when to call back/seek emergent care. Pt girlfriend verbalized understanding and agrees to plan.      Copied from CRM [id number]. Topic: Clinical - Red Word Triage >> Feb 02, 2024  9:38 AM Alyse July wrote: Red Word that prompted transfer to Nurse Triage: High Blood Sugar (400) Reason for Disposition  [1] Symptoms of high blood sugar (e.g., abnormally thirsty, frequent urination, weight loss) AND [2] not able to test blood glucose  Answer Assessment - Initial Assessment Questions BLOOD GLUCOSE: "What is your blood glucose level?"      Not able to check right now;" blood sugar will not go under 100" ONSET: "When did you check the blood glucose?"     Saturday, 446 USUAL RANGE: "What is your glucose level usually?" (e.g., usual fasting morning value, usual evening value)     Not sure, just started checking it TYPE 1 or 2:  "Do you know what type of diabetes you have?"  (e.g., Type 1, Type 2, Gestational; doesn't know)      Not officially diagnosed INSULIN : "Do you take insulin ?" "What type of insulin (s) do you use? What  is the mode of delivery? (syringe, pen; injection or pump)?"      Denies DIABETES PILLS: "Do you take any pills for your diabetes?" If Yes, ask: "Have you missed taking any pills recently?"     Metformin  was prescribed a week ago, pt only took one dose and stopped OTHER SYMPTOMS: "Do you have any symptoms?" (e.g., fever, frequent urination, difficulty breathing, dizziness, weakness, vomiting)     Frequent urination, nausea, weakness  Protocols used: Diabetes - High Blood Sugar-A-AH

## 2024-02-02 NOTE — Telephone Encounter (Signed)
 Noted. Patient scheduled to see Dr. Tilmon Font in office today.

## 2024-02-03 ENCOUNTER — Telehealth: Payer: Self-pay

## 2024-02-03 ENCOUNTER — Other Ambulatory Visit (HOSPITAL_COMMUNITY): Payer: Self-pay

## 2024-02-03 DIAGNOSIS — E1165 Type 2 diabetes mellitus with hyperglycemia: Secondary | ICD-10-CM

## 2024-02-03 MED ORDER — LANTUS SOLOSTAR 100 UNIT/ML ~~LOC~~ SOPN
12.0000 [IU] | PEN_INJECTOR | Freq: Every day | SUBCUTANEOUS | 3 refills | Status: DC
Start: 2024-02-03 — End: 2024-02-25

## 2024-02-03 NOTE — Telephone Encounter (Signed)
 Hey can we start/submit prior authorization for medications listed below

## 2024-02-03 NOTE — Telephone Encounter (Signed)
 Pharmacy Patient Advocate Encounter   Received notification from Pt Calls Messages that prior authorization for Semglee (insulin  glargine-yfgn)  is required/requested.   Insurance verification completed.   The patient is insured through [facility] [place] .   Per test claim:  Lantus (insulin  glargine) is preferred by the insurance.  If suggested medication is appropriate, Please send in a new RX and discontinue this one. If not, please advise as to why it's not appropriate so that we may request a Prior Authorization. Please note, some preferred medications may still require a PA.  If the suggested medications have not been trialed and there are no contraindications to their use, the PA will not be submitted, as it will not be approved.

## 2024-02-03 NOTE — Addendum Note (Signed)
 Addended by: Kasandra Pain B on: 02/03/2024 02:05 PM   Modules accepted: Orders

## 2024-02-03 NOTE — Telephone Encounter (Signed)
 Pharmacy Patient Advocate Encounter   Received notification from Pt Calls Messages that prior authorization for Dexcom G6 Sensor is required/requested.   Insurance verification completed.   The patient is insured through [facility] [place] .   Per test claim: PA required; PA submitted to above mentioned insurance via CoverMyMeds Key/confirmation #/EOC [id number] Status is pending

## 2024-02-03 NOTE — Telephone Encounter (Signed)
 Pharmacy Patient Advocate Encounter  Received notification from [facility] Medicaid that Prior Authorization for Dexcom G6 Sensor has been APPROVED from 01-20-2024 to 08-01-2024   PA #/Case ID/Reference #: [id number]

## 2024-02-03 NOTE — Telephone Encounter (Signed)
 Copied from CRM [id number]. Topic: Clinical - Prescription Issue >> Feb 03, 2024  8:30 AM Kita Perish H wrote: Reason for CRM: Patients fiance is calling stating that the pharmacy [facility] on file needs a prior authorization for the Continuous Glucose Sensor (DEXCOM G6 SENSOR) and the insulin  glargine-yfgn (SEMGLEE) 100 UNIT/ML injection  Leila Punt [phone]

## 2024-02-04 ENCOUNTER — Telehealth: Payer: Self-pay | Admitting: Family Medicine

## 2024-02-04 ENCOUNTER — Ambulatory Visit: Admitting: Internal Medicine

## 2024-02-04 ENCOUNTER — Other Ambulatory Visit (HOSPITAL_COMMUNITY): Payer: Self-pay

## 2024-02-04 DIAGNOSIS — E1165 Type 2 diabetes mellitus with hyperglycemia: Secondary | ICD-10-CM

## 2024-02-04 NOTE — Telephone Encounter (Signed)
 Because you are doc of the day, can you help with this? He has been trying tto get this filled. They were at the pharmacy and still can't get it.  insulin  glargine-yfgn (SEMGLEE) 100 UNIT/ML injection [[id number]]  DISCONTINUED   Bruce Yang is on DPR [phone] Please call to let her know what is going on.

## 2024-02-04 NOTE — Telephone Encounter (Signed)
 Returned call to Bruce Yang who is on DPR on file. I informed her that the [facility] insulin  was not approved by her husband's insurance and that the preferred insulin  is Lantus. I also let her know that the Lantus Solostar insulin  was at the [facility] ready for pick up. I reviewed the instruction of injection 12 units into skin daily. She asked why not 2 times a day or at night like Dr. Tilmon Font ordered the last medication. Placed on a brief hold and spoke with DOD Kathrene Parents, NP. She reviewed chart and instructed me to let patient know that the insulin  is the same medication just a different name. [name] was informed that [name] stated that this is all new to her and that she doesn't know what to look for as in what is considered high and/or fr his blood sugars and how to use the Dexcom. She asked that I ask if she wanted a referral for diabetic education and  a nurse visit for education for the Dexcom. She is agreeable to both the referral and the nurse visit. Referral placed for nutrition and diabetes and patient will call me back when she gets the Dexcom to schedule a nurse visit.

## 2024-02-05 ENCOUNTER — Other Ambulatory Visit: Payer: Self-pay

## 2024-02-05 ENCOUNTER — Other Ambulatory Visit (HOSPITAL_BASED_OUTPATIENT_CLINIC_OR_DEPARTMENT_OTHER): Payer: Self-pay

## 2024-02-05 ENCOUNTER — Emergency Department (HOSPITAL_BASED_OUTPATIENT_CLINIC_OR_DEPARTMENT_OTHER)
Admission: EM | Admit: 2024-02-05 | Discharge: 2024-02-05 | Disposition: A | Discharge status: Home / Self Care | Attending: Emergency Medicine | Admitting: Emergency Medicine

## 2024-02-05 ENCOUNTER — Encounter (HOSPITAL_BASED_OUTPATIENT_CLINIC_OR_DEPARTMENT_OTHER): Payer: Self-pay | Admitting: Emergency Medicine

## 2024-02-05 DIAGNOSIS — E1165 Type 2 diabetes mellitus with hyperglycemia: Secondary | ICD-10-CM | POA: Diagnosis not present

## 2024-02-05 DIAGNOSIS — E860 Dehydration: Secondary | ICD-10-CM | POA: Insufficient documentation

## 2024-02-05 DIAGNOSIS — Z7901 Long term (current) use of anticoagulants: Secondary | ICD-10-CM | POA: Insufficient documentation

## 2024-02-05 DIAGNOSIS — Z794 Long term (current) use of insulin: Secondary | ICD-10-CM | POA: Insufficient documentation

## 2024-02-05 DIAGNOSIS — Z7982 Long term (current) use of aspirin: Secondary | ICD-10-CM | POA: Insufficient documentation

## 2024-02-05 DIAGNOSIS — R3589 Other polyuria: Secondary | ICD-10-CM | POA: Diagnosis not present

## 2024-02-05 DIAGNOSIS — Z7984 Long term (current) use of oral hypoglycemic drugs: Secondary | ICD-10-CM | POA: Diagnosis not present

## 2024-02-05 DIAGNOSIS — R739 Hyperglycemia, unspecified: Secondary | ICD-10-CM | POA: Diagnosis present

## 2024-02-05 LAB — URINALYSIS, ROUTINE W REFLEX MICROSCOPIC
Bacteria, UA: NONE SEEN
Bilirubin Urine: NEGATIVE
Glucose, UA: >1000 mg/dL — AB
Hgb urine dipstick: NEGATIVE
Ketones, ur: NEGATIVE mg/dL
Leukocytes,Ua: NEGATIVE
Nitrite: NEGATIVE
Protein, ur: NEGATIVE mg/dL
Specific Gravity, Urine: 1.022 (ref 1.005–1.030)
pH: 5.5 (ref 5.0–8.0)

## 2024-02-05 LAB — COMPREHENSIVE METABOLIC PANEL WITH GFR
ALT: 15 U/L (ref 0–44)
AST: 24 U/L (ref 15–41)
Albumin: 4.2 g/dL (ref 3.5–5.0)
Alkaline Phosphatase: 110 U/L (ref 38–126)
Anion gap: 10 (ref 5–15)
BUN: 19 mg/dL (ref 6–20)
CO2: 22 mmol/L (ref 22–32)
Calcium: 9.9 mg/dL (ref 8.9–10.3)
Chloride: 100 mmol/L (ref 98–111)
Creatinine, Ser: 0.94 mg/dL (ref 0.61–1.24)
GFR, Estimated: >60 mL/min (ref >60)
Glucose, Bld: 311 mg/dL — ABNORMAL HIGH (ref 70–99)
Potassium: 5.4 mmol/L — ABNORMAL HIGH (ref 3.5–5.1)
Sodium: 132 mmol/L — ABNORMAL LOW (ref 135–145)
Total Bilirubin: 0.3 mg/dL (ref 0.0–1.2)
Total Protein: 7.2 g/dL (ref 6.5–8.1)

## 2024-02-05 LAB — I-STAT VENOUS BLOOD GAS, ED
Acid-Base Excess: 2 mmol/L (ref 0.0–2.0)
Bicarbonate: 24 mmol/L (ref 20.0–28.0)
Calcium, Ion: 1.07 mmol/L — ABNORMAL LOW (ref 1.15–1.40)
HCT: 34 % — ABNORMAL LOW (ref 39.0–52.0)
Hemoglobin: 11.6 g/dL — ABNORMAL LOW (ref 13.0–17.0)
O2 Saturation: 94 %
Potassium: 5.3 mmol/L — ABNORMAL HIGH (ref 3.5–5.1)
Sodium: 133 mmol/L — ABNORMAL LOW (ref 135–145)
TCO2: 25 mmol/L (ref 22–32)
pCO2, Ven: 29.7 mmHg — ABNORMAL LOW (ref 44–60)
pH, Ven: 7.515 — ABNORMAL HIGH (ref 7.25–7.43)
pO2, Ven: 62 mmHg — ABNORMAL HIGH (ref 32–45)

## 2024-02-05 LAB — CBG MONITORING, ED
Glucose-Capillary: 320 mg/dL — ABNORMAL HIGH (ref 70–99)
Glucose-Capillary: 267 mg/dL — ABNORMAL HIGH (ref 70–99)

## 2024-02-05 LAB — CBC WITH DIFFERENTIAL/PLATELET
Abs Immature Granulocytes: 0.07 10*3/uL (ref 0.00–0.07)
Basophils Absolute: 0 10*3/uL (ref 0.0–0.1)
Basophils Relative: 0 %
Eosinophils Absolute: 0.1 10*3/uL (ref 0.0–0.5)
Eosinophils Relative: 1 %
HCT: 32.7 % — ABNORMAL LOW (ref 39.0–52.0)
Hemoglobin: 11.4 g/dL — ABNORMAL LOW (ref 13.0–17.0)
Immature Granulocytes: 1 %
Lymphocytes Relative: 22 %
Lymphs Abs: 1.2 10*3/uL (ref 0.7–4.0)
MCH: 31.6 pg (ref 26.0–34.0)
MCHC: 34.9 g/dL (ref 30.0–36.0)
MCV: 90.6 fL (ref 80.0–100.0)
Monocytes Absolute: 0.4 10*3/uL (ref 0.1–1.0)
Monocytes Relative: 8 %
Neutro Abs: 3.8 10*3/uL (ref 1.7–7.7)
Neutrophils Relative %: 68 %
Platelets: 336 10*3/uL (ref 150–400)
RBC: 3.61 MIL/uL — ABNORMAL LOW (ref 4.22–5.81)
RDW: 11.9 % (ref 11.5–15.5)
WBC: 5.6 10*3/uL (ref 4.0–10.5)
nRBC: 0 % (ref 0.0–0.2)

## 2024-02-05 LAB — BETA-HYDROXYBUTYRIC ACID: Beta-Hydroxybutyric Acid: 0.23 mmol/L (ref 0.05–0.27)

## 2024-02-05 MED ORDER — SODIUM CHLORIDE 0.9 % IV BOLUS
1000.0000 mL | Freq: Once | INTRAVENOUS | Status: AC
  Administered 2024-02-05: 1000 mL via INTRAVENOUS

## 2024-02-05 MED ORDER — INSUPEN PEN NEEDLES 32G X 4 MM MISC
0 refills | Status: AC
  Filled 2024-02-05: qty 100, 90d supply, fill #0

## 2024-02-05 MED ORDER — INSULIN GLARGINE 100 UNIT/ML SOLOSTAR PEN
12.0000 [IU] | PEN_INJECTOR | Freq: Every day | SUBCUTANEOUS | 0 refills | Status: DC
  Filled 2024-02-05: qty 9, 75d supply, fill #0
  Filled 2024-04-01: qty 6, 50d supply, fill #1

## 2024-02-05 NOTE — ED Provider Notes (Cosign Needed)
 [place] EMERGENCY DEPARTMENT AT [facility] Provider Note   CSN: [id number] Arrival date & time: 02/05/24  1020     History  Chief Complaint  Patient presents with   Hyperglycemia    Bruce Yang is a 53 y.o. male.  Patient a 53 year old male here for elevated blood sugar and increased fatigue.  Patient reports he went to his primary care provider several days ago and was told that he had type 2 diabetes.  Was sent insulin  to his pharmacy.  Was unable to obtain insulin  at his pharmacy yesterday.  This morning had increase fatigue and weakness which is why he came in today.  He has had increased thirst and urinary frequency throughout this.  Has also noticed some weight loss in the past several months.  He does have nausea but has not had significant vomiting.   Hyperglycemia Severity:  Moderate Onset quality:  Gradual Duration:  3 months Timing:  Constant Progression:  Worsening Chronicity:  New Current diabetic therapy:  Was instructed to start insulin  Ineffective treatments:  None tried Associated symptoms: dehydration, increased thirst, nausea and polyuria   Associated symptoms: no altered mental status, no dysuria, no fever and no increased appetite        Home Medications Prior to Admission medications   Medication Sig Start Date End Date Taking? Authorizing Provider  insulin  glargine (LANTUS ) 100 UNIT/ML Solostar Pen Inject 12 Units into the skin daily. 02/05/24  Yes Ivin Marrow, MD  acetaminophen  (TYLENOL ) 500 MG tablet Take 1-2 tablets (500-1,000 mg total) by mouth every 6 (six) hours as needed for mild pain (pain score 1-3) or moderate pain (pain score 4-6). 10/15/23   Hongalgi, Anand D, MD  amLODipine -benazepril  (LOTREL) 5-20 MG capsule Take 1 capsule by mouth daily. 11/19/23   Graig Lawyer, MD  aspirin  EC 81 MG tablet Take 1 tablet (81 mg total) by mouth daily. Swallow whole. 10/16/23   Hongalgi, Anand D, MD  atorvastatin  (LIPITOR ) 80 MG tablet  Take 1 tablet (80 mg total) by mouth daily. 02/02/24   Webb, Padonda B, FNP  clopidogrel  (PLAVIX ) 75 MG tablet Take 1 tablet (75 mg total) by mouth daily. 11/13/23   Wess Hammed, NP  Continuous Glucose Sensor (DEXCOM G6 SENSOR) MISC As directed. 02/02/24   [name], MD  dolutegravir -lamiVUDine  (DOVATO ) 50-300 MG tablet Take 1 tablet by mouth daily. 10/27/23   Charolette Copier, MD  doxycycline  (VIBRAMYCIN ) 100 MG capsule Take 1 capsule (100 mg total) by mouth 2 (two) times daily. 01/25/24   Tonya Fredrickson, MD  folic acid  (FOLVITE ) 1 MG tablet Take 1 tablet (1 mg total) by mouth daily. 10/16/23   Hongalgi, Anand D, MD  insulin  glargine (LANTUS  SOLOSTAR) 100 UNIT/ML Solostar Pen Inject 12 Units into the skin daily. 02/03/24 01/28/25  Catheryn Cluck, MD  metFORMIN  (GLUCOPHAGE ) 500 MG tablet Take 1 tablet (500 mg total) by mouth 2 (two) times daily with a meal. 01/25/24   Tonya Fredrickson, MD  Multiple Vitamin (MULTIVITAMIN WITH MINERALS) TABS tablet Take 1 tablet by mouth daily. 10/15/23   Hongalgi, Anand D, MD  tadalafil  (CIALIS ) 10 MG tablet Take 1 tablet (10 mg total) by mouth daily as needed for erectile dysfunction. 11/19/23   Graig Lawyer, MD  thiamine  (VITAMIN B1) 100 MG tablet Take 1 tablet (100 mg total) by mouth daily. 10/16/23   Hongalgi, Anand D, MD  varenicline  (CHANTIX ) 1 MG tablet Take 1 tablet (1 mg total) by mouth  2 (two) times daily. 11/19/23   Graig Lawyer, MD      Allergies    Antihistamines, chlorpheniramine-type    Review of Systems   Review of Systems  Constitutional:  Negative for fever.  Gastrointestinal:  Positive for nausea.  Endocrine: Positive for polydipsia and polyuria.  Genitourinary:  Negative for dysuria.    Physical Exam Updated Vital Signs BP (!) 141/80 (BP Location: Left Arm)   Pulse 74   Temp (!) 97.4 F (36.3 C)   Resp 16   SpO2 99%  Physical Exam Vitals reviewed.  Constitutional:      General: He is not in acute distress.     Appearance: Normal appearance. He is ill-appearing. He is not toxic-appearing or diaphoretic.  HENT:     Head: Normocephalic.     Nose: Nose normal.     Mouth/Throat:     Mouth: Mucous membranes are dry.  Eyes:     Extraocular Movements: Extraocular movements intact.  Cardiovascular:     Rate and Rhythm: Normal rate and regular rhythm.     Pulses: Normal pulses.     Heart sounds: Normal heart sounds.  Pulmonary:     Effort: Pulmonary effort is normal. No respiratory distress.     Breath sounds: Normal breath sounds.  Abdominal:     General: Abdomen is flat.     Palpations: Abdomen is soft.     Tenderness: There is no abdominal tenderness. There is no guarding.  Musculoskeletal:     Right lower leg: No edema.     Left lower leg: No edema.  Skin:    General: Skin is warm and dry.     Coloration: Skin is not jaundiced.  Neurological:     General: No focal deficit present.     Mental Status: He is alert.  Psychiatric:        Mood and Affect: Mood normal.     ED Results / Procedures / Treatments   Labs (all labs ordered are listed, but only abnormal results are displayed) Labs Reviewed  CBC WITH DIFFERENTIAL/PLATELET - Abnormal; Notable for the following components:      Result Value   RBC 3.61 (*)    Hemoglobin 11.4 (*)    HCT 32.7 (*)    All other components within normal limits  COMPREHENSIVE METABOLIC PANEL WITH GFR - Abnormal; Notable for the following components:   Sodium 132 (*)    Potassium 5.4 (*)    Glucose, Bld 311 (*)    All other components within normal limits  URINALYSIS, ROUTINE W REFLEX MICROSCOPIC - Abnormal; Notable for the following components:   Glucose, UA >1,000 (*)    All other components within normal limits  CBG MONITORING, ED - Abnormal; Notable for the following components:   Glucose-Capillary 320 (*)    All other components within normal limits  I-STAT VENOUS BLOOD GAS, ED - Abnormal; Notable for the following components:   pH, Ven 7.515  (*)    pCO2, Ven 29.7 (*)    pO2, Ven 62 (*)    Sodium 133 (*)    Potassium 5.3 (*)    Calcium , Ion 1.07 (*)    HCT 34.0 (*)    Hemoglobin 11.6 (*)    All other components within normal limits  CBG MONITORING, ED - Abnormal; Notable for the following components:   Glucose-Capillary 267 (*)    All other components within normal limits  BLOOD GAS, VENOUS  BETA-HYDROXYBUTYRIC ACID    EKG  EKG Interpretation Date/Time:  Thursday Feb 05 2024 12:28:21 EDT Ventricular Rate:  66 PR Interval:  142 QRS Duration:  92 QT Interval:  374 QTC Calculation: 392 R Axis:   68  Text Interpretation: Sinus rhythm t wave changes seen in prior Confirmed by Lowery Rue [phone]) on 02/05/2024 12:30:20 PM  Radiology No results found.  Procedures Procedures    Medications Ordered in ED Medications  sodium chloride  0.9 % bolus 1,000 mL (1,000 mLs Intravenous New Bag/Given 02/05/24 1122)    ED Course/ Medical Decision Making/ A&P                                 Medical Decision Making Patient is a 53 y.o. here with generalized weakness and fatigue in setting of known new onset diabetes. Patient has been unable to obtain initial insulin  yet. Symptoms are consistent with possible DKA, but more likely secondary to just dehydration. Will order CMP, CBC, VBG, EKG, BHB. Overall, exam appears reassuring with stable vitals and mild dehydration. Will administer 1L NS bolus.   Lab work notable for pH 7.515, K+ 5.4, but hemolyzed. EKG ordered, and reviewed. Unchanged from previous. Likely overall potassium down. Suspect alkalosis is secondary to chronic respiratory compensation chronic acidemia secondary to hyperglycemia.   Reevaluated at 12:40pm. Patient subjectively greatly improved after fluids bolus. Eating crackers comfortably. Discussed labs and recommended starting insulin  tonight. Recommended following up with PCP for further insulin  adjustments. Patient insulin  prescription of 12u of Lantus  sent to our  pharmacy for pick up today.  At this time there does not appear to be any evidence of an acute emergency medical condition and the patient appears stable for discharge with appropriate outpatient follow up. Diagnosis was discussed with patient who verbalizes understanding of care plan and is agreeable to discharge. I have discussed return precautions with patient and father who verbalizes understanding. Patient encouraged to follow-up with their PCP as soon as possible. All questions answered.    Amount and/or Complexity of Data Reviewed Labs: ordered.  Risk Prescription drug management.           Final Clinical Impression(s) / ED Diagnoses Final diagnoses:  Hyperglycemia due to diabetes mellitus (HCC)    Rx / DC Orders ED Discharge Orders          Ordered    insulin  glargine (LANTUS ) 100 UNIT/ML Solostar Pen  Daily        02/05/24 1237              Ivin Marrow, MD 02/05/24 1249

## 2024-02-05 NOTE — ED Triage Notes (Signed)
 States he has not had his insulin  since dx. C/o increased nausea.

## 2024-02-05 NOTE — ED Notes (Signed)
 Pts family member to pick up insulin  at pharmacy and return to pts room for education

## 2024-02-05 NOTE — ED Notes (Signed)
 Pt had difficulty with pharmacy getting his insulin ,tried for 2 days.

## 2024-02-05 NOTE — Discharge Instructions (Addendum)
 You came to the ED for symptoms of elevated blood sugar and dehydration. We have sent your insulin  prescription to the pharmacy here. Please pick it up and start taking your new insulin  prescription as directed by your primary care provider. Please call your primary care provider's clinic to clarify issue with obtaining previous insulin  prescription at your regular pharmacy. If you have new worsening abdominal pain nausea vomiting and are unable to keep liquids or solids down please return to care.

## 2024-02-05 NOTE — ED Notes (Signed)
 Pt and girlfriend able to return demonstration on insulin  pen.

## 2024-02-05 NOTE — ED Provider Notes (Signed)
 Patient here for elevated blood sugar.  Was started on Lantus  last week but was unable to get the medication today.  A1c was in the 12 range on check last week with his primary care doctor.  He was prescribed Lantus  but had difficulty picking it up at his pharmacy.  He is having some polyuria polydipsia but otherwise is well-appearing.  Blood sugar 311 today.  Electrolytes overall unremarkable otherwise.  Some hemolysis with his potassium is suspected to be lower.  Ultimately he is not in DKA.  He was given fluid bolus we have arranged for his Lantus  to be picked up at a different pharmacy.  He he has diabetes education set up for next week.  We talked about dietary changes as well.  We talked about exercise.  He has had a stroke in the past but he states he should be able to walk maybe 30 minutes a day will try to make change with that.  We talked about eliminating sugars in his diet the best as possible especially with his drinks.  Overall patient discharged in good condition.  Will follow-up with his primary care doctor.  This chart was dictated using voice recognition software.  Despite best efforts to proofread,  errors can occur which can change the documentation meaning.    Lowery Rue, DO 02/05/24 1226

## 2024-02-05 NOTE — ED Notes (Signed)
 RT Note: VBG completed

## 2024-02-05 NOTE — ED Notes (Signed)
 Unsuccessful attempt at iv , left ac

## 2024-02-09 ENCOUNTER — Encounter (HOSPITAL_BASED_OUTPATIENT_CLINIC_OR_DEPARTMENT_OTHER): Attending: General Surgery | Admitting: General Surgery

## 2024-02-09 ENCOUNTER — Other Ambulatory Visit (HOSPITAL_COMMUNITY): Payer: Self-pay

## 2024-02-09 DIAGNOSIS — E11622 Type 2 diabetes mellitus with other skin ulcer: Secondary | ICD-10-CM | POA: Insufficient documentation

## 2024-02-09 DIAGNOSIS — L97112 Non-pressure chronic ulcer of right thigh with fat layer exposed: Secondary | ICD-10-CM | POA: Insufficient documentation

## 2024-02-09 DIAGNOSIS — L02415 Cutaneous abscess of right lower limb: Secondary | ICD-10-CM | POA: Diagnosis not present

## 2024-02-10 ENCOUNTER — Other Ambulatory Visit (HOSPITAL_COMMUNITY): Payer: Self-pay

## 2024-02-10 ENCOUNTER — Telehealth: Payer: Self-pay

## 2024-02-10 DIAGNOSIS — R739 Hyperglycemia, unspecified: Secondary | ICD-10-CM

## 2024-02-10 MED ORDER — DEXCOM G6 TRANSMITTER MISC: 1.0000 [IU] | 3 refills | Status: DC

## 2024-02-10 NOTE — Telephone Encounter (Signed)
 Copied from CRM [id number]. Topic: Clinical - Medication Question >> Feb 10, 2024 10:39 AM Elita Guitar wrote: Reason for CRM: Leila Punt called back for an update regarding the dexcom 6. The crm was resolved but no encounter was created in chart. She asked for Tyra Galley to give her a callback. Please advise.

## 2024-02-10 NOTE — Telephone Encounter (Signed)
 Called pharmacy and patient did get the Dexcom.  Dm/cma

## 2024-02-10 NOTE — Telephone Encounter (Signed)
 Patient needs the dex com transmitter RX sent .  They have a smart phone but it needs a code that isn't on the sensor box.  Sent the RX to the pharmacy and patient's wife notified VIA phone to check with pharmacy later.  Dm/cma

## 2024-02-11 ENCOUNTER — Telehealth: Payer: Self-pay

## 2024-02-11 ENCOUNTER — Other Ambulatory Visit (HOSPITAL_COMMUNITY): Payer: Self-pay

## 2024-02-11 NOTE — Telephone Encounter (Signed)
 Pharmacy Patient Advocate Encounter   Received notification from Onbase that prior authorization for Dexcom G6 Transmitter is required/requested.   Insurance verification completed.   The patient is insured through [facility] [place] .   Per test claim: PA required; PA submitted to above mentioned insurance via CoverMyMeds Key/confirmation #/EOC [facility] Status is pending

## 2024-02-11 NOTE — Telephone Encounter (Signed)
 Pharmacy Patient Advocate Encounter  Received notification from [place] MEDICA[employer] that Prior Authorization for Dexcom G6 Transmitter has been APPROVED from 01/28/2024 to 08/09/2024   PA #/Case ID/Reference #: [id number]

## 2024-02-12 ENCOUNTER — Other Ambulatory Visit (HOSPITAL_COMMUNITY): Payer: Self-pay

## 2024-02-12 ENCOUNTER — Telehealth: Payer: Self-pay

## 2024-02-12 ENCOUNTER — Encounter: Payer: Self-pay | Admitting: Internal Medicine

## 2024-02-12 ENCOUNTER — Other Ambulatory Visit: Payer: Self-pay

## 2024-02-12 ENCOUNTER — Ambulatory Visit (INDEPENDENT_AMBULATORY_CARE_PROVIDER_SITE_OTHER): Admitting: Internal Medicine

## 2024-02-12 VITALS — BP 149/62 | HR 73 | Resp 16 | Ht 66.0 in | Wt 149.8 lb

## 2024-02-12 DIAGNOSIS — B20 Human immunodeficiency virus [HIV] disease: Secondary | ICD-10-CM | POA: Diagnosis not present

## 2024-02-12 DIAGNOSIS — Z1159 Encounter for screening for other viral diseases: Secondary | ICD-10-CM

## 2024-02-12 DIAGNOSIS — Z23 Encounter for immunization: Secondary | ICD-10-CM | POA: Diagnosis not present

## 2024-02-12 DIAGNOSIS — I10 Essential (primary) hypertension: Secondary | ICD-10-CM

## 2024-02-12 MED ORDER — ASPIRIN 81 MG PO TBEC
81.0000 mg | DELAYED_RELEASE_TABLET | Freq: Every day | ORAL | 3 refills | Status: DC
  Filled 2024-02-12: qty 30, 30d supply, fill #0

## 2024-02-12 MED ORDER — ATORVASTATIN CALCIUM 80 MG PO TABS
80.0000 mg | ORAL_TABLET | Freq: Every day | ORAL | 3 refills | Status: DC
  Filled 2024-02-12: qty 90, 90d supply, fill #0

## 2024-02-12 MED ORDER — AMLODIPINE BESY-BENAZEPRIL HCL 5-20 MG PO CAPS
1.0000 | ORAL_CAPSULE | Freq: Every day | ORAL | 3 refills | Status: DC
  Filled 2024-02-12: qty 90, 90d supply, fill #0

## 2024-02-12 MED ORDER — CLOPIDOGREL BISULFATE 75 MG PO TABS
75.0000 mg | ORAL_TABLET | Freq: Every day | ORAL | 3 refills | Status: DC
Start: 2024-02-12 — End: 2024-04-26
  Filled 2024-02-12: qty 90, 90d supply, fill #0

## 2024-02-12 NOTE — Progress Notes (Signed)
 Subjective:  Chief complaint follow-up for HIV disease on medications  Patient ID: Bruce Yang, male    DOB: [birth year], 53 y.o.   MRN: [id number]  HPI  Bruce Yang is a 53 year old man recently admitted the hospital with a stroke in the context of hypertension and substance abuse.  His HIV test was done as part of the algorithm for admission testing to the medicine service and this was positive.  I started him on Dovato  as an inpatient and make sure that he had testing for genotype resistance including integrates resistance as well as hepatitis B which she did not have.  He has tolerated the Dovato  without difficulty though his sleep-wake schedule seems a bit out of sorts recently he was concerned that his propionic might have been contributing to this and he had stopped this.  He also appears to have stopped his Plavix  due to his concerns about risk of bleeding.  His girlfriend accompanied him today so that she could be tested in our clinic as well for HIV  Past Medical History:  Diagnosis Date   HIV disease (HCC)    Hyperlipidemia    Hypertension    Recurrent boils of anus    Stroke [facility])     History reviewed. No pertinent surgical history.  Family History  Problem Relation Age of Onset   Other Mother        Homicide   Hypertension Maternal Aunt    Diabetes Maternal Aunt    Cancer Maternal Uncle        Multiple myeloma   Liver disease Neg Hx    Esophageal cancer Neg Hx    Colon cancer Neg Hx       Social History   Socioeconomic History   Marital status: Significant Other    Spouse name: Not on file   Number of children: 8   Years of education: Not on file   Highest education level: Associate degree: occupational, Scientist, product/process development, or vocational program  Occupational History   Occupation: Psychologist, occupational  Tobacco Use   Smoking status: Former    Types: Cigarettes    Start date: 01/22/2024    Quit date: 10/07/1981    Years since quitting: 42.3    Passive exposure: Never    Smokeless tobacco: Never   Tobacco comments:    Quit smoking 3 weeks ago-02/12/24  Vaping Use   Vaping status: Never Used  Substance and Sexual Activity   Alcohol use: Not Currently    Alcohol/week: 2.0 standard drinks of alcohol    Types: 2 Shots of liquor per week   Drug use: Not Currently    Frequency: 5.0 times per week    Types: Marijuana   Sexual activity: Yes    Partners: Female  Other Topics Concern   Not on file  Social History Narrative   Left handed   Caffeine- 1 cup   Not currently working    Social Drivers of Corporate investment banker Strain: Not on file  Food Insecurity: No Food Insecurity (10/14/2023)   Hunger Vital Sign    Worried About Running Out of Food in the Last Year: Never true    Ran Out of Food in the Last Year: Never true  Transportation Needs: Patient Declined (10/14/2023)   PRAPARE - Administrator, Civil Service (Medical): Patient declined    Lack of Transportation (Non-Medical): Patient declined  Physical Activity: Not on file  Stress: Not on file  Social Connections: Not on file  Allergies  Allergen Reactions   Antihistamines, Chlorpheniramine-Type     Affects Prostate      Current Outpatient Medications:    atorvastatin  (LIPITOR ) 80 MG tablet, Take 1 tablet (80 mg total) by mouth daily., Disp: 90 tablet, Rfl: 0   Continuous Glucose Sensor (DEXCOM G6 SENSOR) MISC, As directed., Disp: 1 each, Rfl: 5   Continuous Glucose Transmitter (DEXCOM G6 TRANSMITTER) MISC, 1 Units by Does not apply route every 14 (fourteen) days., Disp: 2 each, Rfl: 3   dolutegravir -lamiVUDine  (DOVATO ) 50-300 MG tablet, Take 1 tablet by mouth daily., Disp: 30 tablet, Rfl: 11   insulin  glargine (LANTUS  SOLOSTAR) 100 UNIT/ML Solostar Pen, Inject 12 Units into the skin daily., Disp: 15 mL, Rfl: 3   insulin  glargine (LANTUS ) 100 UNIT/ML Solostar Pen, Inject 12 Units into the skin daily., Disp: 15 mL, Rfl: 0   Multiple Vitamin (MULTIVITAMIN WITH MINERALS) TABS  tablet, Take 1 tablet by mouth daily., Disp: , Rfl:    thiamine  (VITAMIN B1) 100 MG tablet, Take 1 tablet (100 mg total) by mouth daily., Disp: 30 tablet, Rfl: 1   varenicline  (CHANTIX ) 1 MG tablet, Take 1 tablet (1 mg total) by mouth 2 (two) times daily., Disp: 60 tablet, Rfl: 5   acetaminophen  (TYLENOL ) 500 MG tablet, Take 1-2 tablets (500-1,000 mg total) by mouth every 6 (six) hours as needed for mild pain (pain score 1-3) or moderate pain (pain score 4-6). (Patient not taking: Reported on 02/12/2024), Disp: , Rfl:    amLODipine -benazepril  (LOTREL) 5-20 MG capsule, Take 1 capsule by mouth daily. (Patient not taking: Reported on 02/12/2024), Disp: 90 capsule, Rfl: 3   aspirin  EC 81 MG tablet, Take 1 tablet (81 mg total) by mouth daily. Swallow whole. (Patient not taking: Reported on 02/12/2024), Disp: 90 tablet, Rfl: 0   clopidogrel  (PLAVIX ) 75 MG tablet, Take 1 tablet (75 mg total) by mouth daily. (Patient not taking: Reported on 02/12/2024), Disp: 30 tablet, Rfl: 1   doxycycline  (VIBRAMYCIN ) 100 MG capsule, Take 1 capsule (100 mg total) by mouth 2 (two) times daily. (Patient not taking: Reported on 02/12/2024), Disp: 20 capsule, Rfl: 0   folic acid  (FOLVITE ) 1 MG tablet, Take 1 tablet (1 mg total) by mouth daily. (Patient not taking: Reported on 02/12/2024), Disp: 30 tablet, Rfl: 1   Insulin  Pen Needle (INSUPEN PEN NEEDLES) 32G X 4 MM MISC, Use once daily, Disp: 100 each, Rfl: 0   metFORMIN  (GLUCOPHAGE ) 500 MG tablet, Take 1 tablet (500 mg total) by mouth 2 (two) times daily with a meal. (Patient not taking: Reported on 02/12/2024), Disp: 60 tablet, Rfl: 1   tadalafil  (CIALIS ) 10 MG tablet, Take 1 tablet (10 mg total) by mouth daily as needed for erectile dysfunction. (Patient not taking: Reported on 02/12/2024), Disp: 30 tablet, Rfl: 3    Review of Systems  Constitutional:  Negative for activity change, appetite change, chills, diaphoresis, fatigue, fever and unexpected weight change.  HENT:  Negative for  congestion, rhinorrhea, sinus pressure, sneezing, sore throat and trouble swallowing.   Eyes:  Negative for photophobia and visual disturbance.  Respiratory:  Negative for cough, chest tightness, shortness of breath, wheezing and stridor.   Cardiovascular:  Negative for chest pain, palpitations and leg swelling.  Gastrointestinal:  Negative for abdominal distention, abdominal pain, anal bleeding, blood in stool, constipation, diarrhea, nausea and vomiting.  Genitourinary:  Negative for difficulty urinating, dysuria, flank pain and hematuria.  Musculoskeletal:  Negative for arthralgias, back pain, gait problem, joint swelling and myalgias.  Skin:  Negative for color change, pallor, rash and wound.  Neurological:  Negative for dizziness, tremors, weakness and light-headedness.  Hematological:  Negative for adenopathy. Does not bruise/bleed easily.  Psychiatric/Behavioral:  Negative for agitation, behavioral problems, confusion, decreased concentration, dysphoric mood, sleep disturbance and suicidal ideas.        Objective:   Physical Exam Constitutional:      Appearance: He is well-developed.  HENT:     Head: Normocephalic and atraumatic.  Eyes:     Conjunctiva/sclera: Conjunctivae normal.  Cardiovascular:     Rate and Rhythm: Normal rate and regular rhythm.  Pulmonary:     Effort: Pulmonary effort is normal. No respiratory distress.     Breath sounds: No wheezing.  Abdominal:     General: There is no distension.     Palpations: Abdomen is soft.  Musculoskeletal:        General: No tenderness. Normal range of motion.     Cervical back: Normal range of motion and neck supple.  Skin:    General: Skin is warm and dry.     Coloration: Skin is not pale.     Findings: No erythema or rash.  Neurological:     General: No focal deficit present.     Mental Status: He is alert and oriented to person, place, and time.  Psychiatric:        Mood and Affect: Mood normal.        Behavior:  Behavior normal.        Thought Content: Thought content normal.        Judgment: Judgment normal.           Assessment & Plan:   HIV disease:  I will recheck a viral load with reflex to genotype.  I did look in the [facility] to see what the results of his conventional genotype was but this was not back his integrase showed no integrase strand transfer inhibitor mutations.  We will continue Dovato .  We will check his girlfriend for HIV with a fourth-generation assay as well as an HIV RNA test  Substance abuse: He admits to marijuana use but says he has stopped smoking. 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  He supposed be taking Plavix  as well but not been taking this.  Needs to follow-up with neurology and primary care.  Former smoker he was on bupropion  but has stopped this due to concerns that his causing problems his sleep-wake schedule.  Vaccine counseling: Commended flu COVID and Prevnar today which he received    02/12/24 interested in [place]. Restarted dovato  recently 10/2023 testing showed no rilipivirine resistance. Explains we would like to see 2 good viral load before transitioning over but he would be a good candidate Abe Abed runs copay and zero copay required with his insurance No missed dose dovato  last 4  weeks  -discussed u=u -encourage compliance -continue current HIV medication dovato  -labs today  just viral load -f/u in 3 months for another load testing     #other medical problems -dm2 on insulin /metformin  -hyperlipidemia on atorvastatin  80 -htn no longer taking amlodipine -benazepril  (because he is out; will renew before he sees dr Therese Flash) -hx CVA 10/2023 and one more prior; no residual weakness; aspirin /plavix  -erectile dysfunction   #hcm -vaccination Shingrix today 02/12/24 -- second one next visit Tdap utd 06/2023 Prevnar 10/2023 Meningococcal today 02/2024 -hepatitis -metabolic -std screen Patient in monogamous relationship with 17 years Patient is still married with his current wife but trying to get divorced -tb screening -cancer screening Advise f/u with Dr Remo Carls for age appropriate cancer screening            Jamesetta Mcbride, MD [facility] [phone]  pager   [phone] cell 02/12/2024, 11:44 AM

## 2024-02-12 NOTE — Telephone Encounter (Signed)
 Called pharmacy, they state they received the RX for the Dexcom 6 transmitter, although its saying its not covered. They go they RX to go through and will order it and then send patient a text message to let him know when its ready for pick up.   Unable to leave VM on patients phone due to mailbox is full. Will try later. Dm/cma

## 2024-02-12 NOTE — Patient Instructions (Addendum)
 Continue dovato    Vaccine today Shingrix shot one; next shot 3 months Meningococcal   In 3 more months if viral load good will do injection    See me in 3 months    Blood pressure/cholesterol meds renewed   See you pcp as well for routine care

## 2024-02-12 NOTE — Telephone Encounter (Signed)
 Copied from CRM [id number]. Topic: Clinical - Prescription Issue >> Feb 11, 2024  3:25 PM Adaysia C wrote: Reason for CRM: Patients pharmacy is saying they still need approval to refill [id number] Glucose Sensor (DEXCOM G6 SENSOR) MISC by the PCP; please follow up with patients pharmacy to provide the approval  [id number] Jonette Nestle, [place] - [address] [place] [address] Phone: [phone]  Fax: [phone]  Please follow up with Mamie Searles after this request has been completed [phone]

## 2024-02-14 LAB — HEPATITIS B SURFACE ANTIBODY, QUANTITATIVE: Hep B S AB Quant (Post): <5 m[IU]/mL — ABNORMAL LOW (ref >=10)

## 2024-02-14 LAB — HEPATITIS B SURFACE ANTIGEN: Hepatitis B Surface Ag: NONREACTIVE

## 2024-02-14 LAB — HIV-1 RNA QUANT-NO REFLEX-BLD
HIV 1 RNA Quant: 33 {copies}/mL — ABNORMAL HIGH
HIV-1 RNA Quant, Log: 1.52 {Log_copies}/mL — ABNORMAL HIGH

## 2024-02-14 LAB — HEPATITIS C ANTIBODY: Hepatitis C Ab: NONREACTIVE

## 2024-02-14 LAB — HEPATITIS B CORE ANTIBODY, TOTAL: Hep B Core Total Ab: NONREACTIVE

## 2024-02-16 ENCOUNTER — Encounter (HOSPITAL_BASED_OUTPATIENT_CLINIC_OR_DEPARTMENT_OTHER): Admitting: Internal Medicine

## 2024-02-16 DIAGNOSIS — L02415 Cutaneous abscess of right lower limb: Secondary | ICD-10-CM | POA: Diagnosis not present

## 2024-02-16 DIAGNOSIS — Z419 Encounter for procedure for purposes other than remedying health state, unspecified: Secondary | ICD-10-CM | POA: Diagnosis not present

## 2024-02-16 DIAGNOSIS — E11622 Type 2 diabetes mellitus with other skin ulcer: Secondary | ICD-10-CM | POA: Diagnosis not present

## 2024-02-16 DIAGNOSIS — L97112 Non-pressure chronic ulcer of right thigh with fat layer exposed: Secondary | ICD-10-CM | POA: Diagnosis not present

## 2024-02-18 ENCOUNTER — Other Ambulatory Visit (HOSPITAL_BASED_OUTPATIENT_CLINIC_OR_DEPARTMENT_OTHER): Payer: Self-pay

## 2024-02-23 ENCOUNTER — Encounter (HOSPITAL_BASED_OUTPATIENT_CLINIC_OR_DEPARTMENT_OTHER): Admitting: General Surgery

## 2024-02-23 DIAGNOSIS — L02415 Cutaneous abscess of right lower limb: Secondary | ICD-10-CM | POA: Diagnosis not present

## 2024-02-23 DIAGNOSIS — L97112 Non-pressure chronic ulcer of right thigh with fat layer exposed: Secondary | ICD-10-CM | POA: Diagnosis not present

## 2024-02-23 DIAGNOSIS — E11622 Type 2 diabetes mellitus with other skin ulcer: Secondary | ICD-10-CM | POA: Diagnosis not present

## 2024-02-25 ENCOUNTER — Ambulatory Visit (INDEPENDENT_AMBULATORY_CARE_PROVIDER_SITE_OTHER): Admitting: Family Medicine

## 2024-02-25 ENCOUNTER — Other Ambulatory Visit: Payer: Self-pay | Admitting: Physician Assistant

## 2024-02-25 ENCOUNTER — Encounter: Payer: Self-pay | Admitting: Family Medicine

## 2024-02-25 VITALS — BP 130/70 | HR 86 | Temp 97.6°F

## 2024-02-25 DIAGNOSIS — E1165 Type 2 diabetes mellitus with hyperglycemia: Secondary | ICD-10-CM | POA: Diagnosis not present

## 2024-02-25 DIAGNOSIS — Z8673 Personal history of transient ischemic attack (TIA), and cerebral infarction without residual deficits: Secondary | ICD-10-CM

## 2024-02-25 DIAGNOSIS — E785 Hyperlipidemia, unspecified: Secondary | ICD-10-CM | POA: Diagnosis not present

## 2024-02-25 DIAGNOSIS — Z7984 Long term (current) use of oral hypoglycemic drugs: Secondary | ICD-10-CM

## 2024-02-25 DIAGNOSIS — Z794 Long term (current) use of insulin: Secondary | ICD-10-CM

## 2024-02-25 DIAGNOSIS — B20 Human immunodeficiency virus [HIV] disease: Secondary | ICD-10-CM

## 2024-02-25 DIAGNOSIS — I10 Essential (primary) hypertension: Secondary | ICD-10-CM

## 2024-02-25 LAB — GLUCOSE, POCT (MANUAL RESULT ENTRY): POC Glucose: 240 mg/dL — AB (ref 70–99)

## 2024-02-25 MED ORDER — DEXCOM G6 SENSOR MISC
5 refills | Status: DC
  Filled 2024-05-19: qty 3, 30d supply, fill #0
  Filled 2024-06-14: qty 3, 30d supply, fill #1

## 2024-02-25 MED ORDER — LANTUS SOLOSTAR 100 UNIT/ML ~~LOC~~ SOPN
16.0000 [IU] | PEN_INJECTOR | Freq: Every day | SUBCUTANEOUS | 3 refills | Status: DC
  Filled 2024-04-01: qty 12, 75d supply, fill #0
  Filled 2024-06-14: qty 12, 75d supply, fill #1

## 2024-02-25 MED ORDER — SITAGLIPTIN PHOSPHATE 50 MG PO TABS: 50.0000 mg | ORAL_TABLET | Freq: Every day | ORAL | 3 refills | Status: DC

## 2024-02-25 NOTE — Assessment & Plan Note (Signed)
 Blood pressure is in adequate control. Cotninue amlodipine -benazepril  5-20 mg daily.

## 2024-02-25 NOTE — Assessment & Plan Note (Signed)
 Improving function. Continue aspirin  81 mg daily and clopidogrel  75 mg daily. Plan to stop this after his next visit.

## 2024-02-25 NOTE — Assessment & Plan Note (Addendum)
 I will increase Bruce Yang's insulin  glargine to 16 units daily. I will send a refill request for his Dexcom sensors. I asked that they send me his Dexcom averages in 1 month. I will add sitagliptin 50 mg daily to his regimen.

## 2024-02-25 NOTE — Progress Notes (Signed)
 [facility] [address] [place] [address] Dept: [phone] Dept Fax: [phone]  Chronic Care Office Visit  Subjective:    Patient ID: Bruce Yang, male    DOB: 09-03-1971, 53 y.o..   MRN: [id number]  Chief Complaint  Patient presents with   Follow-up    2 month f/u.     History of Present Illness:  Patient is in today for reassessment of chronic medical issues.  Mr. Bruce Yang suffered an acute right CVA on Jan 16 with numbness to his left face, arm, and leg.  He has had improvement in the use of his left arm and leg. He is managed on aspirin  81 mg daily and clopidogrel  75 mg daily.   Mr. Bruce Yang  has hypertension. He is managed on amlodipine -benazepril  (Lotrel ) 5-20 mg daily.   Mr. Bruce Yang has hyperlipidemia. He is managed on atorvastatin  80 mg, started in Jan.   Mr. Bruce Yang was seen by Dr. Tilmon Yang in April with new-onset diabetes. He was started on insulin  glargine 12 units daily. He had previously been prescribed metformin  at some point int he past, but stopped the medicine due to diarrhea. He did experience some elevated blood sugars during his hospitalization in January. He notes he will be attending a class on diabetes tomorrow. Mr. Bruce Yang is trying to engage in regular exercise.  Past Medical History: Patient Active Problem List   Diagnosis Date Noted   Uncontrolled type 2 diabetes mellitus with hyperglycemia (HCC) 02/02/2024   Cellulitis of right lower extremity 02/02/2024   History of CVA (cerebrovascular accident) 11/19/2023   Essential hypertension 10/21/2023   Erectile dysfunction 10/21/2023   Aortic atherosclerosis (HCC) 10/21/2023   Carotid artery disease (HCC) 10/21/2023   G6PD deficiency 10/17/2023   HIV disease (HCC) 10/15/2023   Therapeutic drug monitoring 10/15/2023   Alcohol use 10/14/2023   Substance abuse (HCC) 10/14/2023   Cardiac murmur 10/14/2023   Methamphetamine use (HCC)  10/14/2023   Acute CVA (cerebrovascular accident) (HCC) 10/13/2023   Spondylolisthesis, lumbosacral region 09/25/2023   Prediabetes 08/20/2023   Hyperlipidemia 08/20/2023   Hyperglycemia 08/19/2023   Chronic low back pain without sciatica 08/19/2023   Peripheral neuropathy 08/19/2023   Tobacco use disorder 08/19/2023   Marijuana use, continuous 08/19/2023   History reviewed. No pertinent surgical history. Family History  Problem Relation Age of Onset   Other Mother        Homicide   Hypertension Maternal Aunt    Diabetes Maternal Aunt    Cancer Maternal Uncle        Multiple myeloma   Liver disease Neg Hx    Esophageal cancer Neg Hx    Colon cancer Neg Hx    Outpatient Medications Prior to Visit  Medication Sig Dispense Refill   amLODipine -benazepril  (LOTREL ) 5-20 MG capsule Take 1 capsule by mouth daily. 90 capsule 3   aspirin  EC 81 MG tablet Take 1 tablet (81 mg total) by mouth daily. Swallow whole. 90 tablet 3   atorvastatin  (LIPITOR ) 80 MG tablet Take 1 tablet (80 mg total) by mouth daily. 90 tablet 3   clopidogrel  (PLAVIX ) 75 MG tablet Take 1 tablet (75 mg total) by mouth daily. 90 tablet 3   Continuous Glucose Transmitter (DEXCOM G6 TRANSMITTER) MISC 1 Units by Does not apply route every 14 (fourteen) days. 2 each 3   dolutegravir -lamiVUDine  (DOVATO ) 50-300 MG tablet Take 1 tablet by mouth daily. 30 tablet 11   doxycycline  (VIBRAMYCIN ) 100 MG capsule Take 1 capsule (100  mg total) by mouth 2 (two) times daily. 20 capsule 0   folic acid  (FOLVITE ) 1 MG tablet Take 1 tablet (1 mg total) by mouth daily. 30 tablet 1   insulin  glargine (LANTUS ) 100 UNIT/ML Solostar Pen Inject 12 Units into the skin daily. 15 mL 0   Insulin  Pen Needle (INSUPEN PEN NEEDLES) 32G X 4 MM MISC Use once daily 100 each 0   metFORMIN  (GLUCOPHAGE ) 500 MG tablet Take 1 tablet (500 mg total) by mouth 2 (two) times daily with a meal. 60 tablet 1   Multiple Vitamin (MULTIVITAMIN WITH MINERALS) TABS tablet Take  1 tablet by mouth daily.     tadalafil  (CIALIS ) 10 MG tablet Take 1 tablet (10 mg total) by mouth daily as needed for erectile dysfunction. 30 tablet 3   thiamine  (VITAMIN B1) 100 MG tablet Take 1 tablet (100 mg total) by mouth daily. 30 tablet 1   varenicline  (CHANTIX ) 1 MG tablet Take 1 tablet (1 mg total) by mouth 2 (two) times daily. 60 tablet 5   Continuous Glucose Sensor (DEXCOM G6 SENSOR) MISC As directed. 1 each 5   insulin  glargine (LANTUS  SOLOSTAR) 100 UNIT/ML Solostar Pen Inject 12 Units into the skin daily. 15 mL 3   acetaminophen  (TYLENOL ) 500 MG tablet Take 1-2 tablets (500-1,000 mg total) by mouth every 6 (six) hours as needed for mild pain (pain score 1-3) or moderate pain (pain score 4-6). (Patient not taking: Reported on 02/25/2024)     No facility-administered medications prior to visit.   Allergies  Allergen Reactions   Antihistamines, Chlorpheniramine-Type     Affects Prostate    Objective:   Today's Vitals   02/25/24 1524  BP: 130/70  Pulse: 86  Temp: 97.6 F (36.4 C)  TempSrc: Temporal  SpO2: 99%   There is no height or weight on file to calculate BMI.   General: Well developed, well nourished. No acute distress. Psych: Alert and oriented. Normal mood and affect.  Health Maintenance Due  Topic Date Due   FOOT EXAM  Never done   OPHTHALMOLOGY EXAM  Never done   Diabetic kidney evaluation - Urine ACR  Never done   Colonoscopy  Never done   Lab Results    Latest Ref Rng & Units 02/05/2024   11:29 AM 02/05/2024   11:20 AM 01/25/2024    5:23 PM  CMP  Glucose 70 - 99 mg/dL  409  811   BUN 6 - 20 mg/dL  19  12   Creatinine 9.14 - 1.24 mg/dL  7.82  9.56   Sodium 213 - 145 mmol/L 133  132  128   Potassium 3.5 - 5.1 mmol/L 5.3  5.4  4.3   Chloride 98 - 111 mmol/L  100  94   CO2 22 - 32 mmol/L  22  26   Calcium  8.9 - 10.3 mg/dL  9.9  08.6   Total Protein 6.5 - 8.1 g/dL  7.2    Total Bilirubin 0.0 - 1.2 mg/dL  0.3    Alkaline Phos 38 - 126 U/L  110    AST  15 - 41 U/L  24    ALT 0 - 44 U/L  15     Lab Results  Component Value Date   POCGLU 240 (A) 02/25/2024   Last hemoglobin A1c Lab Results  Component Value Date   HGBA1C 12.8 (A) 02/02/2024   Assessment & Plan:   Problem List Items Addressed This Visit  Cardiovascular and Mediastinum   Essential hypertension - Primary   Blood pressure is in adequate control. Cotninue amlodipine -benazepril  5-20 mg daily.        Endocrine   Uncontrolled type 2 diabetes mellitus with hyperglycemia (HCC)   I will increase Mr. Davies's insulin  glargine to 16 units daily. I will send a refill request for his Dexcom sensors. I asked that they send me his Dexcom averages in 1 month. I will add sitagliptin 50 mg daily to his regimen.      Relevant Medications   insulin  glargine (LANTUS  SOLOSTAR) 100 UNIT/ML Solostar Pen   sitaGLIPtin (JANUVIA) 50 MG tablet   Continuous Glucose Sensor (DEXCOM G6 SENSOR) MISC   Other Relevant Orders   POCT Glucose (CBG) (Completed)     Other   History of CVA (cerebrovascular accident)   Improving function. Continue aspirin  81 mg daily and clopidogrel  75 mg daily. Plan to stop this after his next visit.      HIV disease (HCC)   Continue dolutegravir -lamivudine  (Dovato ) 50-300 mg daily. Continue to follow with ID.      Hyperlipidemia   Continue atorvastatin  80 mg daily.       Return in about 2 months (around 04/26/2024) for Reassessment.   Graig Lawyer, MD

## 2024-02-25 NOTE — Assessment & Plan Note (Signed)
 Continue dolutegravir -lamivudine  (Dovato ) 50-300 mg daily. Continue to follow with ID.

## 2024-02-25 NOTE — Assessment & Plan Note (Signed)
 Continue atorvastatin 80 mg daily

## 2024-02-26 ENCOUNTER — Other Ambulatory Visit (HOSPITAL_COMMUNITY): Payer: Self-pay

## 2024-02-26 ENCOUNTER — Other Ambulatory Visit: Payer: Self-pay

## 2024-02-26 ENCOUNTER — Encounter: Attending: Nurse Practitioner | Admitting: Dietician

## 2024-02-26 VITALS — Ht 66.0 in | Wt 153.0 lb

## 2024-02-26 DIAGNOSIS — E1165 Type 2 diabetes mellitus with hyperglycemia: Secondary | ICD-10-CM | POA: Diagnosis not present

## 2024-02-26 NOTE — Progress Notes (Signed)
 Medication was filled by the [facility] staff on 02/09/24. The medication was picked up at [facility] on 02/12/24. Retiming refill call for the next refill.

## 2024-02-27 ENCOUNTER — Encounter: Payer: Self-pay | Admitting: Dietician

## 2024-02-27 NOTE — Progress Notes (Signed)
 Patient was seen on 02/26/2024 for the first of a series of three diabetes self-management courses at [facility]  Patient Education Plan per assessed needs and concerns is to attend three course education program for Diabetes Self Management Education.  A1C was 12.8% on 02/02/2024.  The following learning objectives were met by the patient during this class: Describe diabetes, types of diabetes and pathophysiology State some common risk factors for diabetes Defines the role of glucose and insulin  Describe the relationship between diabetes and cardiovascular and other risks State the members of the Healthcare Team States the rationale for glucose monitoring and when to test State their individual Target Range State the importance of logging glucose readings and how to interpret the readings Identifies A1C target Explain the correlation between A1c and eAG values State symptoms and treatment of high blood glucose and low blood glucose Explain proper technique for glucose testing and identify proper sharps disposal  Handouts given during class include: How to Thrive:  A Guide for Your Journey with Diabetes by the ADA Meal Plan Card and carbohydrate content list Dietary intake form Low Sodium Flavoring Tips Types of Fats Dining Out Label reading Snack list The diabetes portion plate Diabetes Resources A1c to eAG Conversion Chart Blood Glucose Log Diabetes Recommended Care Schedule Support Group Diabetes Success Plan Core Class Satisfaction Survey   Follow-Up Plan: Attend core 2

## 2024-03-02 ENCOUNTER — Other Ambulatory Visit: Payer: Self-pay

## 2024-03-02 NOTE — Progress Notes (Signed)
 Specialty Pharmacy Ongoing Clinical Assessment Note  Bruce Yang is a 53 y.o. male who is being followed by the specialty pharmacy service for RxSp HIV   Patient's specialty medication(s) reviewed today: Dolutegravir -lamiVUDine  (Dovato )   Missed doses in the last 4 weeks: 0   Patient/Caregiver did not have any additional questions or concerns.   Therapeutic benefit summary: Patient is achieving benefit   Adverse events/side effects summary: No adverse events/side effects   Patient's therapy is appropriate to: Continue    Goals Addressed             This Visit's Progress    Achieve Undetectable HIV Viral Load < 20   Improving    Patient is not on track and improving. Patient will work on increased adherence. Patient's viral load as of 02/12/24 was 33 copies/mL      Maintain optimal adherence to therapy   On track    Patient is on track. Patient will maintain adherence.         Follow up: 3 months   Clinical Intervention Note  Clinical Intervention Notes: Patient recently starting taking Januvia, meloxicam , and Lantus . No DDIs identified with Dovato    Clinical Intervention Outcomes: Prevention of an adverse drug event   Advertising account planner

## 2024-03-02 NOTE — Progress Notes (Signed)
 Specialty Pharmacy Refill Coordination Note  Bruce Yang is a 53 y.o. male contacted today regarding refills of specialty medication(s) Dolutegravir -lamiVUDine  (Dovato )   Patient requested Pickup at [facility] [place] date: 03/03/24   Medication will be filled on 03/03/24.

## 2024-03-03 ENCOUNTER — Other Ambulatory Visit: Payer: Self-pay

## 2024-03-04 ENCOUNTER — Encounter: Attending: Nurse Practitioner | Admitting: Skilled Nursing Facility1

## 2024-03-11 ENCOUNTER — Encounter: Attending: Family Medicine | Admitting: Dietician

## 2024-03-11 DIAGNOSIS — E1165 Type 2 diabetes mellitus with hyperglycemia: Secondary | ICD-10-CM | POA: Diagnosis present

## 2024-03-12 ENCOUNTER — Encounter: Payer: Self-pay | Admitting: Dietician

## 2024-03-12 NOTE — Progress Notes (Signed)
 Patient was seen on 03/11/2024 for the third of a series of three diabetes self-management courses at the [facility].   State the amount of activity recommended for healthy living Describe activities suitable for individual needs Identify ways to regularly incorporate activity into daily life Identify barriers to activity and ways to over come these barriers Identify diabetes medications being personally used and their primary action for lowering glucose and possible side effects Describe role of stress on blood glucose and develop strategies to address psychosocial issues Identify diabetes complications and ways to prevent them Explain how to manage diabetes during illness Evaluate success in meeting personal goal Establish 2-3 goals that they will plan to diligently work on  Goals:  I will count my carb choices at most meals and snacks I will be active 30 minutes or more 5 times a week I will take my diabetes medications as scheduled I will eat less unhealthy fats by eating less meats I will test my glucose at least 1 times a day, 5 days a week I will look at patterns in my record book at least 5 days a month To help manage stress I will  relax at least 3 times a week  Your patient has identified these potential barriers to change:  none  Your patient has identified their diabetes self-care support plan as  Family Support   Plan:  Attend Support Group as desired Follow up with RD 06/2024

## 2024-03-18 DIAGNOSIS — Z419 Encounter for procedure for purposes other than remedying health state, unspecified: Secondary | ICD-10-CM | POA: Diagnosis not present

## 2024-03-19 ENCOUNTER — Telehealth: Payer: Self-pay

## 2024-03-19 NOTE — Telephone Encounter (Signed)
 Will call patient on Monday. Dm/cma

## 2024-03-19 NOTE — Telephone Encounter (Signed)
 Copied from CRM [id number]. Topic: Clinical - Medical Advice >> Mar 18, 2024  1:37 PM Clyde Darling P wrote: Reason for CRM: pt fiance' advise pt has dexcom app on device to monitor sugar however his phone has updated and the app is not compatible with his device anymore, pt fiance' would like to know is there another blood sugar monitor app that would be compatible - Celinda Collar can be reached [phone]

## 2024-03-22 NOTE — Telephone Encounter (Signed)
Left VM to rtn call. Dm/cma       

## 2024-03-23 ENCOUNTER — Other Ambulatory Visit: Payer: Self-pay | Admitting: Physician Assistant

## 2024-03-23 ENCOUNTER — Other Ambulatory Visit: Payer: Self-pay | Admitting: Family Medicine

## 2024-03-23 ENCOUNTER — Other Ambulatory Visit: Payer: Self-pay

## 2024-03-24 NOTE — Telephone Encounter (Signed)
 Spoke to patient's fiance, Taniesa, they got a new [employer] Phone and having issues with comparability with app.  Advised them to check his sugars at least once daily till they figure out what they can do.    Will do this and see him at his next appointment.  Dm/cma

## 2024-04-01 ENCOUNTER — Other Ambulatory Visit (HOSPITAL_BASED_OUTPATIENT_CLINIC_OR_DEPARTMENT_OTHER): Payer: Self-pay

## 2024-04-05 ENCOUNTER — Ambulatory Visit: Payer: Self-pay

## 2024-04-05 ENCOUNTER — Other Ambulatory Visit: Payer: Self-pay
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
## 2024-04-05 NOTE — Telephone Encounter (Signed)
 Noted. Dm/cma

## 2024-04-05 NOTE — Progress Notes (Signed)
 Specialty Pharmacy Refill Coordination Note  Bruce Yang is a 53 y.o. male contacted today regarding refills of specialty medication(s) Dolutegravir -lamiVUDine  (Dovato )   Patient requested Mary[facility] at [facility] [place] date: 04/07/24   Medication will be filled on 04/06/24

## 2024-04-06 ENCOUNTER — Other Ambulatory Visit (HOSPITAL_BASED_OUTPATIENT_CLINIC_OR_DEPARTMENT_OTHER): Payer: Self-pay

## 2024-04-06 ENCOUNTER — Ambulatory Visit (INDEPENDENT_AMBULATORY_CARE_PROVIDER_SITE_OTHER): Admitting: Family Medicine

## 2024-04-06 ENCOUNTER — Encounter: Payer: Self-pay | Admitting: Family Medicine

## 2024-04-06 ENCOUNTER — Other Ambulatory Visit (HOSPITAL_COMMUNITY): Payer: Self-pay

## 2024-04-06 VITALS — BP 124/70 | HR 66 | Temp 97.9°F | Ht 66.0 in | Wt 162.0 lb

## 2024-04-06 DIAGNOSIS — M1990 Unspecified osteoarthritis, unspecified site: Secondary | ICD-10-CM

## 2024-04-06 DIAGNOSIS — F320 Major depressive disorder, single episode, mild: Secondary | ICD-10-CM

## 2024-04-06 DIAGNOSIS — M7581 Other shoulder lesions, right shoulder: Secondary | ICD-10-CM | POA: Diagnosis not present

## 2024-04-06 DIAGNOSIS — B20 Human immunodeficiency virus [HIV] disease: Secondary | ICD-10-CM

## 2024-04-06 DIAGNOSIS — E1165 Type 2 diabetes mellitus with hyperglycemia: Secondary | ICD-10-CM | POA: Diagnosis not present

## 2024-04-06 DIAGNOSIS — Z794 Long term (current) use of insulin: Secondary | ICD-10-CM

## 2024-04-06 DIAGNOSIS — Z7984 Long term (current) use of oral hypoglycemic drugs: Secondary | ICD-10-CM

## 2024-04-06 MED ORDER — NAPROXEN 500 MG PO TABS
500.0000 mg | ORAL_TABLET | Freq: Two times a day (BID) | ORAL | 0 refills | Status: DC
  Filled 2024-04-06 (×2): qty 14, 7d supply, fill #0

## 2024-04-06 MED ORDER — BUPROPION HCL ER (XL) 150 MG PO TB24
150.0000 mg | ORAL_TABLET | Freq: Every day | ORAL | 3 refills | Status: DC
  Filled 2024-04-06 (×2): qty 30, 30d supply, fill #0
  Filled 2024-05-03: qty 30, 30d supply, fill #1
  Filled 2024-06-08: qty 30, 30d supply, fill #2
  Filled 2024-07-06: qty 30, 30d supply, fill #3

## 2024-04-06 NOTE — Assessment & Plan Note (Signed)
 Exam is consistent with a rotator cuff tendinitis. I will treat him with moist heat with pendulum ROM exercises and a 7-day course of naproxen . If not improved after 1 week, I would consider an intra-articular steroid injection.

## 2024-04-06 NOTE — Assessment & Plan Note (Signed)
 Mr. Bruce Yang has findings of an acute arthritis of the left knee. This could be due to a flare of osteoarthritis, a case of gout, or an acute infectious cause (septic arthritis). There does not appear to be a significant effusion at this point, so I will hold off on an attempt to aspirate the joint. I will check a CBC, ESR and uric acid level.

## 2024-04-06 NOTE — Assessment & Plan Note (Signed)
 Continue dolutegravir -lamivudine  (Dovato ) 50-300 mg daily. Continue to follow with ID.

## 2024-04-06 NOTE — Assessment & Plan Note (Signed)
 Symptoms are suggestive of mild depression. I will try him on bupropion  150 mg XL daily and see if this will both help his mood and further his efforts at smoking cessation.

## 2024-04-06 NOTE — Assessment & Plan Note (Signed)
 Continue insulin  glargine 16 units daily and sitagliptin  50 mg daily. I will check his glucose today.

## 2024-04-06 NOTE — Progress Notes (Signed)
 [facility] [address] [place] [address] Dept: [phone] Dept Fax: [phone]  Office Visit  Subjective:    Patient ID: Bruce Yang, male    DOB: Apr 10, 1971, 53 y.o..   MRN: [id number]  Chief Complaint  Patient presents with   Leg Swelling    Knee/leg swelling.     History of Present Illness:  Patient is in today with two new joint issues.  Bruce Yang notes that he has some pain in his right shoulder. He relates this to when he got a recent Shingrix  vaccination. However, he has limited ROM and pain associated with sleeping with his arm above his head.  Bruce Yang notes his left knee has been swollen recently and tender to touch. he feels there has been increased warmth in the knee. He has not run fever. He has no prior history of gout. He does not recall an injury to the knee. He does have a history of HIV.  Bruce Yang has a recent history of developing Type 2 diabetes. He is currently managed on insulin  glargine 16 units at bedtime and sitagliptin  50 mg daily. Since his last visit, he has gone for diabetes teaching. He is not testing his sugars at home consistently.  Bruce Yang notes that he has had issues with a depressed mood at times. His SO notes he can be very irritable and snaps at other people. This appears to have been triggered by the multiple health concerns he developed over the past 6 months.  Past Medical History: Patient Active Problem List   Diagnosis Date Noted   Depression, major, single episode, mild (HCC) 04/06/2024   Tendinitis of right rotator cuff 04/06/2024   Acute arthritis- Left knee 04/06/2024   Uncontrolled type 2 diabetes mellitus with hyperglycemia (HCC) 02/02/2024   Cellulitis of right lower extremity 02/02/2024   History of CVA (cerebrovascular accident) 11/19/2023   Essential hypertension 10/21/2023   Erectile dysfunction 10/21/2023   Aortic atherosclerosis (HCC)  10/21/2023   Carotid artery disease (HCC) 10/21/2023   G6PD deficiency 10/17/2023   HIV disease (HCC) 10/15/2023   Therapeutic drug monitoring 10/15/2023   Alcohol use 10/14/2023   Substance abuse (HCC) 10/14/2023   Cardiac murmur 10/14/2023   Methamphetamine use (HCC) 10/14/2023   Acute CVA (cerebrovascular accident) (HCC) 10/13/2023   Spondylolisthesis, lumbosacral region 09/25/2023   Prediabetes 08/20/2023   Hyperlipidemia 08/20/2023   Hyperglycemia 08/19/2023   Chronic low back pain without sciatica 08/19/2023   Peripheral neuropathy 08/19/2023   Tobacco use disorder 08/19/2023   Marijuana use, continuous 08/19/2023   No past surgical history on file. Family History  Problem Relation Age of Onset   Other Mother        Homicide   Hypertension Maternal Aunt    Diabetes Maternal Aunt    Cancer Maternal Uncle        Multiple myeloma   Liver disease Neg Hx    Esophageal cancer Neg Hx    Colon cancer Neg Hx    Outpatient Medications Prior to Visit  Medication Sig Dispense Refill   acetaminophen  (TYLENOL ) 500 MG tablet Take 1-2 tablets (500-1,000 mg total) by mouth every 6 (six) hours as needed for mild pain (pain score 1-3) or moderate pain (pain score 4-6).     amLODipine -benazepril  (LOTREL ) 5-20 MG capsule Take 1 capsule by mouth daily. 90 capsule 3   aspirin  EC 81 MG tablet Take 1 tablet (81 mg total) by mouth daily. Swallow whole. 90 tablet  3   atorvastatin  (LIPITOR ) 80 MG tablet Take 1 tablet (80 mg total) by mouth daily. 90 tablet 3   clopidogrel  (PLAVIX ) 75 MG tablet Take 1 tablet (75 mg total) by mouth daily. 90 tablet 3   Continuous Glucose Sensor (DEXCOM G6 SENSOR) MISC As directed. 1 each 5   Continuous Glucose Transmitter (DEXCOM G6 TRANSMITTER) MISC 1 Units by Does not apply route every 14 (fourteen) days. 2 each 3   dolutegravir -lamiVUDine  (DOVATO ) 50-300 MG tablet Take 1 tablet by mouth daily. 30 tablet 11   doxycycline  (VIBRAMYCIN ) 100 MG capsule Take 1 capsule  (100 mg total) by mouth 2 (two) times daily. 20 capsule 0   folic acid  (FOLVITE ) 1 MG tablet Take 1 tablet (1 mg total) by mouth daily. 30 tablet 1   insulin  glargine (LANTUS  SOLOSTAR) 100 UNIT/ML Solostar Pen Inject 16 Units into the skin daily. 15 mL 3   insulin  glargine (LANTUS ) 100 UNIT/ML Solostar Pen Inject 12 Units into the skin daily. 15 mL 0   Insulin  Pen Needle (INSUPEN PEN NEEDLES) 32G X 4 MM MISC Use once daily 100 each 0   meloxicam  (MOBIC ) 15 MG tablet TAKE 1 TABLET (15 MG TOTAL) BY MOUTH DAILY. 30 tablet 0   Multiple Vitamin (MULTIVITAMIN WITH MINERALS) TABS tablet Take 1 tablet by mouth daily.     sitaGLIPtin  (JANUVIA ) 50 MG tablet Take 1 tablet (50 mg total) by mouth daily. 90 tablet 3   tadalafil  (CIALIS ) 10 MG tablet Take 1 tablet (10 mg total) by mouth daily as needed for erectile dysfunction. 30 tablet 3   thiamine  (VITAMIN B1) 100 MG tablet Take 1 tablet (100 mg total) by mouth daily. 30 tablet 1   varenicline  (CHANTIX ) 1 MG tablet Take 1 tablet (1 mg total) by mouth 2 (two) times daily. 60 tablet 5   No facility-administered medications prior to visit.   Allergies  Allergen Reactions   Antihistamines, Chlorpheniramine-Type     Affects Prostate      Objective:   Today's Vitals   04/06/24 1456  BP: 124/70  Pulse: 66  Temp: 97.9 F (36.6 C)  TempSrc: Temporal  SpO2: 99%  Weight: 162 lb (73.5 kg)  Height: 5' 6 (1.676 m)   Body mass index is 26.15 kg/m.   General: Well developed, well nourished. No acute distress. Extremities: There is limited internal rotation and abduction of the right shoulder. The empty can test is positive for   pain and mild weakness. The left knee does not appears significantly swollen, though there may be a small effusion.   There is mildly increased warmth of the knee. There is some pain with palpation to the lateral aspect oft he patellar   tendon. Skin: Warm and dry. No rashes. Neuro: CN II-XII intact. Normal sensation and DTR  bilaterally. Psych: Alert and oriented. Normal mood and affect.  Health Maintenance Due  Topic Date Due   FOOT EXAM  Never done   OPHTHALMOLOGY EXAM  Never done   Diabetic kidney evaluation - Urine ACR  Never done   Hepatitis B Vaccines (1 of 3 - 19+ 3-dose series) Never done   Colonoscopy  Never done     Assessment & Plan:   Problem List Items Addressed This Visit       Endocrine   Uncontrolled type 2 diabetes mellitus with hyperglycemia (HCC)   Continue insulin  glargine 16 units daily and sitagliptin  50 mg daily. I will check his glucose today.      Relevant  Orders   Glucose, random     Musculoskeletal and Integument   Acute arthritis- Left knee   Mr. Panuco has findings of an acute arthritis of the left knee. This could be due to a flare of osteoarthritis, a case of gout, or an acute infectious cause (septic arthritis). There does not appear to be a significant effusion at this point, so I will hold off on an attempt to aspirate the joint. I will check a CBC, ESR and uric acid level.      Relevant Medications   naproxen  (NAPROSYN ) 500 MG tablet   Other Relevant Orders   CBC with Differential/Platelet   C-reactive protein   Sedimentation rate   Uric acid   Tendinitis of right rotator cuff - Primary   Exam is consistent with a rotator cuff tendinitis. I will treat him with moist heat with pendulum ROM exercises and a 7-day course of naproxen . If not improved after 1 week, I would consider an intra-articular steroid injection.      Relevant Medications   naproxen  (NAPROSYN ) 500 MG tablet     Other   Depression, major, single episode, mild (HCC)   Symptoms are suggestive of mild depression. I will try him on bupropion  150 mg XL daily and see if this will both help his mood and further his efforts at smoking cessation.      Relevant Medications   buPROPion  (WELLBUTRIN  XL) 150 MG 24 hr tablet   HIV disease (HCC)   Continue dolutegravir -lamivudine  (Dovato ) 50-300 mg  daily. Continue to follow with ID.       Return for Follow-up as scheduled.   Garnette CHRISTELLA Simpler, MD

## 2024-04-07 ENCOUNTER — Ambulatory Visit: Payer: Self-pay | Admitting: Family Medicine

## 2024-04-07 LAB — URIC ACID: Uric Acid, Serum: 6.4 mg/dL (ref 4.0–7.8)

## 2024-04-07 LAB — CBC WITH DIFFERENTIAL/PLATELET
Basophils Absolute: 0 10*3/uL (ref 0.0–0.1)
Basophils Relative: 1 % (ref 0.0–3.0)
Eosinophils Absolute: 0 10*3/uL (ref 0.0–0.7)
Eosinophils Relative: 0.7 % (ref 0.0–5.0)
HCT: 38.8 % — ABNORMAL LOW (ref 39.0–52.0)
Hemoglobin: 13 g/dL (ref 13.0–17.0)
Lymphocytes Relative: 26.3 % (ref 12.0–46.0)
Lymphs Abs: 1.2 10*3/uL (ref 0.7–4.0)
MCHC: 33.6 g/dL (ref 30.0–36.0)
MCV: 94.5 fl (ref 78.0–100.0)
Monocytes Absolute: 0.4 10*3/uL (ref 0.1–1.0)
Monocytes Relative: 9.9 % (ref 3.0–12.0)
Neutro Abs: 2.8 10*3/uL (ref 1.4–7.7)
Neutrophils Relative %: 62.1 % (ref 43.0–77.0)
Platelets: 269 10*3/uL (ref 150.0–400.0)
RBC: 4.1 Mil/uL — ABNORMAL LOW (ref 4.22–5.81)
RDW: 12.8 % (ref 11.5–15.5)
WBC: 4.5 10*3/uL (ref 4.0–10.5)

## 2024-04-07 LAB — SEDIMENTATION RATE: Sed Rate: 6 mm/h (ref 0–20)

## 2024-04-07 LAB — GLUCOSE, RANDOM: Glucose, Bld: 173 mg/dL — ABNORMAL HIGH (ref 70–99)

## 2024-04-07 LAB — C-REACTIVE PROTEIN: CRP: <1 mg/dL (ref 0.5–20.0)

## 2024-04-13 NOTE — Progress Notes (Addendum)
 Cardiology Office Note:    Date:  04/16/2024   ID:  Bruce Yang, DOB [birth year], MRN [id number]  PCP:  [name], MD   [facility] Providers Cardiologist:  None     Referring MD: [name], MD   Chief Complaint  Patient presents with   Congestive Heart Failure    History of Present Illness:    Bruce Yang is a 53 y.o. male seen at the request of Lauraine Born NP for evaluation of LV dysfunction found on Echo at time of acute CVA. He was admitted in January 2025 with acute right thalamic and left basal ganglion infarct. Felt to be related to small vessel cerebrovascular disease. Echo during that hospitalization showed reduced EF 40-45% with global hypokinesis. Had mild AS as well. Vascular risk factors: HTN, DM, HLD, HIV positive, tobacco abuse, alcohol abuse, substance abuse.   The patient reports no known history of cardiac disease. He denies any chest pain, dyspnea or palpitations. Slight swelling right ankle. Has not been taking antihypertensive therapy. Has been trying to eat better. He has significantly reduced smoking - now 1 pack last 2 weeks. Used to drink Etoh heavily but now infrequent and no liquor. Not smoking weed anymore.   Past Medical History:  Diagnosis Date   Diabetes mellitus without complication (HCC)    HIV disease (HCC)    Hyperlipidemia    Hypertension    Recurrent boils of anus    Stroke [facility])     History reviewed. No pertinent surgical history.  Current Medications: Current Meds  Medication Sig   acetaminophen  (TYLENOL ) 500 MG tablet Take 1-2 tablets (500-1,000 mg total) by mouth every 6 (six) hours as needed for mild pain (pain score 1-3) or moderate pain (pain score 4-6).   aspirin  EC 81 MG tablet Take 1 tablet (81 mg total) by mouth daily. Swallow whole.   atorvastatin  (LIPITOR ) 80 MG tablet Take 1 tablet (80 mg total) by mouth daily.   buPROPion  (WELLBUTRIN  XL) 150 MG 24 hr tablet Take 1 tablet (150 mg total) by mouth  daily.   clopidogrel  (PLAVIX ) 75 MG tablet Take 1 tablet (75 mg total) by mouth daily.   Continuous Glucose Sensor (DEXCOM G6 SENSOR) MISC As directed.   Continuous Glucose Transmitter (DEXCOM G6 TRANSMITTER) MISC 1 Units by Does not apply route every 14 (fourteen) days.   dolutegravir -lamiVUDine  (DOVATO ) 50-300 MG tablet Take 1 tablet by mouth daily.   doxycycline  (VIBRAMYCIN ) 100 MG capsule Take 1 capsule (100 mg total) by mouth 2 (two) times daily.   folic acid  (FOLVITE ) 1 MG tablet Take 1 tablet (1 mg total) by mouth daily.   insulin  glargine (LANTUS  SOLOSTAR) 100 UNIT/ML Solostar Pen Inject 16 Units into the skin daily.   insulin  glargine (LANTUS ) 100 UNIT/ML Solostar Pen Inject 12 Units into the skin daily.   Insulin  Pen Needle (INSUPEN PEN NEEDLES) 32G X 4 MM MISC Use once daily   meloxicam  (MOBIC ) 15 MG tablet TAKE 1 TABLET (15 MG TOTAL) BY MOUTH DAILY.   Multiple Vitamin (MULTIVITAMIN WITH MINERALS) TABS tablet Take 1 tablet by mouth daily.   naproxen  (NAPROSYN ) 500 MG tablet Take 1 tablet (500 mg total) by mouth 2 (two) times daily with a meal.   sacubitril -valsartan  (ENTRESTO ) 24-26 MG Take 1 tablet by mouth 2 (two) times daily.   sitaGLIPtin  (JANUVIA ) 50 MG tablet Take 1 tablet (50 mg total) by mouth daily.   tadalafil  (CIALIS ) 10 MG tablet Take 1 tablet (10 mg total)  by mouth daily as needed for erectile dysfunction.   thiamine  (VITAMIN B1) 100 MG tablet Take 1 tablet (100 mg total) by mouth daily.   varenicline  (CHANTIX ) 1 MG tablet Take 1 tablet (1 mg total) by mouth 2 (two) times daily.     Allergies:   Antihistamines, chlorpheniramine-type   Social History   Socioeconomic History   Marital status: Significant Other    Spouse name: Not on file   Number of children: 8   Years of education: Not on file   Highest education level: Associate degree: occupational, Scientist, product/process development, or vocational program  Occupational History   Occupation: Psychologist, occupational  Tobacco Use   Smoking status:  Former    Types: Cigarettes    Start date: 01/22/2024    Quit date: 10/07/1981    Years since quitting: 42.5    Passive exposure: Never   Smokeless tobacco: Never   Tobacco comments:    Quit smoking 3 weeks ago-02/12/24  Vaping Use   Vaping status: Never Used  Substance and Sexual Activity   Alcohol use: Not Currently    Alcohol/week: 2.0 standard drinks of alcohol    Types: 2 Shots of liquor per week   Drug use: Not Currently    Frequency: 5.0 times per week    Types: Marijuana   Sexual activity: Yes    Partners: Female  Other Topics Concern   Not on file  Social History Narrative   Left handed   Caffeine- 1 cup   Not currently working    Social Drivers of Corporate investment banker Strain: Not on file  Food Insecurity: No Food Insecurity (10/14/2023)   Hunger Vital Sign    Worried About Running Out of Food in the Last Year: Never true    Ran Out of Food in the Last Year: Never true  Transportation Needs: Patient Declined (10/14/2023)   PRAPARE - Administrator, Civil Service (Medical): Patient declined    Lack of Transportation (Non-Medical): Patient declined  Physical Activity: Not on file  Stress: Not on file  Social Connections: Not on file     Family History: The patient's family history includes Cancer in his maternal uncle; Diabetes in his maternal aunt; Hypertension in his maternal aunt; Other in his mother. There is no history of Liver disease, Esophageal cancer, or Colon cancer.  ROS:   Please see the history of present illness.     All other systems reviewed and are negative.  EKGs/Labs/Other Studies Reviewed:    The following studies were reviewed today:  EKG Interpretation Date/Time:  Friday April 16 2024 15:34:19 EDT Ventricular Rate:  78 PR Interval:  146 QRS Duration:  88 QT Interval:  370 QTC Calculation: 421 R Axis:   51  Text Interpretation: Normal sinus rhythm Marked ST abnormality, possible inferior subendocardial injury When  compared with ECG of 05-Feb-2024 12:28, No significant change since last tracing Confirmed by [place], Neel Buffone [phone]) on 04/16/2024 3:38:56 PM   Echo 10/14/23: IMPRESSIONS     1. Left ventricular ejection fraction, by estimation, is 40 to 45%. The  left ventricle has mildly decreased function. The left ventricle  demonstrates global hypokinesis. Indeterminate diastolic filling due to  E-A fusion.   2. Right ventricular systolic function is normal. The right ventricular  size is mildly enlarged. Tricuspid regurgitation signal is inadequate for  assessing PA pressure.   3. Left atrial size was mildly dilated.   4. The mitral valve is grossly normal. Trivial mitral valve  regurgitation. No evidence of mitral stenosis.   5. The aortic valve is calcified. Aortic valve regurgitation is mild.  Mild aortic valve stenosis. Aortic valve area, by VTI measures 1.13 cm.  Aortic valve mean gradient measures 13.0 mmHg. Aortic valve Vmax measures  2.53 m/s.   6. The inferior vena cava is normal in size with greater than 50%  respiratory variability, suggesting right atrial pressure of 3 mmHg.   EKG Interpretation Date/Time:  Friday April 16 2024 15:34:19 EDT Ventricular Rate:  78 PR Interval:  146 QRS Duration:  88 QT Interval:  370 QTC Calculation: 421 R Axis:   51  Text Interpretation: Normal sinus rhythm Marked ST abnormality, possible inferior subendocardial injury When compared with ECG of 05-Feb-2024 12:28, No significant change since last tracing Confirmed by [place], Cash Duce [phone]) on 04/16/2024 3:38:56 PM    Recent Labs: 08/19/2023: TSH 0.88 10/14/2023: Magnesium 1.7 02/05/2024: ALT 15; BUN 19; Creatinine, Ser 0.94; Potassium 5.3; Sodium 133 04/06/2024: Hemoglobin 13.0; Platelets 269.0  Recent Lipid Panel    Component Value Date/Time   CHOL 290 (H) 10/14/2023 0451   TRIG 102 10/14/2023 0451   HDL 62 10/14/2023 0451   CHOLHDL 4.7 10/14/2023 0451   VLDL 20 10/14/2023 0451   LDLCALC 208 (H)  10/14/2023 0451     Risk Assessment/Calculations:      HYPERTENSION CONTROL Vitals:   04/16/24 1544 04/16/24 1645  BP: (!) 170/98 (!) 160/84    The patient's blood pressure is elevated above target today.  In order to address the patient's elevated BP: A new medication was prescribed today.            Physical Exam:    VS:  BP (!) 160/84 (Cuff Size: Normal)   Pulse 78     Wt Readings from Last 3 Encounters:  04/06/24 162 lb (73.5 kg)  02/27/24 153 lb (69.4 kg)  02/12/24 149 lb 12.8 oz (67.9 kg)     GEN:  Well nourished, well developed in no acute distress. Uses a cane HEENT: Normal NECK: No JVD; No carotid bruits LYMPHATICS: No lymphadenopathy CARDIAC: RRR, gr 2/6 harsh murmur RUSB to apex.  RESPIRATORY:  Clear to auscultation without rales, wheezing or rhonchi  ABDOMEN: Soft, non-tender, non-distended MUSCULOSKELETAL:  No edema; No deformity  SKIN: Warm and dry NEUROLOGIC:  Alert and oriented x 3 PSYCHIATRIC:  Normal affect   ASSESSMENT:    1. Chronic systolic congestive heart failure (HCC)   2. Acute CVA (cerebrovascular accident) (HCC)   3. Primary hypertension   4. Uncontrolled type 2 diabetes mellitus with hyperglycemia (HCC)   5. Pure hypercholesterolemia    PLAN:    In order of problems listed above:  Chronic systolic CHF. Class 1 symptoms. EF 40-45% by Echo. Multiple issues may have contributed to cardiomyopathy including Etoh, HIV, HTN, DM. Need to exclude CAD given multiple risk factors and abnormal Ecg. Will schedule for Lexiscan myoview. Will start therapy with Entresto  24/26 mg bid. Follow up after stress test for further medication titration. Instructed on importance of low sodium diet and avoidance of Etoh and illicit drug use.  HTN. Poorly controlled. Initiate Entresto  and follow DM on insulin . Per PCP. HLD. On high dose statin. LDL high in Jan. Will update labs.  Tobacco abuse. Encouraged complete smoking cessation. History of Etoh abuse  counseled on abstinence HIV  Aortic stenosis. Mild by prior Echo. Will repeat Echo once CHF therapy optimized.       Informed Consent   Shared Decision Making/Informed  Consent The risks [chest pain, shortness of breath, cardiac arrhythmias, dizziness, blood pressure fluctuations, myocardial infarction, stroke/transient ischemic attack, nausea, vomiting, allergic reaction, radiation exposure, metallic taste sensation and life-threatening complications (estimated to be 1 in 10,000)], benefits (risk stratification, diagnosing coronary artery disease, treatment guidance) and alternatives of a nuclear stress test were discussed in detail with Bruce Yang and he agrees to proceed.       Medication Adjustments/Labs and Tests Ordered: Current medicines are reviewed at length with the patient today.  Concerns regarding medicines are outlined above.  Orders Placed This Encounter  Procedures   Cardiac Stress Test: Informed Consent Details: Physician/Practitioner Attestation; Transcribe to consent form and obtain patient signature   MYOCARDIAL PERFUSION IMAGING   EKG 12-Lead   Meds ordered this encounter  Medications   sacubitril -valsartan  (ENTRESTO ) 24-26 MG    Sig: Take 1 tablet by mouth 2 (two) times daily.    Dispense:  60 tablet    Refill:  6    Patient Instructions  Medication Instructions:  Start Entresto  24/26 mg twice a day Continue all other medciations *If you need a refill on your cardiac medications before your next appointment, please call your pharmacy*  Lab Work: None ordered  Testing/Procedures: Schedule Lexiscan   First available  No caffeine morning of Lexiscan No food 4 hours before No lotion or after shave   Follow-Up: At [facility], you and your health needs are our priority.  As part of our continuing mission to provide you with exceptional heart care, our providers are all part of one team.  This team includes your primary Cardiologist (physician)  and Advanced Practice Providers or APPs (Physician Assistants and Nurse Practitioners) who all work together to provide you with the care you need, when you need it.  Your next appointment:  After Lexiscan    Provider:  Josefa Beauvais NP   We recommend signing up for the patient portal called MyChart.  Sign up information is provided on this After Visit Summary.  MyChart is used to connect with patients for Virtual Visits (Telemedicine).  Patients are able to view lab/test results, encounter notes, upcoming appointments, etc.  Non-urgent messages can be sent to your provider as well.   To learn more about what you can do with MyChart, go to [email or web address].         Signed, Marlan Steward [place], MD  04/16/2024 4:45 PM    [facility]

## 2024-04-16 ENCOUNTER — Other Ambulatory Visit (HOSPITAL_BASED_OUTPATIENT_CLINIC_OR_DEPARTMENT_OTHER): Payer: Self-pay

## 2024-04-16 ENCOUNTER — Other Ambulatory Visit (HOSPITAL_COMMUNITY): Payer: Self-pay

## 2024-04-16 ENCOUNTER — Encounter: Payer: Self-pay | Admitting: Cardiology

## 2024-04-16 ENCOUNTER — Ambulatory Visit: Attending: Cardiology | Admitting: Cardiology

## 2024-04-16 VITALS — BP 160/84 | HR 78

## 2024-04-16 DIAGNOSIS — I10 Essential (primary) hypertension: Secondary | ICD-10-CM | POA: Diagnosis not present

## 2024-04-16 DIAGNOSIS — I5022 Chronic systolic (congestive) heart failure: Secondary | ICD-10-CM

## 2024-04-16 DIAGNOSIS — E7800 Pure hypercholesterolemia, unspecified: Secondary | ICD-10-CM | POA: Diagnosis present

## 2024-04-16 DIAGNOSIS — E1165 Type 2 diabetes mellitus with hyperglycemia: Secondary | ICD-10-CM | POA: Diagnosis not present

## 2024-04-16 DIAGNOSIS — I639 Cerebral infarction, unspecified: Secondary | ICD-10-CM

## 2024-04-16 MED ORDER — SACUBITRIL-VALSARTAN 24-26 MG PO TABS
1.0000 | ORAL_TABLET | Freq: Two times a day (BID) | ORAL | 6 refills | Status: DC
  Filled 2024-04-16 (×2): qty 60, 30d supply, fill #0

## 2024-04-16 NOTE — Addendum Note (Signed)
 Addended by: CHRISTIANNE CHANNING PARAS on: 04/16/2024 04:54 PM   Modules accepted: Orders

## 2024-04-16 NOTE — Patient Instructions (Addendum)
 Medication Instructions:  Start Entresto  24/26 mg twice a day Continue all other medciations *If you need a refill on your cardiac medications before your next appointment, please call your pharmacy*  Lab Work: None ordered  Testing/Procedures: Schedule Lexiscan   First available  No caffeine morning of Lexiscan No food 4 hours before No lotion or after shave   Follow-Up: At [facility], you and your health needs are our priority.  As part of our continuing mission to provide you with exceptional heart care, our providers are all part of one team.  This team includes your primary Cardiologist (physician) and Advanced Practice Providers or APPs (Physician Assistants and Nurse Practitioners) who all work together to provide you with the care you need, when you need it.  Your next appointment:  After Lexiscan    Provider:  Josefa Beauvais NP   We recommend signing up for the patient portal called MyChart.  Sign up information is provided on this After Visit Summary.  MyChart is used to connect with patients for Virtual Visits (Telemedicine).  Patients are able to view lab/test results, encounter notes, upcoming appointments, etc.  Non-urgent messages can be sent to your provider as well.   To learn more about what you can do with MyChart, go to [email or web address].

## 2024-04-17 ENCOUNTER — Other Ambulatory Visit (HOSPITAL_COMMUNITY): Payer: Self-pay

## 2024-04-17 DIAGNOSIS — Z419 Encounter for procedure for purposes other than remedying health state, unspecified: Secondary | ICD-10-CM | POA: Diagnosis not present

## 2024-04-19 ENCOUNTER — Telehealth (HOSPITAL_COMMUNITY): Payer: Self-pay

## 2024-04-19 NOTE — Telephone Encounter (Signed)
 Spoke with the patient's girlfriend per his DPR. Detailed instructions given. S.Xavion Muscat CCT

## 2024-04-20 ENCOUNTER — Other Ambulatory Visit: Payer: Self-pay | Admitting: Cardiology

## 2024-04-20 DIAGNOSIS — I5022 Chronic systolic (congestive) heart failure: Secondary | ICD-10-CM

## 2024-04-20 DIAGNOSIS — E1165 Type 2 diabetes mellitus with hyperglycemia: Secondary | ICD-10-CM

## 2024-04-20 DIAGNOSIS — E7800 Pure hypercholesterolemia, unspecified: Secondary | ICD-10-CM

## 2024-04-20 DIAGNOSIS — I639 Cerebral infarction, unspecified: Secondary | ICD-10-CM

## 2024-04-20 DIAGNOSIS — I10 Essential (primary) hypertension: Secondary | ICD-10-CM

## 2024-04-23 ENCOUNTER — Ambulatory Visit (HOSPITAL_COMMUNITY)
Admission: RE | Admit: 2024-04-23 | Discharge: 2024-04-23 | Disposition: A | Discharge status: Home / Self Care | Source: Ambulatory Visit | Attending: Cardiology | Admitting: Cardiology

## 2024-04-23 DIAGNOSIS — I2510 Atherosclerotic heart disease of native coronary artery without angina pectoris: Secondary | ICD-10-CM | POA: Insufficient documentation

## 2024-04-23 DIAGNOSIS — E7800 Pure hypercholesterolemia, unspecified: Secondary | ICD-10-CM | POA: Diagnosis present

## 2024-04-23 DIAGNOSIS — I10 Essential (primary) hypertension: Secondary | ICD-10-CM

## 2024-04-23 DIAGNOSIS — R911 Solitary pulmonary nodule: Secondary | ICD-10-CM | POA: Insufficient documentation

## 2024-04-23 DIAGNOSIS — I110 Hypertensive heart disease with heart failure: Secondary | ICD-10-CM | POA: Insufficient documentation

## 2024-04-23 DIAGNOSIS — I639 Cerebral infarction, unspecified: Secondary | ICD-10-CM | POA: Insufficient documentation

## 2024-04-23 DIAGNOSIS — I5022 Chronic systolic (congestive) heart failure: Secondary | ICD-10-CM | POA: Diagnosis present

## 2024-04-23 DIAGNOSIS — I700 Atherosclerosis of aorta: Secondary | ICD-10-CM | POA: Insufficient documentation

## 2024-04-23 DIAGNOSIS — E1165 Type 2 diabetes mellitus with hyperglycemia: Secondary | ICD-10-CM | POA: Diagnosis not present

## 2024-04-23 MED ORDER — TECHNETIUM TC 99M TETROFOSMIN IV KIT
31.0000 | PACK | Freq: Once | INTRAVENOUS | Status: AC | PRN
  Administered 2024-04-23: 31 via INTRAVENOUS

## 2024-04-23 MED ORDER — REGADENOSON 0.4 MG/5ML IV SOLN
INTRAVENOUS | Status: AC
  Filled 2024-04-23: qty 5

## 2024-04-23 MED ORDER — REGADENOSON 0.4 MG/5ML IV SOLN
0.4000 mg | Freq: Once | INTRAVENOUS | Status: AC
  Administered 2024-04-23: 0.4 mg via INTRAVENOUS

## 2024-04-23 MED ORDER — TECHNETIUM TC 99M TETROFOSMIN IV KIT
10.5000 | PACK | Freq: Once | INTRAVENOUS | Status: AC | PRN
  Administered 2024-04-23: 10.5 via INTRAVENOUS

## 2024-04-26 ENCOUNTER — Ambulatory Visit (INDEPENDENT_AMBULATORY_CARE_PROVIDER_SITE_OTHER): Admitting: Family Medicine

## 2024-04-26 ENCOUNTER — Encounter: Payer: Self-pay | Admitting: Family Medicine

## 2024-04-26 ENCOUNTER — Ambulatory Visit: Payer: Self-pay | Admitting: Cardiology

## 2024-04-26 VITALS — BP 142/94 | HR 72 | Temp 96.7°F | Ht 66.0 in | Wt 160.6 lb

## 2024-04-26 DIAGNOSIS — I5020 Unspecified systolic (congestive) heart failure: Secondary | ICD-10-CM

## 2024-04-26 DIAGNOSIS — Z8673 Personal history of transient ischemic attack (TIA), and cerebral infarction without residual deficits: Secondary | ICD-10-CM

## 2024-04-26 DIAGNOSIS — E1165 Type 2 diabetes mellitus with hyperglycemia: Secondary | ICD-10-CM | POA: Diagnosis not present

## 2024-04-26 DIAGNOSIS — F320 Major depressive disorder, single episode, mild: Secondary | ICD-10-CM

## 2024-04-26 DIAGNOSIS — Z7984 Long term (current) use of oral hypoglycemic drugs: Secondary | ICD-10-CM

## 2024-04-26 DIAGNOSIS — E785 Hyperlipidemia, unspecified: Secondary | ICD-10-CM

## 2024-04-26 DIAGNOSIS — I2510 Atherosclerotic heart disease of native coronary artery without angina pectoris: Secondary | ICD-10-CM

## 2024-04-26 DIAGNOSIS — I10 Essential (primary) hypertension: Secondary | ICD-10-CM

## 2024-04-26 LAB — MYOCARDIAL PERFUSION IMAGING
LV dias vol: 142 mL (ref 62–150)
LV sys vol: 76 mL (ref 4.2–5.8)
Nuc Stress EF: 46 %
Peak HR: 102 {beats}/min
Rest HR: 68 {beats}/min
Rest Nuclear Isotope Dose: 10.5 mCi
SDS: 1
SRS: 4
SSS: 2
ST Depression (mm): 0 mm
Stress Nuclear Isotope Dose: 31 mCi
TID: 1.04

## 2024-04-26 MED ORDER — AMLODIPINE BESYLATE 5 MG PO TABS
5.0000 mg | ORAL_TABLET | Freq: Every day | ORAL | 3 refills | Status: AC
Start: 2024-04-26 — End: ?

## 2024-04-26 MED ORDER — DEXCOM G6 RECEIVER DEVI
0 refills | Status: DC
Start: 2024-04-26 — End: 2024-05-18

## 2024-04-26 MED ORDER — ASPIRIN 81 MG PO TBEC: 81.0000 mg | DELAYED_RELEASE_TABLET | Freq: Every day | ORAL | 3 refills | Status: DC

## 2024-04-26 NOTE — Assessment & Plan Note (Addendum)
 Compensated. Continue sacubitril -valsartan  (Entresto ) 24-26 mg bid. May benefit form a beta-blocker and spironolactone. I will have cardiology make that call.

## 2024-04-26 NOTE — Assessment & Plan Note (Signed)
 Continue insulin  glargine 16 units daily and sitagliptin  50 mg daily. I will check his A1c today.

## 2024-04-26 NOTE — Assessment & Plan Note (Signed)
 Continue atorvastatin 80 mg daily

## 2024-04-26 NOTE — Progress Notes (Signed)
 [facility] [address] [place] [address] Dept: [phone] Dept Fax: [phone]  Chronic Care Office Visit  Subjective:    Patient ID: Bruce Yang, male    DOB: 1971-03-31, 53 y.o..   MRN: [id number]  Chief Complaint  Patient presents with   Hypertension    Follow up, concerns with numbness in left forearm to hand   History of Present Illness:  Patient is in today for reassessment of chronic medical issues.  Mr. Bruce Yang suffered an acute right CVA on Jan 16 with numbness to his left face, arm, and leg.  He has had improvement in the use of his left arm and leg. He has been managed on aspirin  81 mg daily and clopidogrel  75 mg daily. He notes some persistent numbness of the left forearm/hand since the stroke.   Mr. Bruce Yang  has hypertension. He had been managed on amlodipine -benazepril  (Lotrel ) 5-20 mg daily. However, he recently saw Dr. Swaziland (cardiology) who switched him to sacubitril -valsartan  24-26 mg bid.    Mr. Bruce Yang has hyperlipidemia. He is managed on atorvastatin  80 mg, started in Jan.   Mr. Bruce Yang was seen by Dr. Berneta in April with new-onset diabetes. He is currently managed on insulin  glargine 16 units daily and sitagliptin  50 mg daily. He has now met with a diabetes educator. He was provided with a Dexcom 6 sensor and transmitter. However, his cell phone is broken, so he has no way to download data.  Mr. Bruce Yang has had issues with depression since his stroke. I started him on bupropion  XL 150 mg daily. He and his wife feel like his mood is doing better. He feels he can hold his anger in check better.  Mr. Bruce Yang tested positive for HIV in Jan. He is currently managed on dolutegravir -lamivudine  50-300 mg daily.  Past Medical History: Patient Active Problem List   Diagnosis Date Noted   Depression, major, single episode, mild (HCC) 04/06/2024   Tendinitis of right rotator cuff 04/06/2024    Acute arthritis- Left knee 04/06/2024   Uncontrolled type 2 diabetes mellitus with hyperglycemia (HCC) 02/02/2024   Cellulitis of right lower extremity 02/02/2024   History of CVA (cerebrovascular accident) 11/19/2023   Essential hypertension 10/21/2023   Erectile dysfunction 10/21/2023   Aortic atherosclerosis (HCC) 10/21/2023   Carotid artery disease (HCC) 10/21/2023   G6PD deficiency 10/17/2023   HIV disease (HCC) 10/15/2023   Therapeutic drug monitoring 10/15/2023   Alcohol use 10/14/2023   Substance abuse (HCC) 10/14/2023   Cardiac murmur 10/14/2023   Methamphetamine use (HCC) 10/14/2023   Acute CVA (cerebrovascular accident) (HCC) 10/13/2023   Spondylolisthesis, lumbosacral region 09/25/2023   Hyperlipidemia 08/20/2023   Hyperglycemia 08/19/2023   Chronic low back pain without sciatica 08/19/2023   Peripheral neuropathy 08/19/2023   Tobacco use disorder 08/19/2023   Marijuana use, continuous 08/19/2023   No past surgical history on file. Family History  Problem Relation Age of Onset   Other Mother        Homicide   Hypertension Maternal Aunt    Diabetes Maternal Aunt    Cancer Maternal Uncle        Multiple myeloma   Liver disease Neg Hx    Esophageal cancer Neg Hx    Colon cancer Neg Hx    Outpatient Medications Prior to Visit  Medication Sig Dispense Refill   acetaminophen  (TYLENOL ) 500 MG tablet Take 1-2 tablets (500-1,000 mg total) by mouth every 6 (six) hours as needed for mild pain (  pain score 1-3) or moderate pain (pain score 4-6).     atorvastatin  (LIPITOR ) 80 MG tablet Take 1 tablet (80 mg total) by mouth daily. 90 tablet 3   buPROPion  (WELLBUTRIN  XL) 150 MG 24 hr tablet Take 1 tablet (150 mg total) by mouth daily. 30 tablet 3   Continuous Glucose Sensor (DEXCOM G6 SENSOR) MISC As directed. 1 each 5   Continuous Glucose Transmitter (DEXCOM G6 TRANSMITTER) MISC 1 Units by Does not apply route every 14 (fourteen) days. 2 each 3   dolutegravir -lamiVUDine   (DOVATO ) 50-300 MG tablet Take 1 tablet by mouth daily. 30 tablet 11   doxycycline  (VIBRAMYCIN ) 100 MG capsule Take 1 capsule (100 mg total) by mouth 2 (two) times daily. 20 capsule 0   folic acid  (FOLVITE ) 1 MG tablet Take 1 tablet (1 mg total) by mouth daily. 30 tablet 1   insulin  glargine (LANTUS  SOLOSTAR) 100 UNIT/ML Solostar Pen Inject 16 Units into the skin daily. 15 mL 3   insulin  glargine (LANTUS ) 100 UNIT/ML Solostar Pen Inject 12 Units into the skin daily. 15 mL 0   Insulin  Pen Needle (INSUPEN PEN NEEDLES) 32G X 4 MM MISC Use once daily 100 each 0   meloxicam  (MOBIC ) 15 MG tablet TAKE 1 TABLET (15 MG TOTAL) BY MOUTH DAILY. 30 tablet 0   Multiple Vitamin (MULTIVITAMIN WITH MINERALS) TABS tablet Take 1 tablet by mouth daily.     naproxen  (NAPROSYN ) 500 MG tablet Take 1 tablet (500 mg total) by mouth 2 (two) times daily with a meal. 14 tablet 0   sacubitril -valsartan  (ENTRESTO ) 24-26 MG Take 1 tablet by mouth 2 (two) times daily. 60 tablet 6   sitaGLIPtin  (JANUVIA ) 50 MG tablet Take 1 tablet (50 mg total) by mouth daily. 90 tablet 3   tadalafil  (CIALIS ) 10 MG tablet Take 1 tablet (10 mg total) by mouth daily as needed for erectile dysfunction. 30 tablet 3   thiamine  (VITAMIN B1) 100 MG tablet Take 1 tablet (100 mg total) by mouth daily. 30 tablet 1   varenicline  (CHANTIX ) 1 MG tablet Take 1 tablet (1 mg total) by mouth 2 (two) times daily. 60 tablet 5   aspirin  EC 81 MG tablet Take 1 tablet (81 mg total) by mouth daily. Swallow whole. 90 tablet 3   clopidogrel  (PLAVIX ) 75 MG tablet Take 1 tablet (75 mg total) by mouth daily. 90 tablet 3   No facility-administered medications prior to visit.   Allergies  Allergen Reactions   Antihistamines, Chlorpheniramine-Type     Affects Prostate    Objective:   Today's Vitals   04/26/24 1548 04/26/24 1621  BP: (!) 152/102 (!) 142/94  Pulse: 72   Temp: (!) 96.7 F (35.9 C)   SpO2: 96%   Weight: 160 lb 9.6 oz (72.8 kg)   Height: 5' 6 (1.676  m)    Body mass index is 25.92 kg/m.   General: Well developed, well nourished. No acute distress. Feet- Skin intact. No sign of maceration between toes. Nails are normal. Dorsalis pedis and posterior tibial   artery pulses are normal. 5.07 monofilament testing normal. Psych: Alert and oriented. Normal mood and affect.  Health Maintenance Due  Topic Date Due   FOOT EXAM  Never done   OPHTHALMOLOGY EXAM  Never done   Diabetic kidney evaluation - Urine ACR  Never done   Hepatitis B Vaccines (1 of 3 - 19+ 3-dose series) Never done   Colonoscopy  Never done   Zoster Vaccines- Shingrix  (2 of  2) 04/08/2024   Imaging: Gated SPECT Myoperfusion Scan (04/23/2024)   Findings are consistent with infarction. The study is intermediate risk. based on EF   No ST deviation was noted.   LV perfusion is abnormal. There is no evidence of ischemia. There is evidence of infarction. Defect 1: There is a medium defect with moderate reduction in uptake present in the basal inferior location(s) that is fixed. There is abnormal wall motion in the defect area. Consistent with infarction.   Left ventricular function is abnormal. Global function is moderately reduced. There was a single regional abnormality. Nuclear stress EF: 46%. The left ventricular ejection fraction is mildly decreased (45-54%). End diastolic cavity size is mildly enlarged. End systolic cavity size is moderately enlarged.   CT images were obtained for attenuation correction and were examined for the presence of coronary calcium  when appropriate.   Coronary calcium  was present on the attenuation correction CT images. Moderate coronary calcifications were present. Coronary calcifications were present in the left anterior descending artery and left circumflex artery distribution(s).   EF disproportionately low compared to perfusion defect    Assessment & Plan:   Problem List Items Addressed This Visit       Cardiovascular and Mediastinum    Coronary artery disease   Recent myoperfusion scan shows evidence of a prior inferior MI. He should continue to follow with cardiology to determine if he may need a coronary cath to further assess or intervene.      Relevant Medications   amLODipine  (NORVASC ) 5 MG tablet   aspirin  EC 81 MG tablet   Essential hypertension - Primary   Blood pressure is elevated today. Cotninue sacubitril -valsartan  (Entresto ) 24-26 mg bid. I will add back the amlodipine  5 mg daily that was stopped recently.      Relevant Medications   amLODipine  (NORVASC ) 5 MG tablet   aspirin  EC 81 MG tablet   HFrEF (heart failure with reduced ejection fraction) (HCC)   Compensated. Continue sacubitril -valsartan  (Entresto ) 24-26 mg bid. May benefit form a beta-blocker and spironolactone. I will have cardiology make that call.      Relevant Medications   amLODipine  (NORVASC ) 5 MG tablet   aspirin  EC 81 MG tablet     Endocrine   Uncontrolled type 2 diabetes mellitus with hyperglycemia (HCC)   Continue insulin  glargine 16 units daily and sitagliptin  50 mg daily. I will check his A1c today.      Relevant Medications   Continuous Glucose Receiver (DEXCOM G6 RECEIVER) DEVI   aspirin  EC 81 MG tablet   Other Relevant Orders   Microalbumin / creatinine urine ratio   Glucose, random   Hemoglobin A1c     Other   Depression, major, single episode, mild (HCC)   Improved. Continue bupropion  150 mg XL daily.      History of CVA (cerebrovascular accident)   Residual decreased sensation in left forearm/hand. Continue aspirin  81 mg daily. May stop clopidogrel .      Relevant Medications   aspirin  EC 81 MG tablet   Hyperlipidemia   Continue atorvastatin  80 mg daily.      Relevant Medications   amLODipine  (NORVASC ) 5 MG tablet   aspirin  EC 81 MG tablet    Return in about 3 months (around 07/27/2024) for Reassessment.   Garnette CHRISTELLA Simpler, MD

## 2024-04-26 NOTE — Assessment & Plan Note (Signed)
 Blood pressure is elevated today. Cotninue sacubitril -valsartan  (Entresto ) 24-26 mg bid. I will add back the amlodipine  5 mg daily that was stopped recently.

## 2024-04-26 NOTE — Assessment & Plan Note (Signed)
 Residual decreased sensation in left forearm/hand. Continue aspirin  81 mg daily. May stop clopidogrel .

## 2024-04-26 NOTE — Assessment & Plan Note (Signed)
 Improved. Continue bupropion  150 mg XL daily.

## 2024-04-26 NOTE — Assessment & Plan Note (Signed)
 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 He should continue to follow with cardiology to determine if he may need a coronary cath to further assess or intervene.

## 2024-04-27 ENCOUNTER — Ambulatory Visit: Payer: Self-pay | Admitting: Family Medicine

## 2024-04-27 ENCOUNTER — Other Ambulatory Visit (HOSPITAL_COMMUNITY): Payer: Self-pay

## 2024-04-27 DIAGNOSIS — R809 Proteinuria, unspecified: Secondary | ICD-10-CM | POA: Insufficient documentation

## 2024-04-27 LAB — MICROALBUMIN / CREATININE URINE RATIO
Creatinine,U: 148.7 mg/dL
Microalb Creat Ratio: 104.2 mg/g — ABNORMAL HIGH (ref 0.0–30.0)
Microalb, Ur: 15.5 mg/dL — ABNORMAL HIGH (ref 0.0–1.9)

## 2024-04-27 LAB — HEMOGLOBIN A1C: Hgb A1c MFr Bld: 7.2 % — ABNORMAL HIGH (ref 4.6–6.5)

## 2024-04-27 LAB — GLUCOSE, RANDOM: Glucose, Bld: 170 mg/dL — ABNORMAL HIGH (ref 70–99)

## 2024-04-28 ENCOUNTER — Encounter: Payer: Self-pay | Admitting: Family Medicine

## 2024-04-28 DIAGNOSIS — R911 Solitary pulmonary nodule: Secondary | ICD-10-CM | POA: Insufficient documentation

## 2024-04-29 ENCOUNTER — Other Ambulatory Visit: Payer: Self-pay

## 2024-04-29 DIAGNOSIS — R911 Solitary pulmonary nodule: Secondary | ICD-10-CM

## 2024-04-29 NOTE — Progress Notes (Signed)
 Chest ct

## 2024-04-30 ENCOUNTER — Other Ambulatory Visit (HOSPITAL_COMMUNITY): Payer: Self-pay

## 2024-05-03 ENCOUNTER — Other Ambulatory Visit: Payer: Self-pay | Admitting: Physician Assistant

## 2024-05-03 ENCOUNTER — Other Ambulatory Visit: Payer: Self-pay

## 2024-05-03 ENCOUNTER — Other Ambulatory Visit (HOSPITAL_COMMUNITY): Payer: Self-pay

## 2024-05-04 ENCOUNTER — Other Ambulatory Visit: Payer: Self-pay

## 2024-05-04 NOTE — Progress Notes (Signed)
 Specialty Pharmacy Refill Coordination Note  SHERRIE Frith is a 53 y.o. male contacted today regarding refills of specialty medication(s) Dolutegravir -lamiVUDine  (Dovato )   Patient requested Marylyn at [facility] [place] date: 05/07/24   Medication will be filled on 05/06/24.

## 2024-05-05 ENCOUNTER — Other Ambulatory Visit: Payer: Self-pay

## 2024-05-06 ENCOUNTER — Ambulatory Visit (HOSPITAL_COMMUNITY)

## 2024-05-10 ENCOUNTER — Other Ambulatory Visit (HOSPITAL_BASED_OUTPATIENT_CLINIC_OR_DEPARTMENT_OTHER): Payer: Self-pay

## 2024-05-12 ENCOUNTER — Ambulatory Visit (HOSPITAL_COMMUNITY)

## 2024-05-13 DIAGNOSIS — E1165 Type 2 diabetes mellitus with hyperglycemia: Secondary | ICD-10-CM

## 2024-05-14 ENCOUNTER — Ambulatory Visit: Admitting: Internal Medicine

## 2024-05-14 ENCOUNTER — Other Ambulatory Visit (HOSPITAL_BASED_OUTPATIENT_CLINIC_OR_DEPARTMENT_OTHER): Payer: Self-pay

## 2024-05-18 ENCOUNTER — Other Ambulatory Visit (HOSPITAL_COMMUNITY): Payer: Self-pay

## 2024-05-18 ENCOUNTER — Other Ambulatory Visit: Payer: Self-pay

## 2024-05-18 ENCOUNTER — Other Ambulatory Visit (HOSPITAL_BASED_OUTPATIENT_CLINIC_OR_DEPARTMENT_OTHER): Payer: Self-pay

## 2024-05-18 ENCOUNTER — Telehealth: Payer: Self-pay

## 2024-05-18 DIAGNOSIS — Z419 Encounter for procedure for purposes other than remedying health state, unspecified: Secondary | ICD-10-CM | POA: Diagnosis not present

## 2024-05-18 MED ORDER — DEXCOM G6 RECEIVER DEVI
0 refills | Status: DC
  Filled 2024-05-18: qty 1, 90d supply, fill #0

## 2024-05-18 NOTE — Telephone Encounter (Signed)
 Pharmacy Patient Advocate Encounter   Received notification from Physicians office that prior authorization for Dexcom G6 Receiver  device is required/requested.   Insurance verification completed.   The patient is insured through [facility] [place] .   Per test claim: PA required; PA submitted to above mentioned insurance via Latent Key/confirmation #/EOC [id number]  Status is pending

## 2024-05-18 NOTE — Progress Notes (Signed)
 Specialty Pharmacy Ongoing Clinical Assessment Note  Bruce Yang is a 53 y.o. male who is being followed by the specialty pharmacy service for RxSp HIV   Patient's specialty medication(s) reviewed today: Dolutegravir -lamiVUDine  (Dovato )   Missed doses in the last 4 weeks: 0   Patient/Caregiver did not have any additional questions or concerns.   Therapeutic benefit summary: Patient is achieving benefit   Adverse events/side effects summary: No adverse events/side effects   Patient's therapy is appropriate to: Continue    Goals Addressed             This Visit's Progress    Achieve Undetectable HIV Viral Load < 20   Improving    Patient is not on track and improving. Patient will work on increased adherence. Patient's viral load as of 02/12/24 was 33 copies/mL         Follow up: 3 months  Silvano [name] Specialty Pharmacist

## 2024-05-18 NOTE — Telephone Encounter (Signed)
 Copied from CRM [id number]. Topic: Clinical - Prescription Issue >> May 18, 2024 12:49 PM Aisha D wrote: Reason for CRM: Pt's Fiance Daphane is requesting to speak with Karna or Dr.Rudd 's nurse in regards to the Dexcom medication. She stated that the pharmacy stated they are unable to fill the medication due to insurance. She would like a callback today if possible.

## 2024-05-18 NOTE — Telephone Encounter (Signed)
 [facility] if [facility] can fill it for them.  If not she will call back and we will send a message tot he PA team to to a PA. Dm/cma

## 2024-05-19 ENCOUNTER — Other Ambulatory Visit (HOSPITAL_BASED_OUTPATIENT_CLINIC_OR_DEPARTMENT_OTHER): Payer: Self-pay

## 2024-05-19 ENCOUNTER — Other Ambulatory Visit: Payer: Self-pay

## 2024-05-19 MED ORDER — DEXCOM G6 TRANSMITTER MISC
6 refills | Status: AC
  Filled 2024-05-19: qty 1, 90d supply, fill #0
  Filled 2024-07-13 – 2024-07-26 (×2): qty 1, 90d supply, fill #1
  Filled 2024-10-21: qty 1, 90d supply, fill #2

## 2024-05-19 NOTE — Progress Notes (Signed)
 Cardiology Office Note:  .   Date:  05/20/2024  ID:  Bruce Yang, DOB [birth year], MRN [id number] PCP: [name], MD  [facility] Providers Cardiologist:  Peter Swaziland, MD {  History of Present Illness: .   Bruce Yang is a 53 y.o. male with history of heart failure with mildly reduced EF, aortic stenosis, hypertension, diabetes, tobacco abuse, HIV, CVA, polysubstance abuse (tobacco, alcohol, amphetamines, previous cocaine).     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  Of note he has positive for methamphetamines history of polysubstance abuse as above and with high levels of ethanol in the system. Lexi scan 04/2024 with fixed inferior wall defect consistent with prior infarct/scar.  EF disproportionately low compared to perfusion defect.  No active ischemia.  LUL pulmonary nodule noted.  Social history  Used to be a very heavy drinker and smoker Currently smoking 1 pack/week.  Drinking a couple light beers occasionally.  No liquor Currently denying any drug use     Patient with history of mildly reduced EF in the setting of acute CVA and polysubstance abuse and tested positive for amphetamines and high levels ethanol at the time.  We saw him the first time 7/11 and stress test was ordered that showed prior fixed defect, no active ischemia.  Given he was asymptomatic no further imaging/evaluation was recommended.  Today patient presents for follow-up and accompanied with his girlfriend.  He reports no acute complaints.  He has been compliant with all of his medications and has made significant progress in his substance use and has decreased his consumption significantly.  Denies any methamphetamine use.  He is very active and often times on the elliptical machine 2-3 times a day working out for 20 to 30 minutes.  Loves to walk in has no exertional symptoms.  Only reports mild peripheral edema maybe every  other day.  ROS: Denies: Chest pain, shortness of breath, orthopnea, peripheral edema, palpitations, decreased exercise intolerance, fatigue, lightheadedness.   Studies Reviewed: .         Risk Assessment/Calculations:        Physical Exam:   VS:  BP (!) 150/76   Pulse 66   Ht 5' 6 (1.676 m)   Wt 163 lb (73.9 kg)   SpO2 97%   BMI 26.31 kg/m    Wt Readings from Last 3 Encounters:  05/20/24 163 lb (73.9 kg)  04/26/24 160 lb 9.6 oz (72.8 kg)  04/23/24 162 lb (73.5 kg)    GEN: Well nourished, well developed in no acute distress NECK: No JVD; No carotid bruits CARDIAC: RRR, no murmurs, rubs, gallops RESPIRATORY:  Clear to auscultation without rales, wheezing or rhonchi  ABDOMEN: Soft, non-tender, non-distended EXTREMITIES:  No edema; No deformity   ASSESSMENT AND PLAN: .     Heart failure with mildly reduced EF EF 40 to 45% in the setting of acute CVA 10/2023.  Etiology for this is extensive.  It could be related to polysubstance abuse with methamphetamines and significant ethanol levels noted when it was diagnosed.  Could also be stress-induced.  We did a Lexiscan  that showed no active ischemia, EF felt to be disproportionately low compared to fixed inferior defect.  No further ischemic evaluation recommended given he has been asymptomatic.  Overall has NYHA class I symptoms and euvolemic on today's exam. GDMT: He was reluctant to try any new medications but agreed with the following.  Increase Entresto  24-26  mg to 49-51 mg twice daily, start Jardiance  10 mg.  Has borderline bradycardia.  Check BMP today and again after 2 weeks Providing him Lasix  20 mg as needed for intermittent mild peripheral edema.  However the Jardiance  should help with this. Repeating echocardiogram in 2 to 3 months  Aortic stenosis Noted on echocardiogram 10/2023.  Mean gradient 13. Check echocardiogram 3 to 5 years  Polysubstance abuse Significantly has cut back his smoking and drinking.  Smoking 1  pack/week.  Drinking 2 beers occasionally.  Reports no methamphetamine use.  Continued to encourage cessation. Reports he is not taking Chantix . They say he was started on naproxen  for smoking cessation and depression which they say have helped with his mental health but not with smoking.  Not aware of this indication and since he is on aspirin  would not recommend he would be on this routinely.  Asked him to follow-up with PCP  HIV Followed by infectious disease.  Pulmonary nodule Pending CT of the chest, we will help reschedule  Hypertension Blood pressure 150/76. Manage in the context above.  Also on amlodipine  5 mg. Asked him to log his blood pressure once to twice a day for the next 2 weeks and to send that via MyChart messaging  CVA Hyperlipidemia With history of stroke and probably CAD LDL goal should be less than 55.  January 2025 it was 208. Continue with atorvastatin  80 mg, repeat lipid panel today.  If not at target therapy likely will need PCSK9 inhibitor.  We removed doxycycline  and duplicate medications off of his list as well as Chantix .  Dispo: Follow-up in 2 months with Glendia Ferrier (but I will see) for further titration of GDMT.  Signed, Thom LITTIE Sluder, PA-C

## 2024-05-19 NOTE — Telephone Encounter (Signed)
 Pharmacy Patient Advocate Encounter  Received notification from [facility] Medicaid that Prior Authorization for  has been  Dexcom G6 Receiver  device APPROVED from 05/04/24 to 11/14/24   PA #/Case ID/Reference #: [id number]

## 2024-05-20 ENCOUNTER — Ambulatory Visit: Attending: General Practice | Admitting: Cardiology

## 2024-05-20 ENCOUNTER — Other Ambulatory Visit (HOSPITAL_BASED_OUTPATIENT_CLINIC_OR_DEPARTMENT_OTHER): Payer: Self-pay

## 2024-05-20 ENCOUNTER — Ambulatory Visit: Payer: BLUE CROSS/BLUE SHIELD | Admitting: Neurology

## 2024-05-20 ENCOUNTER — Telehealth: Payer: Self-pay

## 2024-05-20 VITALS — BP 150/76 | HR 66 | Ht 66.0 in | Wt 163.0 lb

## 2024-05-20 DIAGNOSIS — E7800 Pure hypercholesterolemia, unspecified: Secondary | ICD-10-CM

## 2024-05-20 DIAGNOSIS — I5022 Chronic systolic (congestive) heart failure: Secondary | ICD-10-CM

## 2024-05-20 DIAGNOSIS — R911 Solitary pulmonary nodule: Secondary | ICD-10-CM

## 2024-05-20 DIAGNOSIS — F17200 Nicotine dependence, unspecified, uncomplicated: Secondary | ICD-10-CM

## 2024-05-20 DIAGNOSIS — I10 Essential (primary) hypertension: Secondary | ICD-10-CM | POA: Diagnosis not present

## 2024-05-20 DIAGNOSIS — F1090 Alcohol use, unspecified, uncomplicated: Secondary | ICD-10-CM

## 2024-05-20 MED ORDER — METFORMIN HCL 500 MG PO TABS: 500.0000 mg | ORAL_TABLET | Freq: Two times a day (BID) | ORAL | 1 refills | Status: DC

## 2024-05-20 MED ORDER — EMPAGLIFLOZIN 10 MG PO TABS
10.0000 mg | ORAL_TABLET | Freq: Every day | ORAL | 11 refills | Status: DC
  Filled 2024-05-20: qty 30, 30d supply, fill #0
  Filled 2024-06-14: qty 30, 30d supply, fill #1
  Filled 2024-08-03: qty 30, 30d supply, fill #2
  Filled 2024-09-04: qty 30, 30d supply, fill #3
  Filled 2024-10-04: qty 30, 30d supply, fill #4

## 2024-05-20 MED ORDER — AMLODIPINE BESY-BENAZEPRIL HCL 5-10 MG PO CAPS: 1.0000 | ORAL_CAPSULE | Freq: Every day | ORAL | 0 refills | Status: DC

## 2024-05-20 MED ORDER — INSULIN GLARGINE-YFGN 100 UNIT/ML ~~LOC~~ SOPN
12.0000 [IU] | PEN_INJECTOR | Freq: Every day | SUBCUTANEOUS | 3 refills | Status: DC
  Filled 2024-09-03: qty 9, 75d supply, fill #0

## 2024-05-20 MED ORDER — BUPROPION HCL ER (SR) 150 MG PO TB12
ORAL_TABLET | ORAL | 2 refills | Status: DC
  Filled 2024-08-03: qty 60, 30d supply, fill #0
  Filled 2024-09-04: qty 60, 30d supply, fill #1

## 2024-05-20 MED ORDER — SACUBITRIL-VALSARTAN 49-51 MG PO TABS
1.0000 | ORAL_TABLET | Freq: Two times a day (BID) | ORAL | 11 refills | Status: AC
  Filled 2024-05-20 – 2024-05-21 (×2): qty 60, 30d supply, fill #0
  Filled 2024-06-14: qty 60, 30d supply, fill #1
  Filled 2024-08-03: qty 60, 30d supply, fill #2
  Filled 2024-09-04: qty 60, 30d supply, fill #3
  Filled 2024-10-04: qty 60, 30d supply, fill #4

## 2024-05-20 MED ORDER — AMLODIPINE BESYLATE 5 MG PO TABS: 5.0000 mg | ORAL_TABLET | Freq: Every day | ORAL | 3 refills | Status: DC

## 2024-05-20 MED ORDER — INSULIN GLARGINE 100 UNIT/ML SOLOSTAR PEN: PEN_INJECTOR | SUBCUTANEOUS | 11 refills | Status: DC

## 2024-05-20 MED ORDER — FUROSEMIDE 20 MG PO TABS
20.0000 mg | ORAL_TABLET | ORAL | 3 refills | Status: AC | PRN
  Filled 2024-05-20: qty 90, 90d supply, fill #0

## 2024-05-20 MED ORDER — ASPIRIN 81 MG PO TBEC: 81.0000 mg | DELAYED_RELEASE_TABLET | Freq: Every day | ORAL | 3 refills | Status: DC

## 2024-05-20 MED ORDER — VARENICLINE TARTRATE 1 MG PO TABS: 1.0000 mg | ORAL_TABLET | Freq: Two times a day (BID) | ORAL | 5 refills | Status: DC

## 2024-05-20 MED ORDER — DEXCOM G6 SENSOR MISC
5 refills | Status: AC
  Filled 2024-07-13 – 2024-08-03 (×2): qty 3, 30d supply, fill #0
  Filled 2024-08-31: qty 3, 30d supply, fill #1
  Filled 2024-10-01: qty 3, 30d supply, fill #2
  Filled 2024-11-03: qty 3, 30d supply, fill #3

## 2024-05-20 MED ORDER — EMPAGLIFLOZIN 10 MG PO TABS: 10.0000 mg | ORAL_TABLET | Freq: Every day | ORAL | 0 refills | Status: DC

## 2024-05-20 MED ORDER — AMLODIPINE BESY-BENAZEPRIL HCL 5-20 MG PO CAPS: 1.0000 | ORAL_CAPSULE | Freq: Every day | ORAL | 3 refills | Status: DC

## 2024-05-20 MED ORDER — DEXCOM G6 RECEIVER DEVI
0 refills | Status: AC
Start: 2024-04-26 — End: ?
  Filled 2024-08-11: qty 1, 90d supply, fill #0
  Filled 2024-08-11: qty 1, 30d supply, fill #0
  Filled 2024-08-14: qty 1, 365d supply, fill #0
  Filled 2024-08-27: qty 1, 90d supply, fill #0

## 2024-05-20 MED ORDER — ATORVASTATIN CALCIUM 80 MG PO TABS
80.0000 mg | ORAL_TABLET | Freq: Every day | ORAL | 3 refills | Status: AC
  Filled 2024-05-20: qty 90, 90d supply, fill #0
  Filled 2024-08-14: qty 90, 90d supply, fill #1

## 2024-05-20 MED ORDER — CLOPIDOGREL BISULFATE 75 MG PO TABS
75.0000 mg | ORAL_TABLET | Freq: Every day | ORAL | 1 refills | Status: DC
Start: 2023-11-19 — End: 2024-07-26

## 2024-05-20 MED ORDER — TADALAFIL 10 MG PO TABS: 10.0000 mg | ORAL_TABLET | ORAL | 3 refills | Status: DC | PRN

## 2024-05-20 MED ORDER — SITAGLIPTIN PHOSPHATE 50 MG PO TABS: 50.0000 mg | ORAL_TABLET | Freq: Every day | ORAL | 3 refills | Status: DC

## 2024-05-20 NOTE — Patient Instructions (Addendum)
 Medication Instructions:  Your physician has recommended you make the following change in your medication:  INCREASE  ENTRESTO  TO 49-51 MG TWICE DAILY  2. START JARDIANCE  10 MG DAILY  3. START LASIX  20 MG AS NEEDED *If you need a refill on your cardiac medications before your next appointment, please call your pharmacy*  Lab Work: TODAY: BMET, LIPIDS  TO BE DONE IN 2 WEEKS: BMET   .If you have labs (blood work) drawn today and your tests are completely normal, you will receive your results only by: MyChart Message (if you have MyChart) OR A paper copy in the mail If you have any lab test that is abnormal or we need to change your treatment, we will call you to review the results.  Testing/Procedures: Your physician has requested that you have an echocardiogram. Echocardiography is a painless test that uses sound waves to create images of your heart. It provides your doctor with information about the size and shape of your heart and how well your heart's chambers and valves are working. This procedure takes approximately one hour. There are no restrictions for this procedure. TO BE DONE IN 2-3 MONTHS  Please do NOT wear cologne, perfume, aftershave, or lotions (deodorant is allowed). Please arrive 15 minutes prior to your appointment time.  Please note: We ask at that you not bring children with you during ultrasound (echo/ vascular) testing. Due to room size and safety concerns, children are not allowed in the ultrasound rooms during exams. Our front office staff cannot provide observation of children in our lobby area while testing is being conducted. An adult accompanying a patient to their appointment will only be allowed in the ultrasound room at the discretion of the ultrasound technician under special circumstances. We apologize for any inconvenience.   Non-Cardiac CT scanning, (CAT scanning), is a noninvasive, special x-ray that produces cross-sectional images of the body using x-rays  and a computer. CT scans help physicians diagnose and treat medical conditions. For some CT exams, a contrast material is used to enhance visibility in the area of the body being studied. CT scans provide greater clarity and reveal more details than regular x-ray exams. NEEDS TO BE SCHEDULED   Follow-Up: At [facility], you and your health needs are our priority.  As part of our continuing mission to provide you with exceptional heart care, our providers are all part of one team.  This team includes your primary Cardiologist (physician) and Advanced Practice Providers or APPs (Physician Assistants and Nurse Practitioners) who all work together to provide you with the care you need, when you need it.  Your next appointment:   2 month(s)  Provider:   Peter Swaziland, MD   Other Instructions Please check your blood pressure 1-2 times per day for 2 weeks and send readings to [facility], PA-C.

## 2024-05-20 NOTE — Telephone Encounter (Signed)
 Patient did get the receiver yesterday. Dm/cma

## 2024-05-20 NOTE — Telephone Encounter (Signed)
 Medication name/dosage: Samples List: Jardiance  10 mg  Administration instructions:   Reason for samples: Reason for samples: new start  Ordering provider: Thom Sluder, PA-C  *Once above information entered, route the phone encounter to CV DIV MAG ST SAMPLES and send Teams message to team member assigned to Samples for the day.

## 2024-05-20 NOTE — Telephone Encounter (Signed)
 Jardiance  10 mg by mouth daily samples is ready for a patient to pick up.

## 2024-05-21 ENCOUNTER — Other Ambulatory Visit: Payer: Self-pay

## 2024-05-21 ENCOUNTER — Other Ambulatory Visit (HOSPITAL_COMMUNITY): Payer: Self-pay

## 2024-05-21 ENCOUNTER — Other Ambulatory Visit (HOSPITAL_BASED_OUTPATIENT_CLINIC_OR_DEPARTMENT_OTHER): Payer: Self-pay

## 2024-05-21 ENCOUNTER — Ambulatory Visit: Payer: Self-pay | Admitting: Cardiology

## 2024-05-21 DIAGNOSIS — I10 Essential (primary) hypertension: Secondary | ICD-10-CM

## 2024-05-21 DIAGNOSIS — E7800 Pure hypercholesterolemia, unspecified: Secondary | ICD-10-CM

## 2024-05-21 DIAGNOSIS — I5022 Chronic systolic (congestive) heart failure: Secondary | ICD-10-CM

## 2024-05-21 LAB — BASIC METABOLIC PANEL WITH GFR
BUN/Creatinine Ratio: 16 (ref 9–20)
BUN: 14 mg/dL (ref 6–24)
CO2: 22 mmol/L (ref 20–29)
Calcium: 9.8 mg/dL (ref 8.7–10.2)
Chloride: 101 mmol/L (ref 96–106)
Creatinine, Ser: 0.88 mg/dL (ref 0.76–1.27)
Glucose: 144 mg/dL — ABNORMAL HIGH (ref 70–99)
Potassium: 5 mmol/L (ref 3.5–5.2)
Sodium: 136 mmol/L (ref 134–144)
eGFR: 102 mL/min/1.73 (ref >59)

## 2024-05-21 LAB — LIPID PANEL
Chol/HDL Ratio: 3.8 ratio (ref 0.0–5.0)
Cholesterol, Total: 263 mg/dL — ABNORMAL HIGH (ref 100–199)
HDL: 69 mg/dL (ref >39)
LDL Chol Calc (NIH): 180 mg/dL — ABNORMAL HIGH (ref 0–99)
Triglycerides: 83 mg/dL (ref 0–149)
VLDL Cholesterol Cal: 14 mg/dL (ref 5–40)

## 2024-05-24 ENCOUNTER — Other Ambulatory Visit (HOSPITAL_COMMUNITY): Payer: Self-pay

## 2024-05-27 ENCOUNTER — Ambulatory Visit (HOSPITAL_COMMUNITY)
Admission: RE | Admit: 2024-05-27 | Discharge: 2024-05-27 | Disposition: A | Discharge status: Home / Self Care | Source: Ambulatory Visit | Attending: Cardiology | Admitting: Cardiology

## 2024-05-27 DIAGNOSIS — R911 Solitary pulmonary nodule: Secondary | ICD-10-CM | POA: Diagnosis present

## 2024-06-04 ENCOUNTER — Other Ambulatory Visit: Payer: Self-pay

## 2024-06-04 NOTE — Progress Notes (Signed)
 Specialty Pharmacy Refill Coordination Note  SHERRIE Dehaas is a 53 y.o. male contacted today regarding refills of specialty medication(s) Dolutegravir -lamiVUDine  (Dovato )   Patient requested Marylyn at [facility] at [place] date: 06/08/24   Medication will be filled on 06/04/24.

## 2024-06-06 ENCOUNTER — Other Ambulatory Visit: Payer: Self-pay | Admitting: Physician Assistant

## 2024-06-08 ENCOUNTER — Other Ambulatory Visit (HOSPITAL_BASED_OUTPATIENT_CLINIC_OR_DEPARTMENT_OTHER): Payer: Self-pay

## 2024-06-09 ENCOUNTER — Ambulatory Visit (INDEPENDENT_AMBULATORY_CARE_PROVIDER_SITE_OTHER): Admitting: Neurology

## 2024-06-09 VITALS — BP 156/86 | HR 80 | Ht 66.0 in | Wt 161.0 lb

## 2024-06-09 DIAGNOSIS — F17200 Nicotine dependence, unspecified, uncomplicated: Secondary | ICD-10-CM

## 2024-06-09 DIAGNOSIS — I639 Cerebral infarction, unspecified: Secondary | ICD-10-CM | POA: Diagnosis not present

## 2024-06-09 DIAGNOSIS — I10 Essential (primary) hypertension: Secondary | ICD-10-CM

## 2024-06-09 DIAGNOSIS — E1165 Type 2 diabetes mellitus with hyperglycemia: Secondary | ICD-10-CM

## 2024-06-09 DIAGNOSIS — I5020 Unspecified systolic (congestive) heart failure: Secondary | ICD-10-CM

## 2024-06-09 DIAGNOSIS — Z794 Long term (current) use of insulin: Secondary | ICD-10-CM

## 2024-06-09 NOTE — Patient Instructions (Addendum)
 Restart aspirin  81 mg daily  Strict management of vascular risk factors with a goal BP less than 130/90, A1c less than 7.0, LDL less than 70 for secondary stroke prevention Follow up with cardiology about cholesterol Keep a log of blood pressure, discuss with cardiology  Recommend against smoking, substance, alcohol use Referral to PT/OT
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
## 2024-06-10 ENCOUNTER — Ambulatory Visit: Admitting: Dietician

## 2024-06-11 ENCOUNTER — Ambulatory Visit: Payer: Self-pay | Admitting: Cardiology

## 2024-06-14 ENCOUNTER — Ambulatory Visit: Payer: Self-pay | Admitting: Dietician

## 2024-06-14 ENCOUNTER — Other Ambulatory Visit (HOSPITAL_BASED_OUTPATIENT_CLINIC_OR_DEPARTMENT_OTHER): Payer: Self-pay

## 2024-06-15 ENCOUNTER — Other Ambulatory Visit: Payer: Self-pay

## 2024-06-15 DIAGNOSIS — R911 Solitary pulmonary nodule: Secondary | ICD-10-CM

## 2024-06-16 ENCOUNTER — Ambulatory Visit: Admitting: Internal Medicine

## 2024-06-18 DIAGNOSIS — Z419 Encounter for procedure for purposes other than remedying health state, unspecified: Secondary | ICD-10-CM | POA: Diagnosis not present

## 2024-06-28 ENCOUNTER — Encounter: Payer: Self-pay | Admitting: Occupational Therapy

## 2024-06-28 ENCOUNTER — Ambulatory Visit: Attending: Neurology | Admitting: Physical Therapy

## 2024-06-28 ENCOUNTER — Other Ambulatory Visit: Payer: Self-pay

## 2024-06-28 ENCOUNTER — Encounter: Payer: Self-pay | Admitting: Physical Therapy

## 2024-06-28 ENCOUNTER — Ambulatory Visit: Admitting: Occupational Therapy

## 2024-06-28 DIAGNOSIS — R41840 Attention and concentration deficit: Secondary | ICD-10-CM

## 2024-06-28 DIAGNOSIS — R278 Other lack of coordination: Secondary | ICD-10-CM | POA: Diagnosis present

## 2024-06-28 DIAGNOSIS — R41844 Frontal lobe and executive function deficit: Secondary | ICD-10-CM | POA: Diagnosis present

## 2024-06-28 DIAGNOSIS — M6281 Muscle weakness (generalized): Secondary | ICD-10-CM | POA: Insufficient documentation

## 2024-06-28 DIAGNOSIS — M79602 Pain in left arm: Secondary | ICD-10-CM | POA: Insufficient documentation

## 2024-06-28 DIAGNOSIS — R2689 Other abnormalities of gait and mobility: Secondary | ICD-10-CM

## 2024-06-28 DIAGNOSIS — I639 Cerebral infarction, unspecified: Secondary | ICD-10-CM | POA: Diagnosis not present
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
## 2024-07-05 ENCOUNTER — Other Ambulatory Visit: Payer: Self-pay

## 2024-07-05 NOTE — Progress Notes (Signed)
 Specialty Pharmacy Refill Coordination Note  SHERRIE Schroeter is a 53 y.o. male contacted today regarding refills of specialty medication(s) Dolutegravir -lamiVUDine  (Dovato )  Spoke with patient's spouse  Patient requested Pickup at [facility] at [place] date: 07/08/24   Medication will be filled on 10.01.25.

## 2024-07-06 ENCOUNTER — Other Ambulatory Visit: Payer: Self-pay

## 2024-07-06 ENCOUNTER — Other Ambulatory Visit (HOSPITAL_BASED_OUTPATIENT_CLINIC_OR_DEPARTMENT_OTHER): Payer: Self-pay

## 2024-07-06 MED ORDER — SULFAMETHOXAZOLE-TRIMETHOPRIM 800-160 MG PO TABS
1.0000 | ORAL_TABLET | Freq: Two times a day (BID) | ORAL | 0 refills | Status: AC
  Filled 2024-07-06: qty 20, 10d supply, fill #0

## 2024-07-06 MED ORDER — HYDROCODONE-ACETAMINOPHEN 5-325 MG PO TABS
1.0000 | ORAL_TABLET | Freq: Three times a day (TID) | ORAL | 0 refills | Status: AC | PRN
  Filled 2024-07-06: qty 6, 2d supply, fill #0

## 2024-07-13 ENCOUNTER — Other Ambulatory Visit (HOSPITAL_BASED_OUTPATIENT_CLINIC_OR_DEPARTMENT_OTHER): Payer: Self-pay

## 2024-07-18 DIAGNOSIS — Z419 Encounter for procedure for purposes other than remedying health state, unspecified: Secondary | ICD-10-CM | POA: Diagnosis not present

## 2024-07-19 ENCOUNTER — Encounter: Payer: Self-pay | Admitting: Internal Medicine

## 2024-07-19 ENCOUNTER — Other Ambulatory Visit (HOSPITAL_COMMUNITY): Payer: Self-pay

## 2024-07-19 ENCOUNTER — Ambulatory Visit: Admitting: Internal Medicine

## 2024-07-19 ENCOUNTER — Other Ambulatory Visit: Payer: Self-pay

## 2024-07-19 VITALS — BP 113/73 | HR 88 | Temp 98.8°F | Ht 65.0 in | Wt 160.0 lb

## 2024-07-19 DIAGNOSIS — F1210 Cannabis abuse, uncomplicated: Secondary | ICD-10-CM | POA: Diagnosis not present

## 2024-07-19 DIAGNOSIS — Z23 Encounter for immunization: Secondary | ICD-10-CM

## 2024-07-19 DIAGNOSIS — B20 Human immunodeficiency virus [HIV] disease: Secondary | ICD-10-CM

## 2024-07-19 DIAGNOSIS — Z87891 Personal history of nicotine dependence: Secondary | ICD-10-CM

## 2024-07-19 DIAGNOSIS — Z79899 Other long term (current) drug therapy: Secondary | ICD-10-CM

## 2024-07-19 MED ORDER — ZOSTER VAC RECOMB ADJUVANTED 50 MCG/0.5ML IM SUSR
0.5000 mL | Freq: Once | INTRAMUSCULAR | 0 refills | Status: AC
  Filled 2024-07-19 (×2): qty 0.5, 1d supply, fill #0

## 2024-07-19 MED ORDER — SILDENAFIL CITRATE 50 MG PO TABS
50.0000 mg | ORAL_TABLET | Freq: Every day | ORAL | 5 refills | Status: AC | PRN
  Filled 2024-07-19: qty 10, 10d supply, fill #0
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
## 2024-07-19 NOTE — Patient Instructions (Addendum)
 Vaccine: Flu/covid [place]    Next visit will start hepatitis b series vaccination   Continue dovato  today   Chart forwarded to our pharmacy team to get you on cabenuva    See me in 6 months, but pharmacy will get you on cabenuva sooner if qualified      Smoking Cessation: [employer]uitline[place] 1-800-QUIT-NOW [phone]) [phone]); Espaol: 1-855-Djelo-Ya (1-(330)657-9986) [email or web address]

## 2024-07-20 ENCOUNTER — Telehealth: Payer: Self-pay

## 2024-07-20 ENCOUNTER — Ambulatory Visit: Admitting: Occupational Therapy

## 2024-07-20 ENCOUNTER — Other Ambulatory Visit (HOSPITAL_COMMUNITY): Payer: Self-pay

## 2024-07-20 ENCOUNTER — Ambulatory Visit: Attending: Physician Assistant | Admitting: Cardiology

## 2024-07-20 ENCOUNTER — Encounter: Payer: Self-pay | Admitting: Occupational Therapy

## 2024-07-20 ENCOUNTER — Other Ambulatory Visit (HOSPITAL_BASED_OUTPATIENT_CLINIC_OR_DEPARTMENT_OTHER): Payer: Self-pay

## 2024-07-20 ENCOUNTER — Ambulatory Visit: Attending: Neurology

## 2024-07-20 ENCOUNTER — Other Ambulatory Visit: Payer: Self-pay

## 2024-07-20 ENCOUNTER — Encounter: Payer: Self-pay | Admitting: Physician Assistant

## 2024-07-20 VITALS — BP 131/76 | HR 75 | Ht 65.0 in | Wt 162.0 lb

## 2024-07-20 DIAGNOSIS — R41844 Frontal lobe and executive function deficit: Secondary | ICD-10-CM

## 2024-07-20 DIAGNOSIS — I10 Essential (primary) hypertension: Secondary | ICD-10-CM

## 2024-07-20 DIAGNOSIS — R278 Other lack of coordination: Secondary | ICD-10-CM | POA: Insufficient documentation

## 2024-07-20 DIAGNOSIS — I6389 Other cerebral infarction: Secondary | ICD-10-CM | POA: Diagnosis present

## 2024-07-20 DIAGNOSIS — M79601 Pain in right arm: Secondary | ICD-10-CM | POA: Insufficient documentation

## 2024-07-20 DIAGNOSIS — R41840 Attention and concentration deficit: Secondary | ICD-10-CM | POA: Insufficient documentation

## 2024-07-20 DIAGNOSIS — R2689 Other abnormalities of gait and mobility: Secondary | ICD-10-CM | POA: Diagnosis present

## 2024-07-20 DIAGNOSIS — E7800 Pure hypercholesterolemia, unspecified: Secondary | ICD-10-CM

## 2024-07-20 DIAGNOSIS — M6281 Muscle weakness (generalized): Secondary | ICD-10-CM | POA: Insufficient documentation

## 2024-07-20 DIAGNOSIS — F17200 Nicotine dependence, unspecified, uncomplicated: Secondary | ICD-10-CM | POA: Diagnosis not present

## 2024-07-20 DIAGNOSIS — I5020 Unspecified systolic (congestive) heart failure: Secondary | ICD-10-CM | POA: Diagnosis not present

## 2024-07-20 DIAGNOSIS — M79602 Pain in left arm: Secondary | ICD-10-CM | POA: Diagnosis present

## 2024-07-20 DIAGNOSIS — F1090 Alcohol use, unspecified, uncomplicated: Secondary | ICD-10-CM

## 2024-07-20 NOTE — Patient Instructions (Signed)
  Strengthening: Resisted Flexion   Hold tubing with __right___ arm(s) at side. Pull forward and up. Move shoulder through pain-free range of motion. Repeat __10__ times per set.  Do _1-2_ sessions per day , every other day   Strengthening: Resisted Extension   Hold tubing in _right____ hand(s), arm forward. Pull arm back, elbow straight. Repeat _10___ times per set. Do _1-2___ sessions per day, every other day.     Elbow Flexion: Resisted   With tubing held in __right____ hand(s) and other end secured under foot, curl arm up as far as possible. Repeat _10___ times per set. Do _1-2___ sessions per day, every other day.    Elbow Extension: Resisted   Sit in chair with resistive band secured at armrest (or hold with other hand) and ___right____ elbow bent. Straighten elbow. Repeat _10___ times per set.  Do _1-2___ sessions per day, every other day.   Copyright  VHI. All rights reserved.    1. Grip Strengthening (Resistive Putty)   Squeeze putty using thumb and all fingers. Repeat _20___ times. Do __2__ sessions per day.  Finger / Thumb Activities: Extension    Roll putty into rope shape using all fingers held straight. Hitchhike with thumb up and out.  Copyright  VHI. All rights reserved.    Pinch: Palmar    Pinch putty with right thumb and each fingertip in turn. Repeat __10__ times. Do __1__ sessions per day. Activity: Peel fruit such as lemons or oranges.* Peel stickers off surfaces.  Copyright  VHI. All rights reserved.     Copyright  VHI. All rights reserved.

## 2024-07-20 NOTE — Telephone Encounter (Signed)
 Submitted a Prior Authorization request to [employer] for [place] via Latent. Will update once we receive a response.  J Code: CPT:  PA ID: [id number]

## 2024-07-20 NOTE — Patient Instructions (Signed)
 Medication Instructions:  Your physician recommends that you continue on your current medications as directed. Please refer to the Current Medication list given to you today. *If you need a refill on your cardiac medications before your next appointment, please call your pharmacy*  Lab Work: None Ordered If you have labs (blood work) drawn today and your tests are completely normal, you will receive your results only by: MyChart Message (if you have MyChart) OR A paper copy in the mail If you have any lab test that is abnormal or we need to change your treatment, we will call you to review the results.  Testing/Procedures: None ordered  Follow-Up: At [facility], you and your health needs are our priority.  As part of our continuing mission to provide you with exceptional heart care, our providers are all part of one team.  This team includes your primary Cardiologist (physician) and Advanced Practice Providers or APPs (Physician Assistants and Nurse Practitioners) who all work together to provide you with the care you need, when you need it.  Your next appointment:   6 month(s)  Provider:   Peter Swaziland, MD or Thom Sluder, [place]  We recommend signing up for the patient portal called MyChart.  Sign up information is provided on this After Visit Summary.  MyChart is used to connect with patients for Virtual Visits (Telemedicine).  Patients are able to view lab/test results, encounter notes, upcoming appointments, etc.  Non-urgent messages can be sent to your provider as well.   To learn more about what you can do with MyChart, go to [email or web address].   Other Instructions

## 2024-07-20 NOTE — Progress Notes (Signed)
 Cardiology Office Note:  .   Date:  07/20/2024  ID:  Bruce Yang, DOB 1970/11/25, MRN [id number] PCP: [name], MD  [facility] Providers Cardiologist:  Peter Swaziland, MD {  History of Present Illness: .   Bruce Yang is a 53 y.o. male with history of heart failure with mildly reduced EF, aortic stenosis, hypertension, diabetes, HIV, CVA, hx of polysubstance abuse (tobacco, alcohol, amphetamines, previous cocaine).     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  Of note positive for methamphetamines. History of polysubstance abuse as above and with high levels of ethanol  his system. Lexi scan 04/2024 with fixed inferior wall defect consistent with prior infarct/scar.  EF disproportionately low compared to perfusion defect.  No active ischemia.  LUL pulmonary nodule noted.   Social history  Used to be a very heavy drinker and smoker Currently smoking 1 pack/week.  Drinking a couple light beers occasionally.  No liquor Currently denying any drug use He is very active and often times on the elliptical machine 2-3 times a day working out for 20 to 30 minutes. Loves to walk      Patient with history of mildly reduced EF in the setting of acute CVA and polysubstance abuse and tested positive for amphetamines and high levels ethanol at the time.  We saw him the first time 04/16/2024 and stress test was ordered that showed prior fixed defect, no active ischemia.  Given he was asymptomatic no further imaging/evaluation was recommended.  Patient was last seen 05/2024 and overall doing well with no acute complaints and compliant with all of his medications.  Reported significant decrease in smoking and drinking.  Slightly reluctant about new medications but was willing to increase Entresto  to 49-51 mg twice daily and Jardiance  was started.  Today patient presents for follow-up.  He has no acute complaints today and has  been very happy with his current regiment and has been tolerating all of his medications well.  Blood pressure has improved and has been within normal limits.  Has had no issues whatsoever from a volume standpoint and denied any peripheral edema, orthopnea.  Continues to deny any drug use.  He is still smoking and drinking about the same amount as last time.  Still working towards cessation.  Additionally he reports persistent sinus congestion after recently getting his tooth pulled.  He will follow-up with PCP.  ROS: Denies: Chest pain, shortness of breath, orthopnea, peripheral edema, palpitations, decreased exercise intolerance, fatigue, lightheadedness.   Studies Reviewed: .         Risk Assessment/Calculations:             Physical Exam:   VS:  BP 131/76 (BP Location: Right Arm, Patient Position: Sitting, Cuff Size: Normal)   Pulse 75   Ht 5' 5 (1.651 m)   Wt 162 lb (73.5 kg)   SpO2 97%   BMI 26.96 kg/m    Wt Readings from Last 3 Encounters:  07/20/24 162 lb (73.5 kg)  07/19/24 160 lb (72.6 kg)  06/09/24 161 lb (73 kg)    GEN: Well nourished, well developed in no acute distress NECK: No JVD; No carotid bruits CARDIAC: 3/6 murmur left sternal border, no murmurs, rubs, gallops RESPIRATORY:  Clear to auscultation without rales, wheezing or rhonchi  ABDOMEN: Soft, non-tender, non-distended EXTREMITIES:  No edema; No deformity   ASSESSMENT AND PLAN: .    Heart failure with mildly reduced  EF EF 40 to 45% in the setting of acute CVA 10/2023.  Etiology could be related to polysubstance abuse with methamphetamines and significant ethanol levels noted when it was diagnosed.  Could also be stress-induced.  We did a Lexiscan  that showed no active ischemia, EF felt to be disproportionately low compared to fixed inferior defect.  No further ischemic evaluation recommended given he has been asymptomatic.  Overall has NYHA class I symptoms and euvolemic on today's exam. GDMT: Continue  Entresto  49-51 mg twice daily, Jardiance  10 mg.  Continue as needed Lasix  20 mg, never needs this.  He would like to be on as few medications as possible so we will defer beta-blocker for now Echocardiogram pending 10/21 Potassium too high for spironolactone.  Aortic stenosis Noted on echocardiogram 10/2023.  Mean gradient 13.  Asymptomatic. Check echocardiogram 3 to 5 years   Polysubstance abuse Smoking 1 pack/week.  Drinking 2-3 beers occasionally.  Reports no methamphetamine use.  Continued to encourage cessation.  On bupropion .   HIV Followed by infectious disease.   Pulmonary nodule Right lower lobe pulmonary nodule up to 1 cm.  If the nodules are stable at time of repeat CT, then future CT at 18-24 months is considered optional for low-risk patients, but is recommended for high-risk patients. Will get repeat noncontrast CT 12/2024.    Hypertension Blood pressure overall well-controlled and managed in the context above.   Also on amlodipine  5 mg.   CVA Hyperlipidemia Follows with neurology.   He reports that he still taking aspirin  and Plavix .  Will route note to neurology if they still want him on the Plavix .   Continue with atorvastatin  80 mg.  LDL was not well-controlled, 180 05/2024.  Will see Pharm.D./lipid clinic 10/27.  Type 2 diabetes A1c 7.2% 04/2024.  I have removed multiple medications off his list which he said he was not taking.  Dispo: 7-month follow-up with Dr. Swaziland.  Signed, Thom LITTIE Sluder, PA-C
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
## 2024-07-20 NOTE — Therapy (Unsigned)
 OUTPATIENT OCCUPATIONAL THERAPY NEURO Treatment  Patient Name: Bruce Yang MRN: [id number] DOB:08-02-1971, 53 y.o., male Today's Date: 07/20/2024  PCP: [name], MD REFERRING PROVIDER: Lauraine Born, NP  END OF SESSION:  OT End of Session - 07/20/24 1402     Visit Number 2    Number of Visits 9    Date for Recertification  09/20/24    Authorization Type [employer]    Authorization Time Period 12 weeks    OT Start Time 1402    OT Stop Time 1440    OT Time Calculation (min) 38 min          Past Medical History:  Diagnosis Date   Diabetes mellitus without complication (HCC)    HIV disease (HCC)    Hyperlipidemia    Hypertension    Recurrent boils of anus    Stroke [facility])    History reviewed. No pertinent surgical history. Patient Active Problem List   Diagnosis Date Noted   Incidental pulmonary nodule, greater than or equal to 8mm 04/28/2024   Microalbuminuria 04/27/2024   Coronary artery disease 04/26/2024   HFrEF (heart failure with reduced ejection fraction) (HCC) 04/26/2024   Depression, major, single episode, mild 04/06/2024   Tendinitis of right rotator cuff 04/06/2024   Acute arthritis- Left knee 04/06/2024   Uncontrolled type 2 diabetes mellitus with hyperglycemia (HCC) 02/02/2024   Cellulitis of right lower extremity 02/02/2024   History of CVA (cerebrovascular accident) 11/19/2023   Essential hypertension 10/21/2023   Erectile dysfunction 10/21/2023   Aortic atherosclerosis 10/21/2023   Carotid artery disease 10/21/2023   G6PD deficiency 10/17/2023   HIV disease (HCC) 10/15/2023   Therapeutic drug monitoring 10/15/2023   Alcohol use 10/14/2023   Substance abuse (HCC) 10/14/2023   Cardiac murmur 10/14/2023   Methamphetamine use 10/14/2023   Acute CVA (cerebrovascular accident) (HCC) 10/13/2023   Spondylolisthesis, lumbosacral region 09/25/2023   Hyperlipidemia 08/20/2023   Hyperglycemia 08/19/2023   Chronic low back pain without sciatica  08/19/2023   Peripheral neuropathy 08/19/2023   Tobacco use disorder 08/19/2023   Marijuana use, continuous 08/19/2023    ONSET DATE: 06/09/24  REFERRING DIAG:  Diagnosis  I63.9 (ICD-10-CM) - Acute CVA (cerebrovascular accident) (HCC)    THERAPY DIAG:  Other lack of coordination  Other abnormalities of gait and mobility  Muscle weakness (generalized)  Attention and concentration deficit  Frontal lobe and executive function deficit  Rationale for Evaluation and Treatment: Rehabilitation  SUBJECTIVE:   SUBJECTIVE STATEMENT: Pt reports  he got vaccine yesterday Pt accompanied by: significant other  PERTINENT HISTORY: 53 y.o. male with medical history significant of polysubstance abuse, chronic low back pain presented to ED with complaint of numbness of his left face/arm/leg X 3 days.  In the ED, vital signs fairly stable.  Labs showed ethanol level 164, UDS positive for amphetamines and THC.  CT head showing new low density within the anterior right thalamus consistent with an infarction, exact age indeterminate. Brain MRI showing a 1 cm acute ischemic nonhemorrhagic right thalamic infarct and a 2 cm acute to early subacute ischemic nonhemorrhagic left basal ganglia infarct. MRI of C-spine negative for acute abnormality. Neurology recommended admission to [facility] for stroke workup. Patient passed his swallow study in the ED. [facility] called to admit.   First stroke was March 31st, second one was 10/13/23  PRECAUTIONS: Other: senssory impairment L hand, HIV  WEIGHT BEARING RESTRICTIONS: No  PAIN:  Are you having pain? No  FALLS: Has patient fallen in last  6 months? No  LIVING ENVIRONMENT: Lives with: lives with an adult companion Lives in: Other motel Stairs: Yes: External:    PLOF: Needs assistance with ADLs  PATIENT GOALS: improve use of left hand  OBJECTIVE:  Note: Objective measures were completed at Evaluation unless otherwise noted.  HAND DOMINANCE:  Left  ADLs: Overall ADLs: increased time due to LUE weakness Transfers/ambulation related to ADLs: Eating: needs assistance with cutting food Grooming: mod I UB Dressing: mod I LB Dressing: mod I Toileting: mod I Bathing: needs assist at times Tub Shower transfers: bathtub and shower supervision   IADLs: Shopping: needs assist Light housekeeping: dependent Meal Prep: dependent Community mobility: mod I to supervision Medication management: girlfriend assists Financial management: girlfriend assists Handwriting: 100% legible  MOBILITY STATUS: mod I     FUNCTIONAL OUTCOME MEASURES: Quick Dash: 80% disability  UPPER EXTREMITY ROM:  grossly WFLS    UPPER EXTREMITY MMT:     MMT Right eval Left eval  Shoulder flexion 3+/5 4-/5  Shoulder abduction    Shoulder adduction    Shoulder extension    Shoulder internal rotation    Shoulder external rotation    Middle trapezius    Lower trapezius    Elbow flexion 4/5 4-/5  Elbow extension    Wrist flexion    Wrist extension    Wrist ulnar deviation    Wrist radial deviation    Wrist pronation    Wrist supination    (Blank rows = not tested)  HAND FUNCTION: Grip strength: Right: 60 lbs; Left: 70 lbs  COORDINATION: 9 Hole Peg test: Right: 43.44 sec; Left: 29.37 sec  SENSATION: Light touch: Impaired  for LUE    COGNITION: Overall cognitive status: delayed processing 0/3 words following short delay, omitted r when spelling WORLD backwards, pt was abl to complete correctly on second trial       OBSERVATIONS: Pleasant male known to this therapist from previous OT accompanied by his GF.                                                                                                                             TREATMENT DATE: 06/28/24- eval         PATIENT EDUCATION: Education details: role of OT, potential goals Person educated: Patient and significant other Education method: Explanation Education  comprehension: verbalized understanding  HOME EXERCISE PROGRAM: n/a   GOALS: Goals reviewed with patient? Yes  SHORT TERM GOALS: Target date: 07/27/24  I with inital HEP Baseline:dependent Goal status: ongoing 07/20/24  2.  Pt will increase RUE grip strength by 5 lbs for increased functional use. Baseline: RUE 60 lbs, LUE 70 lbs Goal status: INITIAL  3.  Pt will perfrom all basic ADLs mod I consistently. Baseline: Pt requires assist for bathing thoroughness at times. Goal status: INITIAL  4.  Pt/ caregiver will verbalize understanding of cognitve compensations. Baseline: dependent Goal status: INITIAL 5. Pt will demonstrate ability to cut food  mod I using bilateral UE's  Baseline: needs assist  Goal status: inital  LONG TERM GOALS: Target date: 09/20/24  I with updated HEP Baseline: dependent Goal status: INITIAL  2.  Pt will improve Quick DASH score to 70% or better Baseline: 80% disability Goal status: INITIAL  3.  Pt will demonstrate improved RUE fine motor coordination as evidenced by decreaseing RUE 9-hole peg test score to 38 secs or less  Baseline: RUE 43.44, LUE 29.37 Goal status: INITIAL  4.  Pt will demonstrate ability to carry a plate in 1 hand and a cup in the other without drops/ spills. Baseline: Pt has splls when carrying itmes in both hands Goal status: INITIAL  5.  Pt will demonstrate ability to retrieve a 5 # object from overhead shelf with left and right UE's individually without drops. Baseline: decreased bilateral strength with functional reach Goal status: INITIAL   ASSESSMENT:  CLINICAL IMPRESSION: Patient is progressing towards goals with improving RUE functional use and strength. PERFORMANCE DEFICITS: in functional skills including ADLs, IADLs, coordination, dexterity, sensation, strength, flexibility, Fine motor control, Gross motor control, mobility, balance, decreased knowledge of precautions, decreased knowledge of use of DME, and  UE functional use, cognitive skills including attention, memory, orientation, problem solving, safety awareness, sequencing, temperament/personality, thought, and understand, and psychosocial skills including coping strategies, environmental adaptation, habits, interpersonal interactions, and routines and behaviors.   IMPAIRMENTS: are limiting patient from ADLs, IADLs, rest and sleep, education, work, play, leisure, and social participation.   CO-MORBIDITIES: may have co-morbidities  that affects occupational performance. Patient will benefit from skilled OT to address above impairments and improve overall function.  MODIFICATION OR ASSISTANCE TO COMPLETE EVALUATION: No modification of tasks or assist necessary to complete an evaluation.  OT OCCUPATIONAL PROFILE AND HISTORY: Detailed assessment: Review of records and additional review of physical, cognitive, psychosocial history related to current functional performance.  CLINICAL DECISION MAKING: LOW - limited treatment options, no task modification necessary  REHAB POTENTIAL: Good  EVALUATION COMPLEXITY: Low    PLAN:  OT FREQUENCY: 8 visits plus eval  OT DURATION: 12 weeks  PLANNED INTERVENTIONS: 97168 OT Re-evaluation, 97535 self care/ADL training, 02889 therapeutic exercise, 97530 therapeutic activity, 97112 neuromuscular re-education, 97035 ultrasound, 97018 paraffin, 02989 moist heat, 97129 Cognitive training (first 15 min), 02869 Cognitive training(each additional 15 min), passive range of motion, balance training, functional mobility training, energy conservation, coping strategies training, patient/family education, and DME and/or AE instructions  RECOMMENDED OTHER SERVICES: n/a  CONSULTED AND AGREED WITH PLAN OF CARE: Patient and family member/caregiver  PLAN FOR NEXT SESSION: inial HEP for RUE coordination and strength   Masami Plata, OT 07/20/2024, 2:03 PM

## 2024-07-21 ENCOUNTER — Telehealth: Payer: Self-pay

## 2024-07-21 ENCOUNTER — Telehealth: Payer: Self-pay | Admitting: Neurology

## 2024-07-21 ENCOUNTER — Other Ambulatory Visit (HOSPITAL_COMMUNITY): Payer: Self-pay

## 2024-07-21 LAB — HIV-1 RNA QUANT-NO REFLEX-BLD
HIV 1 RNA Quant: 37 {copies}/mL — ABNORMAL HIGH
HIV-1 RNA Quant, Log: 1.57 {Log_copies}/mL — ABNORMAL HIGH

## 2024-07-21 NOTE — Telephone Encounter (Signed)
 Will wait to see updated viral load result before speaking with patient.

## 2024-07-21 NOTE — Telephone Encounter (Signed)
 Pharmacy Patient Advocate Encounter- [place] BIV-Pharmacy Benefit:  PA was submitted to [facility] and has been approved through: 07/20/24-04/12-26 Authorization# [id number]  Please send prescription to Specialty Pharmacy: [facility]: [phone]  Estimated Copay is: 0.00

## 2024-07-21 NOTE — Telephone Encounter (Signed)
 I got a message from cardiology PA concerned patient still on DAPT aspirin  and Plavix .  I called the patient, he is only taking aspirin  81 mg daily.  This is correct from stroke standpoint.  He has already completed his 3 months of DAPT.

## 2024-07-22 ENCOUNTER — Other Ambulatory Visit (HOSPITAL_BASED_OUTPATIENT_CLINIC_OR_DEPARTMENT_OTHER): Payer: Self-pay

## 2024-07-23 ENCOUNTER — Telehealth: Payer: Self-pay | Admitting: Pharmacist

## 2024-07-23 ENCOUNTER — Encounter: Payer: Self-pay | Admitting: Internal Medicine

## 2024-07-23 NOTE — Telephone Encounter (Signed)
 Thanks

## 2024-07-23 NOTE — Telephone Encounter (Signed)
 Attempted to call patient today to review [place] counseling given updated undetectable viral load; unable to LVM on either phone number (home or mobile) as both VM boxes full. Will send MyChart requesting call.  Alan Geralds, PharmD, CPP, BCIDP, AAHIVP Clinical Pharmacist Practitioner Infectious Diseases Clinical Pharmacist [facility]

## 2024-07-23 NOTE — Telephone Encounter (Signed)
 They returned my call. He will start in December so that when he is due for his second dose / maintenance doses, he will be lined up with his partner's [employer] schedule. Thanks!

## 2024-07-26 ENCOUNTER — Encounter: Payer: Self-pay | Admitting: Family Medicine

## 2024-07-26 ENCOUNTER — Other Ambulatory Visit (HOSPITAL_BASED_OUTPATIENT_CLINIC_OR_DEPARTMENT_OTHER): Payer: Self-pay

## 2024-07-26 ENCOUNTER — Ambulatory Visit (INDEPENDENT_AMBULATORY_CARE_PROVIDER_SITE_OTHER): Admitting: Family Medicine

## 2024-07-26 VITALS — BP 136/84 | HR 84 | Temp 97.0°F | Ht 65.0 in | Wt 160.2 lb

## 2024-07-26 DIAGNOSIS — I10 Essential (primary) hypertension: Secondary | ICD-10-CM | POA: Diagnosis not present

## 2024-07-26 DIAGNOSIS — I5020 Unspecified systolic (congestive) heart failure: Secondary | ICD-10-CM | POA: Diagnosis not present

## 2024-07-26 DIAGNOSIS — E785 Hyperlipidemia, unspecified: Secondary | ICD-10-CM

## 2024-07-26 DIAGNOSIS — Z7984 Long term (current) use of oral hypoglycemic drugs: Secondary | ICD-10-CM

## 2024-07-26 DIAGNOSIS — I2510 Atherosclerotic heart disease of native coronary artery without angina pectoris: Secondary | ICD-10-CM

## 2024-07-26 DIAGNOSIS — J0100 Acute maxillary sinusitis, unspecified: Secondary | ICD-10-CM | POA: Diagnosis not present

## 2024-07-26 DIAGNOSIS — E1159 Type 2 diabetes mellitus with other circulatory complications: Secondary | ICD-10-CM | POA: Diagnosis not present

## 2024-07-26 DIAGNOSIS — Z794 Long term (current) use of insulin: Secondary | ICD-10-CM

## 2024-07-26 MED ORDER — METOPROLOL SUCCINATE ER 25 MG PO TB24
25.0000 mg | ORAL_TABLET | Freq: Every day | ORAL | 3 refills | Status: AC
  Filled 2024-07-26: qty 90, 90d supply, fill #0

## 2024-07-26 MED ORDER — AMOXICILLIN-POT CLAVULANATE 875-125 MG PO TABS
1.0000 | ORAL_TABLET | Freq: Two times a day (BID) | ORAL | 0 refills | Status: AC
  Filled 2024-07-26: qty 20, 10d supply, fill #0

## 2024-07-26 NOTE — Assessment & Plan Note (Addendum)
 Blood pressure is elevated today. Continue sacubitril -valsartan  (Entresto ) 49-51 mg bid and amlodipine  5 mg daily. I will add metoprolol succinate 25 mg daily.

## 2024-07-26 NOTE — Assessment & Plan Note (Signed)
 Continue insulin  glargine 16 units daily and empagliflozin  10 mg daily. I will check his A1c today.

## 2024-07-26 NOTE — Assessment & Plan Note (Signed)
 Continue atorvastatin 80 mg daily

## 2024-07-26 NOTE — Assessment & Plan Note (Addendum)
 Partially compensated. Continue sacubitril -valsartan  (Entresto ) 49-51 mg bid and empagliflozin  10 mg daily. As his blood pressure remains high, I will add metoprolol succinate 25 mg daily to his regimen. I would consider addition of spironolactone in the future and titrating other meds to meet GDMT.

## 2024-07-26 NOTE — Assessment & Plan Note (Signed)
 Stable. Continue aspirin  81 mg daily. Add metoprolol succinate 25 mg daily.

## 2024-07-26 NOTE — Progress Notes (Signed)
 [facility] [address] [place] [address] Dept: [phone] Dept Fax: [phone]  Chronic Care Office Visit  Subjective:    Patient ID: Bruce Yang, male    DOB: [birth year], 53 y.o..   MRN: [id number]  Chief Complaint  Patient presents with   Hypertension    3 month f/u HTN.  135/?  C.o having sinus issues since getting teeth pulled x 1 week.   No OTC meds taken.    History of Present Illness:  Patient is in today for reassessment of chronic medical issues.  Mr. Nydam suffered an acute right CVA on Jan 16 with numbness to his left face, arm, and leg.  He has had improvement in the use of his left arm and leg, though he notes some ongoing numbness in the left hand. He is managed aspirin  81 mg daily. He notes some persistent numbness of the left forearm/hand since the stroke.   Mr. Frett has hypertension. He had been managed on amlodipine  5 mg daily and sacubitril -valsartan  24-26 mg bid.    Mr. Candelas has hyperlipidemia. He is managed on atorvastatin  80 mg daily.   Mr. Dyas had new-onset diabetes in April. He is currently managed on insulin  glargine 16 units daily and  empagliflozin  10 mg daily.   Mr. Eissler has had issues with depression since his stroke. He is managed on bupropion  XL 150 mg daily.    Mr. Hanzlik tested positive for HIV in Jan. He is currently managed on dolutegravir -lamivudine  50-300 mg daily.  Mr. Belling has HFmrEF. He is currently managed on sacubitril -valsartan  49-51 mg twice daily and empagliflozin  10 mg daily. He admits to some orthopnea and is using several pillows. He also does get some swelling in his lower legs.  Mr. Damiano had his upper teeth pulled last week. There were some roots that could not be extracted due to proximity ot his sinuses. He has noted some pain in his left maxillary sinus over the past week.  Past Medical History: Patient Active Problem List    Diagnosis Date Noted   Incidental pulmonary nodule, greater than or equal to 8mm 04/28/2024   Microalbuminuria 04/27/2024   Coronary artery disease 04/26/2024   HFrEF (heart failure with reduced ejection fraction) (HCC) 04/26/2024   Depression, major, single episode, mild 04/06/2024   Tendinitis of right rotator cuff 04/06/2024   Acute arthritis- Left knee 04/06/2024   Type 2 diabetes mellitus with cardiac complication (HCC) 02/02/2024   Cellulitis of right lower extremity 02/02/2024   History of CVA (cerebrovascular accident) 11/19/2023   Essential hypertension 10/21/2023   Erectile dysfunction 10/21/2023   Aortic atherosclerosis 10/21/2023   Carotid artery disease 10/21/2023   G6PD deficiency 10/17/2023   HIV disease (HCC) 10/15/2023   Therapeutic drug monitoring 10/15/2023   Alcohol use 10/14/2023   Substance abuse (HCC) 10/14/2023   Cardiac murmur 10/14/2023   Methamphetamine use 10/14/2023   Spondylolisthesis, lumbosacral region 09/25/2023   Hyperlipidemia 08/20/2023   Chronic low back pain without sciatica 08/19/2023   Peripheral neuropathy 08/19/2023   Tobacco use disorder 08/19/2023   Marijuana use, continuous 08/19/2023   No past surgical history on file. Family History  Problem Relation Age of Onset   Other Mother        Homicide   Hypertension Maternal Aunt    Diabetes Maternal Aunt    Cancer Maternal Uncle        Multiple myeloma   Liver disease Neg Hx    Esophageal cancer  Neg Hx    Colon cancer Neg Hx    Outpatient Medications Prior to Visit  Medication Sig Dispense Refill   acetaminophen  (TYLENOL ) 500 MG tablet Take 1-2 tablets (500-1,000 mg total) by mouth every 6 (six) hours as needed for mild pain (pain score 1-3) or moderate pain (pain score 4-6).     amLODipine  (NORVASC ) 5 MG tablet Take 1 tablet (5 mg total) by mouth daily. 90 tablet 3   aspirin  EC 81 MG tablet Take 1 tablet (81 mg total) by mouth daily. Swallow whole. 90 tablet 3   atorvastatin   (LIPITOR ) 80 MG tablet Take 1 tablet (80 mg total) by mouth daily. 90 tablet 3   buPROPion  (WELLBUTRIN  SR) 150 MG 12 hr tablet Take 1 tablet daily for 3 days then take 1 tablet twice daily thereafter 60 tablet 2   buPROPion  (WELLBUTRIN  XL) 150 MG 24 hr tablet Take 1 tablet (150 mg total) by mouth daily. 30 tablet 3   Continuous Glucose Receiver (DEXCOM G6 RECEIVER) DEVI Use as directed to monitor glucose continuously. 1 each 0   Continuous Glucose Sensor (DEXCOM G6 SENSOR) MISC Use as directed and replace every 10 days. 3 each 5   Continuous Glucose Sensor (DEXCOM G6 SENSOR) MISC Use as directed to monitor glucose continuously. 3 each 5   Continuous Glucose Transmitter (DEXCOM G6 TRANSMITTER) MISC 1 Units by Does not apply route every 14 (fourteen) days. 2 each 3   Continuous Glucose Transmitter (DEXCOM G6 TRANSMITTER) MISC Use as directed and replace every 90 days. 2 each 6   dolutegravir -lamiVUDine  (DOVATO ) 50-300 MG tablet Take 1 tablet by mouth daily. 30 tablet 11   empagliflozin  (JARDIANCE ) 10 MG TABS tablet Take 1 tablet (10 mg total) by mouth daily before breakfast. 30 tablet 11   empagliflozin  (JARDIANCE ) 10 MG TABS tablet Take 1 tablet (10 mg total) by mouth daily before breakfast. 28 tablet 0   furosemide  (LASIX ) 20 MG tablet Take 1 tablet (20 mg total) by mouth as needed for fluid or edema. 90 tablet 3   insulin  glargine (LANTUS  SOLOSTAR) 100 UNIT/ML Solostar Pen Inject 16 Units into the skin daily. 15 mL 3   insulin  glargine-yfgn (SEMGLEE ) 100 UNIT/ML Pen Inject 12 Units into the skin daily. (Patient taking differently: Inject 16 Units into the skin daily.) 15 mL 3   Insulin  Pen Needle (INSUPEN PEN NEEDLES) 32G X 4 MM MISC Use once daily 100 each 0   meloxicam  (MOBIC ) 15 MG tablet TAKE 1 TABLET (15 MG TOTAL) BY MOUTH DAILY. 30 tablet 0   sacubitril -valsartan  (ENTRESTO ) 49-51 MG Take 1 tablet by mouth 2 (two) times daily. 60 tablet 11   sildenafil (VIAGRA) 50 MG tablet Take 1 tablet (50  mg total) by mouth daily as needed for erectile dysfunction. 10 tablet 5   folic acid  (FOLVITE ) 1 MG tablet Take 1 tablet (1 mg total) by mouth daily. 30 tablet 1   Multiple Vitamin (MULTIVITAMIN WITH MINERALS) TABS tablet Take 1 tablet by mouth daily.     sitaGLIPtin  (JANUVIA ) 50 MG tablet Take 1 tablet (50 mg total) by mouth daily. (Patient not taking: Reported on 07/26/2024) 90 tablet 3   thiamine  (VITAMIN B1) 100 MG tablet Take 1 tablet (100 mg total) by mouth daily. (Patient not taking: Reported on 07/26/2024) 30 tablet 1   clopidogrel  (PLAVIX ) 75 MG tablet Take 1 tablet (75 mg total) by mouth daily. 30 tablet 1   No facility-administered medications prior to visit.   Allergies  Allergen  Reactions   Antihistamines, Chlorpheniramine-Type     Affects Prostate    Objective:   Today's Vitals   07/26/24 1546  BP: 136/84  Pulse: 84  Temp: (!) 97 F (36.1 C)  TempSrc: Temporal  SpO2: 98%  Weight: 160 lb 3.2 oz (72.7 kg)  Height: 5' 5 (1.651 m)   Body mass index is 26.66 kg/m.   General: Well developed, well nourished. No acute distress. HEENT: Normocephalic, non-traumatic. Mild pain on percussion over the left maxillary sinus. Mucous membranes moist.   Oropharynx clear. Upper dental ridge is healing well. CV: RRR without murmurs or rubs. Pulses 2+ bilaterally. Extremities: 1 + edema of LE. Psych: Alert and oriented. Normal mood and affect.  Health Maintenance Due  Topic Date Due   OPHTHALMOLOGY EXAM  Never done   Hepatitis B Vaccines 19-59 Average Risk (1 of 3 - 19+ 3-dose series) Never done   Colonoscopy  Never done     Assessment & Plan:   Problem List Items Addressed This Visit       Cardiovascular and Mediastinum   Coronary artery disease   Stable. Continue aspirin  81 mg daily. Add metoprolol succinate 25 mg daily.      Relevant Medications   metoprolol succinate (TOPROL-XL) 25 MG 24 hr tablet   Essential hypertension   Blood pressure is elevated today.  Continue sacubitril -valsartan  (Entresto ) 49-51 mg bid and amlodipine  5 mg daily. I will add metoprolol succinate 25 mg daily.      Relevant Medications   metoprolol succinate (TOPROL-XL) 25 MG 24 hr tablet   HFrEF (heart failure with reduced ejection fraction) (HCC)   Partially compensated. Continue sacubitril -valsartan  (Entresto ) 49-51 mg bid and empagliflozin  10 mg daily. As his blood pressure remains high, I will add metoprolol succinate 25 mg daily to his regimen. I would consider addition of spironolactone in the future and titrating other meds to meet GDMT.       Relevant Medications   metoprolol succinate (TOPROL-XL) 25 MG 24 hr tablet   Type 2 diabetes mellitus with cardiac complication (HCC) - Primary   Continue insulin  glargine 16 units daily and empagliflozin  10 mg daily. I will check his A1c today.      Relevant Medications   metoprolol succinate (TOPROL-XL) 25 MG 24 hr tablet   Other Relevant Orders   Glucose, random   Hemoglobin A1c     Other   Hyperlipidemia   Continue atorvastatin  80 mg daily.      Relevant Medications   metoprolol succinate (TOPROL-XL) 25 MG 24 hr tablet   Other Visit Diagnoses       Acute non-recurrent maxillary sinusitis       Symptoms and signs of a sinus infection s/p upper teeth extractions. I will treat him with a course of Augmentin.   Relevant Medications   amoxicillin -clavulanate (AUGMENTIN) 875-125 MG tablet       Return in about 3 months (around 10/26/2024) for Reassessment.   Garnette CHRISTELLA Simpler, MD

## 2024-07-27 ENCOUNTER — Ambulatory Visit: Payer: Self-pay | Admitting: Family Medicine

## 2024-07-27 ENCOUNTER — Other Ambulatory Visit: Payer: Self-pay

## 2024-07-27 ENCOUNTER — Ambulatory Visit (HOSPITAL_COMMUNITY)
Admission: RE | Admit: 2024-07-27 | Discharge: 2024-07-27 | Disposition: A | Discharge status: Home / Self Care | Source: Ambulatory Visit | Attending: Cardiology | Admitting: Cardiology

## 2024-07-27 DIAGNOSIS — I5020 Unspecified systolic (congestive) heart failure: Secondary | ICD-10-CM | POA: Insufficient documentation

## 2024-07-27 LAB — ECHOCARDIOGRAM COMPLETE
AR max vel: 1.3 cm2
AV Area VTI: 1.39 cm2
AV Area mean vel: 1.33 cm2
AV Mean grad: 14.5 mmHg
AV Peak grad: 29.4 mmHg
Ao pk vel: 2.71 m/s
Area-P 1/2: 3.79 cm2
P 1/2 time: 396 ms
S' Lateral: 3 cm

## 2024-07-27 LAB — HEMOGLOBIN A1C: Hgb A1c MFr Bld: 6.6 % — ABNORMAL HIGH (ref 4.6–6.5)

## 2024-07-27 LAB — GLUCOSE, RANDOM: Glucose, Bld: 96 mg/dL (ref 70–99)

## 2024-07-29 ENCOUNTER — Ambulatory Visit

## 2024-07-29 ENCOUNTER — Other Ambulatory Visit: Payer: Self-pay

## 2024-07-29 ENCOUNTER — Encounter: Payer: Self-pay | Admitting: Occupational Therapy

## 2024-07-29 ENCOUNTER — Ambulatory Visit: Admitting: Occupational Therapy

## 2024-07-29 DIAGNOSIS — R278 Other lack of coordination: Secondary | ICD-10-CM

## 2024-07-29 DIAGNOSIS — R41844 Frontal lobe and executive function deficit: Secondary | ICD-10-CM

## 2024-07-29 DIAGNOSIS — M79601 Pain in right arm: Secondary | ICD-10-CM

## 2024-07-29 DIAGNOSIS — R41840 Attention and concentration deficit: Secondary | ICD-10-CM

## 2024-07-29 DIAGNOSIS — R2689 Other abnormalities of gait and mobility: Secondary | ICD-10-CM

## 2024-07-29 DIAGNOSIS — M6281 Muscle weakness (generalized): Secondary | ICD-10-CM

## 2024-07-29 DIAGNOSIS — M79602 Pain in left arm: Secondary | ICD-10-CM

## 2024-07-29 DIAGNOSIS — I6389 Other cerebral infarction: Secondary | ICD-10-CM

## 2024-07-29 NOTE — Progress Notes (Signed)
Patient has been notified directly; all questions, if any, were answered. Patient voiced understanding.   

## 2024-07-29 NOTE — Patient Instructions (Signed)
  Coordination Activities  Perform the following activities for 10 minutes 1 times per day with right hand(s).  Rotate ball in fingertips (clockwise and counter-clockwise). Toss ball between hands. Toss ball in air and catch with the same hand. Flip cards 1 at a time  Deal cards with your thumb (Hold deck in hand and push card off top with thumb). Pick up coins and place in container or coin bank. Pick up coins and stack. Pick up coins one at a time until you get 5-10 in your hand, then move coins from palm to fingertips to place in continer one at a time.
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
## 2024-08-02 ENCOUNTER — Encounter: Payer: Self-pay | Admitting: Pharmacist Clinician (PhC)/ Clinical Pharmacy Specialist

## 2024-08-02 ENCOUNTER — Other Ambulatory Visit (HOSPITAL_COMMUNITY): Payer: Self-pay

## 2024-08-02 ENCOUNTER — Ambulatory Visit: Attending: Cardiology | Admitting: Pharmacist Clinician (PhC)/ Clinical Pharmacy Specialist

## 2024-08-02 ENCOUNTER — Telehealth: Payer: Self-pay | Admitting: Pharmacist Clinician (PhC)/ Clinical Pharmacy Specialist

## 2024-08-02 ENCOUNTER — Telehealth: Payer: Self-pay | Admitting: Pharmacy Technician

## 2024-08-02 DIAGNOSIS — E7800 Pure hypercholesterolemia, unspecified: Secondary | ICD-10-CM | POA: Diagnosis not present
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
## 2024-08-02 NOTE — Telephone Encounter (Addendum)
   Pharmacy Patient Advocate Encounter   Received notification from Pt Calls Messages that prior authorization for repatha is required/requested.   Insurance verification completed.   The patient is insured through [facility].   Per test claim: PA required; PA submitted to above mentioned insurance via Latent Key/confirmation #/EOC [id number] Status is pending

## 2024-08-02 NOTE — Telephone Encounter (Signed)
 Please do PA for Repatha, has familial hyperlipidemia

## 2024-08-02 NOTE — Patient Instructions (Signed)
 Your Results:             Your most recent labs Goal  Total Cholesterol 263 < 200  Triglycerides 83 < 150  HDL (happy/good cholesterol) 69 > 40  LDL (lousy/bad cholesterol 180 < 55   Medication changes:  We will start the process to get Repatha covered by your insurance.  Once the prior authorization is complete, I will call/send a MyChart message to let you know and confirm pharmacy information.   You will take one injection every 14 days  Lab orders:  We want to repeat labs after 3 months.  We will send you a lab order to remind you once we get closer to that time.     Thank you for choosing [facility]
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
## 2024-08-02 NOTE — Telephone Encounter (Signed)
 Pharmacy Patient Advocate Encounter  Received notification from [employer] that Prior Authorization for repatha has been APPROVED from 08/02/24 to 10/31/24. Ran test claim, Copay is $24.99. This test claim was processed through [facility]- copay amounts may vary at other pharmacies due to pharmacy/plan contracts, or as the patient moves through the different stages of their insurance plan.

## 2024-08-03 ENCOUNTER — Other Ambulatory Visit (HOSPITAL_BASED_OUTPATIENT_CLINIC_OR_DEPARTMENT_OTHER): Payer: Self-pay

## 2024-08-03 ENCOUNTER — Encounter (INDEPENDENT_AMBULATORY_CARE_PROVIDER_SITE_OTHER): Payer: Self-pay

## 2024-08-03 ENCOUNTER — Other Ambulatory Visit: Payer: Self-pay

## 2024-08-03 ENCOUNTER — Other Ambulatory Visit (HOSPITAL_COMMUNITY): Payer: Self-pay

## 2024-08-03 ENCOUNTER — Ambulatory Visit

## 2024-08-03 ENCOUNTER — Ambulatory Visit: Admitting: Occupational Therapy

## 2024-08-03 NOTE — Progress Notes (Signed)
 Specialty Pharmacy Refill Coordination Note  SHERRIE Telfair is a 53 y.o. male contacted today regarding refills of specialty medication(s) Dolutegravir -lamiVUDine  (Dovato )   Patient requested Pickup at [facility] at [place] date: 08/06/24   Medication will be filled on: 08/05/24

## 2024-08-03 NOTE — Progress Notes (Signed)
 Specialty Pharmacy Ongoing Clinical Assessment Note  I spoke with patient's fiance, Bruce Yang. Bruce Yang is a 53 y.o. male who is being followed by the specialty pharmacy service for RxSp HIV   Patient's specialty medication(s) reviewed today: Dolutegravir -lamiVUDine  (Dovato )   Missed doses in the last 4 weeks: 0   Patient/Caregiver did not have any additional questions or concerns.   Therapeutic benefit summary: Patient is achieving benefit   Adverse events/side effects summary: No adverse events/side effects   Patient's therapy is appropriate to: Continue    Goals Addressed             This Visit's Progress    Achieve Undetectable HIV Viral Load < 20   Improving    Patient is not on track and improving. Patient will work on increased adherence. Patient's viral load as of 07/19/24 was 37 copies/mL         Follow up: 3 months  Bruce Yang Specialty Pharmacist

## 2024-08-04 ENCOUNTER — Other Ambulatory Visit (HOSPITAL_BASED_OUTPATIENT_CLINIC_OR_DEPARTMENT_OTHER): Payer: Self-pay

## 2024-08-04 ENCOUNTER — Other Ambulatory Visit: Payer: Self-pay

## 2024-08-06 ENCOUNTER — Telehealth: Payer: Self-pay

## 2024-08-06 ENCOUNTER — Other Ambulatory Visit (HOSPITAL_COMMUNITY): Payer: Self-pay

## 2024-08-06 ENCOUNTER — Other Ambulatory Visit (HOSPITAL_BASED_OUTPATIENT_CLINIC_OR_DEPARTMENT_OTHER): Payer: Self-pay

## 2024-08-06 NOTE — Telephone Encounter (Signed)
 Pharmacy Patient Advocate Encounter   Received notification from Onbase that prior authorization for Dexcom G6 Sensor  is required/requested.   Insurance verification completed.   The patient is insured through [employer].   Per test claim: PA required; PA submitted to above mentioned insurance via Latent Key/confirmation #/EOC [id number] Status is pending

## 2024-08-06 NOTE — Telephone Encounter (Signed)
 Pharmacy Patient Advocate Encounter  Received notification from [employer] that Prior Authorization for Dexcom G6 Sensor  has been APPROVED from 08/06/24 to 07/09/25. Ran test claim, Copay is $0. This test claim was processed through [facility]- copay amounts may vary at other pharmacies due to pharmacy/plan contracts, or as the patient moves through the different stages of their insurance plan.   PA #/Case ID/Reference #: [id number]

## 2024-08-08 ENCOUNTER — Other Ambulatory Visit (HOSPITAL_BASED_OUTPATIENT_CLINIC_OR_DEPARTMENT_OTHER): Payer: Self-pay

## 2024-08-08 ENCOUNTER — Encounter: Payer: Self-pay | Admitting: Pharmacist Clinician (PhC)/ Clinical Pharmacy Specialist

## 2024-08-08 DIAGNOSIS — E7800 Pure hypercholesterolemia, unspecified: Secondary | ICD-10-CM

## 2024-08-08 MED ORDER — REPATHA SURECLICK 140 MG/ML ~~LOC~~ SOAJ
140.0000 mg | SUBCUTANEOUS | 3 refills | Status: AC
  Filled 2024-08-08 – 2024-08-11 (×2): qty 2, 28d supply, fill #0

## 2024-08-08 NOTE — Addendum Note (Signed)
 Addended by: Raymon Schlarb L on: 08/08/2024 11:58 AM   Modules accepted: Orders

## 2024-08-09 ENCOUNTER — Other Ambulatory Visit (HOSPITAL_BASED_OUTPATIENT_CLINIC_OR_DEPARTMENT_OTHER): Payer: Self-pay

## 2024-08-09 ENCOUNTER — Encounter: Payer: Self-pay | Admitting: Radiology

## 2024-08-09 ENCOUNTER — Other Ambulatory Visit (HOSPITAL_COMMUNITY): Payer: Self-pay
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
## 2024-08-10 ENCOUNTER — Ambulatory Visit: Attending: Neurology

## 2024-08-10 ENCOUNTER — Ambulatory Visit: Admitting: Occupational Therapy

## 2024-08-10 ENCOUNTER — Other Ambulatory Visit (HOSPITAL_BASED_OUTPATIENT_CLINIC_OR_DEPARTMENT_OTHER): Payer: Self-pay

## 2024-08-10 DIAGNOSIS — I6389 Other cerebral infarction: Secondary | ICD-10-CM | POA: Insufficient documentation

## 2024-08-10 DIAGNOSIS — M6281 Muscle weakness (generalized): Secondary | ICD-10-CM | POA: Insufficient documentation

## 2024-08-10 DIAGNOSIS — M79602 Pain in left arm: Secondary | ICD-10-CM | POA: Diagnosis present

## 2024-08-10 DIAGNOSIS — R2689 Other abnormalities of gait and mobility: Secondary | ICD-10-CM | POA: Diagnosis present

## 2024-08-10 DIAGNOSIS — M79601 Pain in right arm: Secondary | ICD-10-CM | POA: Insufficient documentation

## 2024-08-10 DIAGNOSIS — R278 Other lack of coordination: Secondary | ICD-10-CM

## 2024-08-10 DIAGNOSIS — R260 Ataxic gait: Secondary | ICD-10-CM | POA: Diagnosis present

## 2024-08-10 DIAGNOSIS — R41840 Attention and concentration deficit: Secondary | ICD-10-CM | POA: Insufficient documentation

## 2024-08-10 DIAGNOSIS — R41844 Frontal lobe and executive function deficit: Secondary | ICD-10-CM
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
## 2024-08-11 ENCOUNTER — Other Ambulatory Visit (HOSPITAL_BASED_OUTPATIENT_CLINIC_OR_DEPARTMENT_OTHER): Payer: Self-pay

## 2024-08-11 ENCOUNTER — Telehealth: Payer: Self-pay | Admitting: Pharmacy Technician

## 2024-08-11 ENCOUNTER — Other Ambulatory Visit (HOSPITAL_COMMUNITY): Payer: Self-pay

## 2024-08-11 NOTE — Telephone Encounter (Signed)
   Trying pa on secondary. Pa still good on primary    Pharmacy Patient Advocate Encounter   Received notification from CoverMyMeds that prior authorization for REPATHA is required/requested.   Insurance verification completed.   The patient is insured through [facility] MEDICAID.   Per test claim: PA required; PA submitted to above mentioned insurance via Latent Key/confirmation #/EOC [facility] Status is pending

## 2024-08-11 NOTE — Telephone Encounter (Signed)
 Hi, so the patient now has 2 insurances. His primary insurance still goes through but copay is 515.34 for 30 days. The second insurance is denying the prior authorization saying we have to submit labs showing inadequate control on statins and ezetimibe.     Pharmacy Patient Advocate Encounter  Received notification from [facility] MEDICAID that Prior Authorization for repatha has been DENIED.  See denial reason below. No denial letter attached in CMM. Will attach denial letter to Media tab once received.   PA #/Case ID/Reference #: [id number]

## 2024-08-14 ENCOUNTER — Other Ambulatory Visit (HOSPITAL_BASED_OUTPATIENT_CLINIC_OR_DEPARTMENT_OTHER): Payer: Self-pay

## 2024-08-15 ENCOUNTER — Other Ambulatory Visit (HOSPITAL_BASED_OUTPATIENT_CLINIC_OR_DEPARTMENT_OTHER): Payer: Self-pay

## 2024-08-15 MED ORDER — EZETIMIBE 10 MG PO TABS
10.0000 mg | ORAL_TABLET | Freq: Every day | ORAL | 3 refills | Status: AC
  Filled 2024-08-15: qty 90, 90d supply, fill #0

## 2024-08-15 NOTE — Addendum Note (Signed)
 Addended by: Nino Amano L on: 08/15/2024 11:30 AM   Modules accepted: Orders

## 2024-08-16 ENCOUNTER — Other Ambulatory Visit: Payer: Self-pay

## 2024-08-16 ENCOUNTER — Other Ambulatory Visit (HOSPITAL_BASED_OUTPATIENT_CLINIC_OR_DEPARTMENT_OTHER): Payer: Self-pay

## 2024-08-17 ENCOUNTER — Ambulatory Visit: Admitting: Occupational Therapy

## 2024-08-17 NOTE — Therapy (Incomplete)
 OUTPATIENT OCCUPATIONAL THERAPY NEURO Treatment  Patient Name: Bruce Yang MRN: [id number] DOB:May 29, 1971, 53 y.o., male Today's Date: 08/17/2024  PCP: [name], MD REFERRING PROVIDER: Lauraine Born, NP  END OF SESSION:      Past Medical History:  Diagnosis Date   Diabetes mellitus without complication (HCC)    HIV disease (HCC)    Hyperlipidemia    Hypertension    Recurrent boils of anus    Stroke [facility])    No past surgical history on file. Patient Active Problem List   Diagnosis Date Noted   Incidental pulmonary nodule, greater than or equal to 8mm 04/28/2024   Microalbuminuria 04/27/2024   Coronary artery disease 04/26/2024   HFrEF (heart failure with reduced ejection fraction) (HCC) 04/26/2024   Depression, major, single episode, mild 04/06/2024   Tendinitis of right rotator cuff 04/06/2024   Acute arthritis- Left knee 04/06/2024   Type 2 diabetes mellitus with cardiac complication (HCC) 02/02/2024   Cellulitis of right lower extremity 02/02/2024   History of CVA (cerebrovascular accident) 11/19/2023   Essential hypertension 10/21/2023   Erectile dysfunction 10/21/2023   Aortic atherosclerosis 10/21/2023   Carotid artery disease 10/21/2023   G6PD deficiency 10/17/2023   HIV disease (HCC) 10/15/2023   Therapeutic drug monitoring 10/15/2023   Alcohol use 10/14/2023   Substance abuse (HCC) 10/14/2023   Cardiac murmur 10/14/2023   Methamphetamine use 10/14/2023   Spondylolisthesis, lumbosacral region 09/25/2023   Hyperlipidemia 08/20/2023   Chronic low back pain without sciatica 08/19/2023   Peripheral neuropathy 08/19/2023   Tobacco use disorder 08/19/2023   Marijuana use, continuous 08/19/2023    ONSET DATE: 06/09/24  REFERRING DIAG:  Diagnosis  I63.9 (ICD-10-CM) - Acute CVA (cerebrovascular accident) (HCC)    THERAPY DIAG:  No diagnosis found.  Rationale for Evaluation and Treatment: Rehabilitation  SUBJECTIVE:   SUBJECTIVE  STATEMENT: Pt reports  right shoulder pain Pt accompanied by: significant other  PERTINENT HISTORY: 53 y.o. male with medical history significant of polysubstance abuse, chronic low back pain presented to ED with complaint of numbness of his left face/arm/leg X 3 days.  In the ED, vital signs fairly stable.  Labs showed ethanol level 164, UDS positive for amphetamines and THC.  CT head showing new low density within the anterior right thalamus consistent with an infarction, exact age indeterminate. Brain MRI showing a 1 cm acute ischemic nonhemorrhagic right thalamic infarct and a 2 cm acute to early subacute ischemic nonhemorrhagic left basal ganglia infarct. MRI of C-spine negative for acute abnormality. Neurology recommended admission to [facility] for stroke workup. Patient passed his swallow study in the ED. [facility] called to admit.   First stroke was March 31st, second one was 10/13/23  PRECAUTIONS: Other: senssory impairment L hand, HIV  WEIGHT BEARING RESTRICTIONS: No  PAIN: PAIN:  Are you having pain? Yes: NPRS scale: 5/10 Pain location: right shoulder and upper arm Pain description: aching Aggravating factors: malpositioning Relieving factors: heat     FALLS: Has patient fallen in last 6 months? No  LIVING ENVIRONMENT: Lives with: lives with an adult companion Lives in: Other motel Stairs: Yes: External:    PLOF: Needs assistance with ADLs  PATIENT GOALS: improve use of left hand  OBJECTIVE:  Note: Objective measures were completed at Evaluation unless otherwise noted.  HAND DOMINANCE: Left  ADLs: Overall ADLs: increased time due to LUE weakness Transfers/ambulation related to ADLs: Eating: needs assistance with cutting food Grooming: mod I UB Dressing: mod I LB Dressing: mod I Toileting:  mod I Bathing: needs assist at times Tub Shower transfers: bathtub and shower supervision   IADLs: Shopping: needs assist Light housekeeping: dependent Meal Prep:  dependent Community mobility: mod I to supervision Medication management: girlfriend assists Financial management: girlfriend assists Handwriting: 100% legible  MOBILITY STATUS: mod I     FUNCTIONAL OUTCOME MEASURES: Quick Dash: 80% disability  UPPER EXTREMITY ROM:  grossly WFLS    UPPER EXTREMITY MMT:     MMT Right eval Left eval  Shoulder flexion 3+/5 4-/5  Shoulder abduction    Shoulder adduction    Shoulder extension    Shoulder internal rotation    Shoulder external rotation    Middle trapezius    Lower trapezius    Elbow flexion 4/5 4-/5  Elbow extension    Wrist flexion    Wrist extension    Wrist ulnar deviation    Wrist radial deviation    Wrist pronation    Wrist supination    (Blank rows = not tested)  HAND FUNCTION: Grip strength: Right: 60 lbs; Left: 70 lbs  COORDINATION: 9 Hole Peg test: Right: 43.44 sec; Left: 29.37 sec  SENSATION: Light touch: Impaired  for LUE    COGNITION: Overall cognitive status: delayed processing 0/3 words following short delay, omitted r when spelling WORLD backwards, pt was abl to complete correctly on second trial       OBSERVATIONS: Pleasant male known to this therapist from previous OT accompanied by his GF.                                                                                                                             TREATMENT DATE: 08/10/24- Pt rpeorts R shoulder pain today, it appears to be related to malpositioning and internal rotation and scapular elevation on right side. Supine closed chain shoulder flexion and chest  pres, min v.c. Pt was pain free Seated low range shoulder flexion with R shoulder pain, v.c for proper positioning.  Hotpack applied to right shoulder x 10 mins while performing activities at the tabletop.no adrese reactions. Reduced pain following use.  Gripper set at level 2 to pick up 1 inch blocks, min v.c. Placing grooved pegs in to pegboard with RUE then removing  for increased fine motor coordination, min difficulty/v.c  Therapsit educated pt/ GF regarding positioning to avoid to minimize shoulder pain. Therapsit recommends pt avoids resisted biceps curls due to pain. Pt was encouraged to perfrom fine motor coordination tasks at home to improve coordiantion.    07/29/24-Reveiwed red theraband exercises 10-15 reps each min v.c Pt was instructed in coordination HEP, he returned demonstration.  Gripper set at level 4 to pick up blocks with RUE, min difficulty/ v.c 1 rest break required.  UBE x 5 mins level 3 for conditioning  07/20/24- Pt was educated regarding red theraband HEP, see pt instructions, min v.c Pt was issued green putty for sustained R grip and tip pinch see pt instructions. Placing and removing  metal pegs from pegboard with RUE for increased fine motor coordination, min difficulty/ v.c   06/28/24- eval         PATIENT EDUCATION: Education details: postions to avoid to minimize pain in R shoulder, discontinue biceps curls Person educated: Patient, GF Education method: Explanation, demonstration, handouts Education comprehension: verbalized understanding, returned demonstration,   HOME EXERCISE PROGRAM: theraband, coordination supine cane   GOALS: Goals reviewed with patient? Yes  SHORT TERM GOALS: Target date: 07/27/24  I with inital HEP Baseline:dependent Goal status: ongoing, issued  07/20/24  2.  Pt will increase RUE grip strength by 5 lbs for increased functional use. Baseline: RUE 60 lbs, LUE 70 lbs Goal status: met 70 lbs 07/29/24  3.  Pt will perfrom all basic ADLs mod I consistently. Baseline: Pt requires assist for bathing thoroughness at times. Goal status: met, 07/29/24  4.  Pt/ caregiver will verbalize understanding of cognitve compensations. Baseline: dependent Goal status: ongoing 07/29/24 5. Pt will demonstrate ability to cut food mod I using bilateral UE's  Baseline: needs assist  Goal status:  met, 08/10/24  LONG TERM GOALS: Target date: 09/20/24  I with updated HEP Baseline: dependent Goal status: INITIAL  2.  Pt will improve Quick DASH score to 70% or better Baseline: 80% disability Goal status: ongoing 08/10/24  3.  Pt will demonstrate improved RUE fine motor coordination as evidenced by decreaseing RUE 9-hole peg test score to 38 secs or less  Baseline: RUE 43.44, LUE 29.37 Goal status:38.56 sec ongoing, 08/10/24  4.  Pt will demonstrate ability to carry a plate in 1 hand and a cup in the other without drops/ spills. Baseline: Pt has splls when carrying itmes in both hands Goal status:   5.  Pt will demonstrate ability to retrieve a 5 # object from overhead shelf with left and right UE's individually without drops. Baseline: decreased bilateral strength with functional reach Goal status: ongoing 11/04   ASSESSMENT:  CLINICAL IMPRESSION: Patient is progressing towards goals. Today pt was limited by right shoulder pain which appears related to malpositioning. Pain was improved end of session.PERFORMANCE DEFICITS: in functional skills including ADLs, IADLs, coordination, dexterity, sensation, strength, flexibility, Fine motor control, Gross motor control, mobility, balance, decreased knowledge of precautions, decreased knowledge of use of DME, and UE functional use, cognitive skills including attention, memory, orientation, problem solving, safety awareness, sequencing, temperament/personality, thought, and understand, and psychosocial skills including coping strategies, environmental adaptation, habits, interpersonal interactions, and routines and behaviors.   IMPAIRMENTS: are limiting patient from ADLs, IADLs, rest and sleep, education, work, play, leisure, and social participation.   CO-MORBIDITIES: may have co-morbidities  that affects occupational performance. Patient will benefit from skilled OT to address above impairments and improve overall  function.  MODIFICATION OR ASSISTANCE TO COMPLETE EVALUATION: No modification of tasks or assist necessary to complete an evaluation.  OT OCCUPATIONAL PROFILE AND HISTORY: Detailed assessment: Review of records and additional review of physical, cognitive, psychosocial history related to current functional performance.  CLINICAL DECISION MAKING: LOW - limited treatment options, no task modification necessary  REHAB POTENTIAL: Good  EVALUATION COMPLEXITY: Low    PLAN:  OT FREQUENCY: 8 visits plus eval  OT DURATION: 12 weeks  PLANNED INTERVENTIONS: 97168 OT Re-evaluation, 97535 self care/ADL training, 02889 therapeutic exercise, 97530 therapeutic activity, 97112 neuromuscular re-education, 97035 ultrasound, 97018 paraffin, 02989 moist heat, 97129 Cognitive training (first 15 min), 02869 Cognitive training(each additional 15 min), passive range of motion, balance training, functional mobility training, energy conservation, coping strategies  training, patient/family education, and DME and/or AE instructions  RECOMMENDED OTHER SERVICES: n/a  CONSULTED AND AGREED WITH PLAN OF CARE: Patient and family member/caregiver  PLAN FOR NEXT SESSION:  RUE coordination and strength, monitor shoulder pain   Suesan Mohrmann, OT 08/17/2024, 1:49 PM

## 2024-08-20 ENCOUNTER — Other Ambulatory Visit (HOSPITAL_BASED_OUTPATIENT_CLINIC_OR_DEPARTMENT_OTHER): Payer: Self-pay

## 2024-08-24 ENCOUNTER — Ambulatory Visit: Admitting: Occupational Therapy

## 2024-08-24 DIAGNOSIS — M6281 Muscle weakness (generalized): Secondary | ICD-10-CM

## 2024-08-24 DIAGNOSIS — R278 Other lack of coordination: Secondary | ICD-10-CM

## 2024-08-24 DIAGNOSIS — M79601 Pain in right arm: Secondary | ICD-10-CM

## 2024-08-24 DIAGNOSIS — R41840 Attention and concentration deficit: Secondary | ICD-10-CM

## 2024-08-24 DIAGNOSIS — R41844 Frontal lobe and executive function deficit: Secondary | ICD-10-CM

## 2024-08-24 DIAGNOSIS — R2689 Other abnormalities of gait and mobility: Secondary | ICD-10-CM

## 2024-08-24 NOTE — Therapy (Unsigned)
 OUTPATIENT OCCUPATIONAL THERAPY NEURO Treatment  Patient Name: Bruce Yang MRN: [id number] DOB:1970-10-26, 53 y.o., male Today's Date: 08/25/2024  PCP: [name], MD REFERRING PROVIDER: Lauraine Born, NP  END OF SESSION:  OT End of Session - 08/24/24 1525     Visit Number 5    Number of Visits 15    Date for Recertification  11/17/24    Authorization Type [employer]    Authorization Time Period 12 weeks    Authorization - Visit Number 5    OT Start Time 1450    OT Stop Time 1530    OT Time Calculation (min) 40 min    Activity Tolerance Patient tolerated treatment well    Behavior During Therapy WFL for tasks assessed/performed             Past Medical History:  Diagnosis Date   Diabetes mellitus without complication (HCC)    HIV disease (HCC)    Hyperlipidemia    Hypertension    Recurrent boils of anus    Stroke (HCC)    No past surgical history on file. Patient Active Problem List   Diagnosis Date Noted   Incidental pulmonary nodule, greater than or equal to 8mm 04/28/2024   Microalbuminuria 04/27/2024   Coronary artery disease 04/26/2024   HFrEF (heart failure with reduced ejection fraction) (HCC) 04/26/2024   Depression, major, single episode, mild 04/06/2024   Tendinitis of right rotator cuff 04/06/2024   Acute arthritis- Left knee 04/06/2024   Type 2 diabetes mellitus with cardiac complication (HCC) 02/02/2024   Cellulitis of right lower extremity 02/02/2024   History of CVA (cerebrovascular accident) 11/19/2023   Essential hypertension 10/21/2023   Erectile dysfunction 10/21/2023   Aortic atherosclerosis 10/21/2023   Carotid artery disease 10/21/2023   G6PD deficiency 10/17/2023   HIV disease (HCC) 10/15/2023   Therapeutic drug monitoring 10/15/2023   Alcohol use 10/14/2023   Substance abuse (HCC) 10/14/2023   Cardiac murmur 10/14/2023   Methamphetamine use 10/14/2023   Spondylolisthesis, lumbosacral region 09/25/2023   Hyperlipidemia  08/20/2023   Chronic low back pain without sciatica 08/19/2023   Peripheral neuropathy 08/19/2023   Tobacco use disorder 08/19/2023   Marijuana use, continuous 08/19/2023    ONSET DATE: 06/09/24  REFERRING DIAG:  Diagnosis  I63.9 (ICD-10-CM) - Acute CVA (cerebrovascular accident) (HCC)    THERAPY DIAG:  Other lack of coordination  Muscle weakness (generalized)  Attention and concentration deficit  Frontal lobe and executive function deficit  Other abnormalities of gait and mobility  Pain in right arm  Rationale for Evaluation and Treatment: Rehabilitation  SUBJECTIVE:   SUBJECTIVE STATEMENT: Pt reports no shoulder pain today  accompanied by: significant other  PERTINENT HISTORY: 53 y.o. male with medical history significant of polysubstance abuse, chronic low back pain presented to ED with complaint of numbness of his left face/arm/leg X 3 days.  In the ED, vital signs fairly stable.  Labs showed ethanol level 164, UDS positive for amphetamines and THC.  CT head showing new low density within the anterior right thalamus consistent with an infarction, exact age indeterminate. Brain MRI showing a 1 cm acute ischemic nonhemorrhagic right thalamic infarct and a 2 cm acute to early subacute ischemic nonhemorrhagic left basal ganglia infarct. MRI of C-spine negative for acute abnormality. Neurology recommended admission to [facility] for stroke workup. Patient passed his swallow study in the ED. [facility] called to admit.   First stroke was March 31st, second one was 10/13/23  PRECAUTIONS: Other: sensory impairment L  hand, HIV  WEIGHT BEARING RESTRICTIONS: No  PAIN: PAIN:  Are you having pain? Yes: NPRS scale: 5/10 Pain location: right shoulder and upper arm Pain description: aching Aggravating factors: malpositioning Relieving factors: heat     FALLS: Has patient fallen in last 6 months? No  LIVING ENVIRONMENT: Lives with: lives with an adult companion Lives in:  Other motel Stairs: Yes: External:    PLOF: Needs assistance with ADLs  PATIENT GOALS: improve use of left hand  OBJECTIVE:  Note: Objective measures were completed at Evaluation unless otherwise noted.  HAND DOMINANCE: Left  ADLs: Overall ADLs: increased time due to LUE weakness Transfers/ambulation related to ADLs: Eating: needs assistance with cutting food Grooming: mod I UB Dressing: mod I LB Dressing: mod I Toileting: mod I Bathing: needs assist at times Tub Shower transfers: bathtub and shower supervision   IADLs: Shopping: needs assist Light housekeeping: dependent Meal Prep: dependent Community mobility: mod I to supervision Medication management: girlfriend assists Financial management: girlfriend assists Handwriting: 100% legible  MOBILITY STATUS: mod I     FUNCTIONAL OUTCOME MEASURES: Quick Dash: 80% disability  UPPER EXTREMITY ROM:  grossly WFLS    UPPER EXTREMITY MMT:     MMT Right eval Left eval  Shoulder flexion 3+/5 4-/5  Shoulder abduction    Shoulder adduction    Shoulder extension    Shoulder internal rotation    Shoulder external rotation    Middle trapezius    Lower trapezius    Elbow flexion 4/5 4-/5  Elbow extension    Wrist flexion    Wrist extension    Wrist ulnar deviation    Wrist radial deviation    Wrist pronation    Wrist supination    (Blank rows = not tested)  HAND FUNCTION: Grip strength: Right: 60 lbs; Left: 70 lbs  COORDINATION: 9 Hole Peg test: Right: 43.44 sec; Left: 29.37 sec  SENSATION: Light touch: Impaired  for LUE    COGNITION: Overall cognitive status: delayed processing 0/3 words following short delay, omitted r when spelling WORLD backwards, pt was abl to complete correctly on second trial       OBSERVATIONS: Pleasant male known to this therapist from previous OT accompanied by his GF.                                                                                                                              TREATMENT DATE: 08/24/24- Therapist checked progress towards goals in prep for requesting more insurance visits. see goals below. Pt denies pain today Pt ambulated 50 ft while carrying glass of water in 1 hand and a plate in the other without drops. Placing and removing metal pegs from pegboard with RUE  for increased fine motor coordination, min difficulty/ v.c removing with in hand manipulation. shoulder flexion RUE 120, LUE 140 Grip strength: RUE 78 lbs, LUE 140 Quick DASH: 42.5% disability  08/10/24- Pt reports R shoulder pain today, it appears to  be related to malpositioning and internal rotation and scapular elevation on right side. Supine closed chain shoulder flexion and chest  pres, min v.c. Pt was pain free Seated low range shoulder flexion with R shoulder pain, v.c for proper positioning.  Hotpack applied to right shoulder x 10 mins while performing activities at the tabletop.no adrese reactions. Reduced pain following use.  Gripper set at level 2 to pick up 1 inch blocks, min v.c. Placing grooved pegs in to pegboard with RUE then removing for increased fine motor coordination, min difficulty/v.c  Therapsit educated pt/ GF regarding positioning to avoid to minimize shoulder pain. Therapsit recommends pt avoids resisted biceps curls due to pain. Pt was encouraged to perfrom fine motor coordination tasks at home to improve coordiantion.    07/29/24-Reveiwed red theraband exercises 10-15 reps each min v.c Pt was instructed in coordination HEP, he returned demonstration.  Gripper set at level 4 to pick up blocks with RUE, min difficulty/ v.c 1 rest break required.  UBE x 5 mins level 3 for conditioning  07/20/24- Pt was educated regarding red theraband HEP, see pt instructions, min v.c Pt was issued green putty for sustained R grip and tip pinch see pt instructions. Placing and removing metal pegs from pegboard with RUE for increased fine motor coordination,  min difficulty/ v.c   06/28/24- eval         PATIENT EDUCATION: Education details: plans to request more visits Person educated: Patient, GF Education method: Explanation, demonstration, handouts Education comprehension: verbalized understanding, returned demonstration,   HOME EXERCISE PROGRAM: theraband, coordination supine cane   GOALS: Goals reviewed with patient? Yes  SHORT TERM GOALS: Target date: 09/23/24  I with inital HEP Baseline:dependent Goal status: ongoing, needs reinforcement 08/24/24  2.  Pt will increase RUE grip strength by 5 lbs for increased functional use. Baseline: RUE 60 lbs, LUE 70 lbs Goal status: met 70 lbs 07/29/24  3.  Pt will perfrom all basic ADLs mod I consistently. Baseline: Pt requires assist for bathing thoroughness at times. Goal status: met, 07/29/24  4.  Pt/ caregiver will verbalize understanding of cognitve compensations. Baseline: dependent Goal status: ongoing 07/29/24  5. Pt will demonstrate ability to cut food mod I using bilateral UE's  Baseline: needs assist  Goal status: met, 08/10/24  LONG TERM GOALS: Target date: 11/10/24  I with updated HEP Baseline: dependent Goal status: ongoing 08/24/24  2.  Pt will improve Quick DASH score to 70% or better Upgraded goal:Pt will improve Quick DASH score to 35% or better Baseline: 80% disability Goal status: met, 42.5% 08/24/24, continue with upgraded goal  3.  Pt will demonstrate improved RUE fine motor coordination as evidenced by decreasing RUE 9-hole peg test score to 38 secs or less Revised goal: Pt will demonstrate improved RUE fine motor coordination as evidenced by decreasing RUE 9-hole peg test score to 40 secs or less  Baseline: RUE 43.44, LUE 29.37 Goal status: not met- work towards revised goal,  RUE 9 hole 50 secs, 61 secs 08/24/24  4.  Pt will demonstrate ability to carry a plate in 1 hand and a cup in the other without drops/ spills. Baseline: Pt has spills  when carrying items in both hands Goal status: met, 08/24/24  5.  Pt will demonstrate ability to retrieve a 5 # object from overhead shelf with left and right UE's individually without drops.Revised goal: Pt will demonstrate ability to retrieve a 5 # object from overhead shelf with right UE's emonstrating good control  without drops. Baseline: decreased bilateral strength with functional reach Goal status: met for left hand not for right, continue with updated goal 08/24/24   ASSESSMENT:  CLINICAL IMPRESSION: Patient is progressing towards goals.He has achieved 3/5 short term goals and 2/5 long term goals. Pt has not fully achieved all long term goals as he has only been seen for 4 visits plus eval. Pt can benefit from skilled occupational therapy to address the following deficits: decreased RUE strength, coordination, functional use, and pain in order to maximize pt's safety and I with ADLs/IADLS.   PERFORMANCE DEFICITS: in functional skills including ADLs, IADLs, coordination, dexterity, sensation, strength, flexibility, Fine motor control, Gross motor control, mobility, balance, decreased knowledge of precautions, decreased knowledge of use of DME, and UE functional use, cognitive skills including attention, memory, orientation, problem solving, safety awareness, sequencing, temperament/personality, thought, and understand, and psychosocial skills including coping strategies, environmental adaptation, habits, interpersonal interactions, and routines and behaviors.   IMPAIRMENTS: are limiting patient from ADLs, IADLs, rest and sleep, education, work, play, leisure, and social participation.   CO-MORBIDITIES: may have co-morbidities  that affects occupational performance. Patient will benefit from skilled OT to address above impairments and improve overall function.  MODIFICATION OR ASSISTANCE TO COMPLETE EVALUATION: No modification of tasks or assist necessary to complete an evaluation.  OT  OCCUPATIONAL PROFILE AND HISTORY: Detailed assessment: Review of records and additional review of physical, cognitive, psychosocial history related to current functional performance.  CLINICAL DECISION MAKING: LOW - limited treatment options, no task modification necessary  REHAB POTENTIAL: Good  EVALUATION COMPLEXITY: Low    PLAN:  OT FREQUENCY: 10 visits  OT DURATION: 10 weeks  PLANNED INTERVENTIONS: 97168 OT Re-evaluation, 97535 self care/ADL training, 02889 therapeutic exercise, 97530 therapeutic activity, 97112 neuromuscular re-education, 97035 ultrasound, 97018 paraffin, 02989 moist heat, 97129 Cognitive training (first 15 min), 02869 Cognitive training(each additional 15 min), passive range of motion, balance training, functional mobility training, energy conservation, coping strategies training, patient/family education, and DME and/or AE instructions  RECOMMENDED OTHER SERVICES: n/a  CONSULTED AND AGREED WITH PLAN OF CARE: Patient and family member/caregiver  PLAN FOR NEXT SESSION:  RUE coordination and strength, monitor shoulder pain, cognitve compensations   Ceairra Mccarver, OT 08/25/2024, 8:19 AM

## 2024-08-27 ENCOUNTER — Other Ambulatory Visit (HOSPITAL_BASED_OUTPATIENT_CLINIC_OR_DEPARTMENT_OTHER): Payer: Self-pay

## 2024-08-30 ENCOUNTER — Other Ambulatory Visit (HOSPITAL_COMMUNITY): Payer: Self-pay

## 2024-08-30 ENCOUNTER — Ambulatory Visit

## 2024-08-30 ENCOUNTER — Other Ambulatory Visit: Payer: Self-pay

## 2024-08-30 DIAGNOSIS — M6281 Muscle weakness (generalized): Secondary | ICD-10-CM

## 2024-08-30 DIAGNOSIS — R260 Ataxic gait: Secondary | ICD-10-CM

## 2024-08-30 DIAGNOSIS — I6389 Other cerebral infarction: Secondary | ICD-10-CM

## 2024-08-30 DIAGNOSIS — M79602 Pain in left arm: Secondary | ICD-10-CM

## 2024-08-30 DIAGNOSIS — M79601 Pain in right arm: Secondary | ICD-10-CM

## 2024-08-30 DIAGNOSIS — R278 Other lack of coordination: Secondary | ICD-10-CM

## 2024-08-30 DIAGNOSIS — R2689 Other abnormalities of gait and mobility: Secondary | ICD-10-CM | POA: Diagnosis not present
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
## 2024-09-01 ENCOUNTER — Other Ambulatory Visit: Payer: Self-pay

## 2024-09-01 ENCOUNTER — Other Ambulatory Visit (HOSPITAL_COMMUNITY): Payer: Self-pay

## 2024-09-01 ENCOUNTER — Other Ambulatory Visit: Payer: Self-pay | Admitting: Pharmacist

## 2024-09-01 DIAGNOSIS — B20 Human immunodeficiency virus [HIV] disease: Secondary | ICD-10-CM

## 2024-09-01 MED ORDER — CABOTEGRAVIR & RILPIVIRINE ER 600 & 900 MG/3ML IM SUER: 1.0000 | Freq: Once | INTRAMUSCULAR | Status: DC

## 2024-09-01 MED ORDER — CABOTEGRAVIR & RILPIVIRINE ER 600 & 900 MG/3ML IM SUER
1.0000 | INTRAMUSCULAR | 1 refills | Status: AC
  Filled 2024-09-01: qty 6, 30d supply, fill #0
  Filled ????-??-??: fill #1

## 2024-09-01 NOTE — Progress Notes (Signed)
 Specialty Pharmacy Initial Fill Coordination Note  Bruce Yang is a 53 y.o. male contacted today regarding initial fill of specialty medication(s) Cabotegravir  & Rilpivirine  (CABENUVA )   Patient requested Courier to Provider Office   Delivery date: 09/06/24   Verified address: [address] [place] [address]   Medication will be filled on 09/03/24.   Patient is aware of 0.00 copayment.

## 2024-09-02 ENCOUNTER — Encounter (INDEPENDENT_AMBULATORY_CARE_PROVIDER_SITE_OTHER): Payer: Self-pay

## 2024-09-03 ENCOUNTER — Other Ambulatory Visit (HOSPITAL_BASED_OUTPATIENT_CLINIC_OR_DEPARTMENT_OTHER): Payer: Self-pay

## 2024-09-03 ENCOUNTER — Other Ambulatory Visit: Payer: Self-pay

## 2024-09-06 ENCOUNTER — Other Ambulatory Visit: Payer: Self-pay | Admitting: Family Medicine

## 2024-09-06 ENCOUNTER — Telehealth: Payer: Self-pay

## 2024-09-06 ENCOUNTER — Other Ambulatory Visit (HOSPITAL_BASED_OUTPATIENT_CLINIC_OR_DEPARTMENT_OTHER): Payer: Self-pay

## 2024-09-06 DIAGNOSIS — E1165 Type 2 diabetes mellitus with hyperglycemia: Secondary | ICD-10-CM

## 2024-09-06 NOTE — Telephone Encounter (Signed)
 [facility] Patient Advocate Encounter  Patient's medications Cabenuva  have been couriered to [facility] from [facility] and will be administered at the patients appointment on 09/10/24.  Arland Hutchinson, CPhT Specialty Pharmacy Patient [facility] Phone: [phone] Fax:  [phone]

## 2024-09-07 ENCOUNTER — Ambulatory Visit: Admitting: Occupational Therapy

## 2024-09-07 ENCOUNTER — Ambulatory Visit: Admitting: Physical Therapy

## 2024-09-07 ENCOUNTER — Other Ambulatory Visit (HOSPITAL_BASED_OUTPATIENT_CLINIC_OR_DEPARTMENT_OTHER): Payer: Self-pay

## 2024-09-07 ENCOUNTER — Encounter: Payer: Self-pay | Admitting: Physical Therapy

## 2024-09-07 DIAGNOSIS — M6281 Muscle weakness (generalized): Secondary | ICD-10-CM

## 2024-09-07 DIAGNOSIS — R41844 Frontal lobe and executive function deficit: Secondary | ICD-10-CM | POA: Diagnosis present

## 2024-09-07 DIAGNOSIS — M79601 Pain in right arm: Secondary | ICD-10-CM | POA: Insufficient documentation

## 2024-09-07 DIAGNOSIS — R41840 Attention and concentration deficit: Secondary | ICD-10-CM | POA: Diagnosis present

## 2024-09-07 DIAGNOSIS — R2689 Other abnormalities of gait and mobility: Secondary | ICD-10-CM

## 2024-09-07 DIAGNOSIS — M79602 Pain in left arm: Secondary | ICD-10-CM | POA: Diagnosis present

## 2024-09-07 DIAGNOSIS — I6389 Other cerebral infarction: Secondary | ICD-10-CM | POA: Insufficient documentation

## 2024-09-07 DIAGNOSIS — R260 Ataxic gait: Secondary | ICD-10-CM | POA: Insufficient documentation

## 2024-09-07 DIAGNOSIS — R278 Other lack of coordination: Secondary | ICD-10-CM

## 2024-09-07 MED ORDER — LANTUS SOLOSTAR 100 UNIT/ML ~~LOC~~ SOPN
16.0000 [IU] | PEN_INJECTOR | Freq: Every day | SUBCUTANEOUS | 3 refills | Status: AC
  Filled 2024-09-07 (×2): qty 12, 75d supply, fill #0
  Filled 2024-11-03: qty 12, 75d supply, fill #1

## 2024-09-07 NOTE — Therapy (Signed)
 OUTPATIENT OCCUPATIONAL THERAPY NEURO Treatment  Patient Name: Bruce Yang MRN: [id number] DOB:Sep 29, 1971, 53 y.o., male Today's Date: 09/07/2024  PCP: [name], MD REFERRING PROVIDER: Lauraine Born, NP  END OF SESSION:  OT End of Session - 09/07/24 1548     Visit Number 6    Number of Visits 15    Date for Recertification  11/10/24    Authorization Type [employer]    Authorization Time Period 12 weeks    OT Start Time 1533    OT Stop Time 1613    OT Time Calculation (min) 40 min    Activity Tolerance Patient tolerated treatment well    Behavior During Therapy WFL for tasks assessed/performed              Past Medical History:  Diagnosis Date   Diabetes mellitus without complication (HCC)    HIV disease (HCC)    Hyperlipidemia    Hypertension    Recurrent boils of anus    Stroke (HCC)    No past surgical history on file. Patient Active Problem List   Diagnosis Date Noted   Incidental pulmonary nodule, greater than or equal to 8mm 04/28/2024   Microalbuminuria 04/27/2024   Coronary artery disease 04/26/2024   HFrEF (heart failure with reduced ejection fraction) (HCC) 04/26/2024   Depression, major, single episode, mild 04/06/2024   Tendinitis of right rotator cuff 04/06/2024   Acute arthritis- Left knee 04/06/2024   Type 2 diabetes mellitus with cardiac complication (HCC) 02/02/2024   Cellulitis of right lower extremity 02/02/2024   History of CVA (cerebrovascular accident) 11/19/2023   Essential hypertension 10/21/2023   Erectile dysfunction 10/21/2023   Aortic atherosclerosis 10/21/2023   Carotid artery disease 10/21/2023   G6PD deficiency 10/17/2023   HIV disease (HCC) 10/15/2023   Therapeutic drug monitoring 10/15/2023   Alcohol use 10/14/2023   Substance abuse (HCC) 10/14/2023   Cardiac murmur 10/14/2023   Methamphetamine use 10/14/2023   Spondylolisthesis, lumbosacral region 09/25/2023   Hyperlipidemia 08/20/2023   Chronic low back pain  without sciatica 08/19/2023   Peripheral neuropathy 08/19/2023   Tobacco use disorder 08/19/2023   Marijuana use, continuous 08/19/2023    ONSET DATE: 06/09/24  REFERRING DIAG:  Diagnosis  I63.9 (ICD-10-CM) - Acute CVA (cerebrovascular accident) (HCC)    THERAPY DIAG:  Other lack of coordination  Muscle weakness (generalized)  Pain in right arm  Attention and concentration deficit  Frontal lobe and executive function deficit  Other abnormalities of gait and mobility  Rationale for Evaluation and Treatment: Rehabilitation  SUBJECTIVE:   SUBJECTIVE STATEMENT: Pt reports no shoulder pain today  accompanied by: significant other  PERTINENT HISTORY: 53 y.o. male with medical history significant of polysubstance abuse, chronic low back pain presented to ED with complaint of numbness of his left face/arm/leg X 3 days.  In the ED, vital signs fairly stable.  Labs showed ethanol level 164, UDS positive for amphetamines and THC.  CT head showing new low density within the anterior right thalamus consistent with an infarction, exact age indeterminate. Brain MRI showing a 1 cm acute ischemic nonhemorrhagic right thalamic infarct and a 2 cm acute to early subacute ischemic nonhemorrhagic left basal ganglia infarct. MRI of C-spine negative for acute abnormality. Neurology recommended admission to [facility] for stroke workup. Patient passed his swallow study in the ED. [facility] called to admit.   First stroke was March 31st, second one was 10/13/23  PRECAUTIONS: Other: sensory impairment L hand, HIV  WEIGHT BEARING RESTRICTIONS: No  PAIN: PAIN:  Are you having pain? Yes: NPRS scale: 5/10 Pain location: right shoulder and upper arm Pain description: aching Aggravating factors: malpositioning Relieving factors: heat     FALLS: Has patient fallen in last 6 months? No  LIVING ENVIRONMENT: Lives with: lives with an adult companion Lives in: Other motel Stairs: Yes: External:     PLOF: Needs assistance with ADLs  PATIENT GOALS: improve use of left hand  OBJECTIVE:  Note: Objective measures were completed at Evaluation unless otherwise noted.  HAND DOMINANCE: Left  ADLs: Overall ADLs: increased time due to LUE weakness Transfers/ambulation related to ADLs: Eating: needs assistance with cutting food Grooming: mod I UB Dressing: mod I LB Dressing: mod I Toileting: mod I Bathing: needs assist at times Tub Shower transfers: bathtub and shower supervision   IADLs: Shopping: needs assist Light housekeeping: dependent Meal Prep: dependent Community mobility: mod I to supervision Medication management: girlfriend assists Financial management: girlfriend assists Handwriting: 100% legible  MOBILITY STATUS: mod I     FUNCTIONAL OUTCOME MEASURES: Quick Dash: 80% disability  UPPER EXTREMITY ROM:  grossly WFLS    UPPER EXTREMITY MMT:     MMT Right eval Left eval  Shoulder flexion 3+/5 4-/5  Shoulder abduction    Shoulder adduction    Shoulder extension    Shoulder internal rotation    Shoulder external rotation    Middle trapezius    Lower trapezius    Elbow flexion 4/5 4-/5  Elbow extension    Wrist flexion    Wrist extension    Wrist ulnar deviation    Wrist radial deviation    Wrist pronation    Wrist supination    (Blank rows = not tested)  HAND FUNCTION: Grip strength: Right: 60 lbs; Left: 70 lbs  COORDINATION: 9 Hole Peg test: Right: 43.44 sec; Left: 29.37 sec  SENSATION: Light touch: Impaired  for LUE    COGNITION: Overall cognitive status: delayed processing 0/3 words following short delay, omitted r when spelling WORLD backwards, pt was abl to complete correctly on second trial       OBSERVATIONS: Pleasant male known to this therapist from previous OT accompanied by his GF.                                                                                                                              TREATMENT DATE: 09/07/24- supine closed chain chest press and shoulder flexion 20 reps each, min v.c followed by seated shoulder flexion/ chest press, min v.c Yellow theraband for bilateral rows and extension, min v.c demonstration. Gripper set at level 4 to pick up blocks with RUE, min difficutly several rest breaks. Placing grooved pegs into pegboard with RUE, then removing with in hand manipulation, mod difficulty v.c UBE x 5 mins level 3 for conditioning.    08/24/24- Therapist checked progress towards goals in prep for requesting more insurance visits. see goals below. Pt denies pain today Pt ambulated 50  ft while carrying glass of water in 1 hand and a plate in the other without drops. Placing and removing metal pegs from pegboard with RUE  for increased fine motor coordination, min difficulty/ v.c removing with in hand manipulation. shoulder flexion RUE 120, LUE 140 Grip strength: RUE 78 lbs, LUE 140 Quick DASH: 42.5% disability  08/10/24- Pt reports R shoulder pain today, it appears to be related to malpositioning and internal rotation and scapular elevation on right side. Supine closed chain shoulder flexion and chest  pres, min v.c. Pt was pain free Seated low range shoulder flexion with R shoulder pain, v.c for proper positioning.  Hotpack applied to right shoulder x 10 mins while performing activities at the tabletop.no adrese reactions. Reduced pain following use.  Gripper set at level 2 to pick up 1 inch blocks, min v.c. Placing grooved pegs in to pegboard with RUE then removing for increased fine motor coordination, min difficulty/v.c  Therapsit educated pt/ GF regarding positioning to avoid to minimize shoulder pain. Therapsit recommends pt avoids resisted biceps curls due to pain. Pt was encouraged to perfrom fine motor coordination tasks at home to improve coordiantion.    07/29/24-Reveiwed red theraband exercises 10-15 reps each min v.c Pt was instructed in coordination  HEP, he returned demonstration.  Gripper set at level 4 to pick up blocks with RUE, min difficulty/ v.c 1 rest break required.  UBE x 5 mins level 3 for conditioning  07/20/24- Pt was educated regarding red theraband HEP, see pt instructions, min v.c Pt was issued green putty for sustained R grip and tip pinch see pt instructions. Placing and removing metal pegs from pegboard with RUE for increased fine motor coordination, min difficulty/ v.c   06/28/24- eval         PATIENT EDUCATION: Education details:  see above, education regarding memory compensations Person educated: Patient, GF Education method: Explanation, demonstration, handouts Education comprehension: verbalized understanding, returned demonstration,   HOME EXERCISE PROGRAM: theraband, coordination supine cane   GOALS: Goals reviewed with patient? Yes  SHORT TERM GOALS: Target date: 09/23/24  I with inital HEP Baseline:dependent Goal status: ongoing, needs reinforcement 09/07/24  2.  Pt will increase RUE grip strength by 5 lbs for increased functional use. Baseline: RUE 60 lbs, LUE 70 lbs Goal status: met 70 lbs 07/29/24  3.  Pt will perfrom all basic ADLs mod I consistently. Baseline: Pt requires assist for bathing thoroughness at times. Goal status: met, 07/29/24  4.  Pt/ caregiver will verbalize understanding of cognitve compensations. Baseline: dependent Goal status:met memory strategies-09/27/24  5. Pt will demonstrate ability to cut food mod I using bilateral UE's  Baseline: needs assist  Goal status: met, 08/10/24  LONG TERM GOALS: Target date: 11/10/24  I with updated HEP Baseline: dependent Goal status: ongoing 08/24/24  2.  Pt will improve Quick DASH score to 70% or better Upgraded goal:Pt will improve Quick DASH score to 35% or better Baseline: 80% disability Goal status: met, 42.5% 08/24/24, continue with upgraded goal  3.  Pt will demonstrate improved RUE fine motor coordination as  evidenced by decreasing RUE 9-hole peg test score to 38 secs or less Revised goal: Pt will demonstrate improved RUE fine motor coordination as evidenced by decreasing RUE 9-hole peg test score to 40 secs or less  Baseline: RUE 43.44, LUE 29.37 Goal status: not met- work towards revised goal,  RUE 9 hole 50 secs, 61 secs 08/24/24  4.  Pt will demonstrate ability to carry a plate  in 1 hand and a cup in the other without drops/ spills. Baseline: Pt has spills when carrying items in both hands Goal status: met, 08/24/24  5.  Pt will demonstrate ability to retrieve a 5 # object from overhead shelf with left and right UE's individually without drops.Revised goal: Pt will demonstrate ability to retrieve a 5 # object from overhead shelf with right UE's emonstrating good control without drops. Baseline: decreased bilateral strength with functional reach Goal status: met for left hand not for right, continue with updated goal 08/24/24   ASSESSMENT:  CLINICAL IMPRESSION: Patient is progressing towards goals.He demonstrates decreased pain and improved RUE function. PERFORMANCE DEFICITS: in functional skills including ADLs, IADLs, coordination, dexterity, sensation, strength, flexibility, Fine motor control, Gross motor control, mobility, balance, decreased knowledge of precautions, decreased knowledge of use of DME, and UE functional use, cognitive skills including attention, memory, orientation, problem solving, safety awareness, sequencing, temperament/personality, thought, and understand, and psychosocial skills including coping strategies, environmental adaptation, habits, interpersonal interactions, and routines and behaviors.   IMPAIRMENTS: are limiting patient from ADLs, IADLs, rest and sleep, education, work, play, leisure, and social participation.   CO-MORBIDITIES: may have co-morbidities  that affects occupational performance. Patient will benefit from skilled OT to address above impairments and  improve overall function.  MODIFICATION OR ASSISTANCE TO COMPLETE EVALUATION: No modification of tasks or assist necessary to complete an evaluation.  OT OCCUPATIONAL PROFILE AND HISTORY: Detailed assessment: Review of records and additional review of physical, cognitive, psychosocial history related to current functional performance.  CLINICAL DECISION MAKING: LOW - limited treatment options, no task modification necessary  REHAB POTENTIAL: Good  EVALUATION COMPLEXITY: Low    PLAN:  OT FREQUENCY: 10 visits  OT DURATION: 10 weeks  PLANNED INTERVENTIONS: 97168 OT Re-evaluation, 97535 self care/ADL training, 02889 therapeutic exercise, 97530 therapeutic activity, 97112 neuromuscular re-education, 97035 ultrasound, 97018 paraffin, 02989 moist heat, 97129 Cognitive training (first 15 min), 02869 Cognitive training(each additional 15 min), passive range of motion, balance training, functional mobility training, energy conservation, coping strategies training, patient/family education, and DME and/or AE instructions  RECOMMENDED OTHER SERVICES: n/a  CONSULTED AND AGREED WITH PLAN OF CARE: Patient and family member/caregiver  PLAN FOR NEXT SESSION:  RUE coordination and strength, monitor shoulder pain,    Ebbie Cherry, OT 09/07/2024, 5:11 PM

## 2024-09-07 NOTE — Patient Instructions (Signed)
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
## 2024-09-09 NOTE — Progress Notes (Unsigned)
 I have evaluated the chart below and refer this patient to the CPP clinic for medication management, adjustment, and treatment.    NEW REFERRAL TO CPP CLINIC    HPI: Bruce Yang is a 53 y.o. male who presents to the [facility] pharmacy clinic for Cabenuva  administration.  Referring ID Physician: Dr. Overton   Patient Active Problem List   Diagnosis Date Noted   Incidental pulmonary nodule, greater than or equal to 8mm 04/28/2024   Microalbuminuria 04/27/2024   Coronary artery disease 04/26/2024   HFrEF (heart failure with reduced ejection fraction) (HCC) 04/26/2024   Depression, major, single episode, mild 04/06/2024   Tendinitis of right rotator cuff 04/06/2024   Acute arthritis- Left knee 04/06/2024   Type 2 diabetes mellitus with cardiac complication (HCC) 02/02/2024   Cellulitis of right lower extremity 02/02/2024   History of CVA (cerebrovascular accident) 11/19/2023   Essential hypertension 10/21/2023   Erectile dysfunction 10/21/2023   Aortic atherosclerosis 10/21/2023   Carotid artery disease 10/21/2023   G6PD deficiency 10/17/2023   HIV disease (HCC) 10/15/2023   Therapeutic drug monitoring 10/15/2023   Alcohol use 10/14/2023   Substance abuse (HCC) 10/14/2023   Cardiac murmur 10/14/2023   Methamphetamine use 10/14/2023   Spondylolisthesis, lumbosacral region 09/25/2023   Hyperlipidemia 08/20/2023   Chronic low back pain without sciatica 08/19/2023   Peripheral neuropathy 08/19/2023   Tobacco use disorder 08/19/2023   Marijuana use, continuous 08/19/2023    Patient's Medications  New Prescriptions   No medications on file  Previous Medications   ACETAMINOPHEN  (TYLENOL ) 500 MG TABLET    Take 1-2 tablets (500-1,000 mg total) by mouth every 6 (six) hours as needed for mild pain (pain score 1-3) or moderate pain (pain score 4-6).   AMLODIPINE  (NORVASC ) 5 MG TABLET    Take 1 tablet (5 mg total) by mouth daily.   AMOXICILLIN -CLAVULANATE (AUGMENTIN ) 875-125 MG TABLET     Take 1 tablet by mouth 2 (two) times daily.   ASPIRIN  EC 81 MG TABLET    Take 1 tablet (81 mg total) by mouth daily. Swallow whole.   ATORVASTATIN  (LIPITOR ) 80 MG TABLET    Take 1 tablet (80 mg total) by mouth daily.   BUPROPION  (WELLBUTRIN  SR) 150 MG 12 HR TABLET    Take 1 tablet daily for 3 days then take 1 tablet twice daily thereafter   CABOTEGRAVIR  & RILPIVIRINE  ER (CABENUVA ) 600 & 900 MG/3ML INJECTION    Inject 1 kit into the muscle every 30 (thirty) days.   CONTINUOUS GLUCOSE RECEIVER (DEXCOM G6 RECEIVER) DEVI    Use as directed to monitor glucose continuously.   CONTINUOUS GLUCOSE SENSOR (DEXCOM G6 SENSOR) MISC    Use as directed to monitor glucose continuously.   CONTINUOUS GLUCOSE TRANSMITTER (DEXCOM G6 TRANSMITTER) MISC    Use as directed and replace every 90 days.   DOLUTEGRAVIR -LAMIVUDINE  (DOVATO ) 50-300 MG TABLET    Take 1 tablet by mouth daily.   EMPAGLIFLOZIN  (JARDIANCE ) 10 MG TABS TABLET    Take 1 tablet (10 mg total) by mouth daily before breakfast.   EVOLOCUMAB  (REPATHA  SURECLICK) 140 MG/ML SOAJ    Inject 140 mg into the skin every 14 (fourteen) days.   EZETIMIBE  (ZETIA ) 10 MG TABLET    Take 1 tablet (10 mg total) by mouth daily.   FUROSEMIDE  (LASIX ) 20 MG TABLET    Take 1 tablet (20 mg total) by mouth as needed for fluid or edema.   INSULIN  GLARGINE (LANTUS  SOLOSTAR) 100 UNIT/ML SOLOSTAR PEN  Inject 16 Units into the skin daily.   INSULIN  GLARGINE-YFGN (SEMGLEE ) 100 UNIT/ML PEN    Inject 12 Units into the skin daily.   INSULIN  PEN NEEDLE (INSUPEN PEN NEEDLES) 32G X 4 MM MISC    Use once daily   MELOXICAM  (MOBIC ) 15 MG TABLET    TAKE 1 TABLET (15 MG TOTAL) BY MOUTH DAILY.   METOPROLOL  SUCCINATE (TOPROL -XL) 25 MG 24 HR TABLET    Take 1 tablet (25 mg total) by mouth daily.   SACUBITRIL -VALSARTAN  (ENTRESTO ) 49-51 MG    Take 1 tablet by mouth 2 (two) times daily.   SILDENAFIL  (VIAGRA ) 50 MG TABLET    Take 1 tablet (50 mg total) by mouth daily as needed for erectile dysfunction.   Modified Medications   No medications on file  Discontinued Medications   No medications on file    Allergies: Allergies  Allergen Reactions   Antihistamines, Chlorpheniramine-Type     Affects Prostate     Past Medical History: Past Medical History:  Diagnosis Date   Diabetes mellitus without complication (HCC)    HIV disease (HCC)    Hyperlipidemia    Hypertension    Recurrent boils of anus    Stroke [facility])     Social History: Social History   Socioeconomic History   Marital status: Significant Other    Spouse name: Not on file   Number of children: 8   Years of education: Not on file   Highest education level: Associate degree: occupational, scientist, product/process development, or vocational program  Occupational History   Occupation: Psychologist, Occupational  Tobacco Use   Smoking status: Every Day    Types: Cigarettes    Start date: 01/22/2024    Last attempt to quit: 10/07/1981    Years since quitting: 42.9    Passive exposure: Never   Smokeless tobacco: Never  Vaping Use   Vaping status: Never Used  Substance and Sexual Activity   Alcohol use: Not Currently    Alcohol/week: 2.0 standard drinks of alcohol    Types: 2 Shots of liquor per week   Drug use: Not Currently    Frequency: 5.0 times per week    Types: Marijuana   Sexual activity: Yes    Partners: Female  Other Topics Concern   Not on file  Social History Narrative   Left handed   Caffeine- 1 cup   Not currently working    Social Drivers of Health   Financial Resource Strain: Not on file  Food Insecurity: No Food Insecurity (10/14/2023)   Hunger Vital Sign    Worried About Running Out of Food in the Last Year: Never true    Ran Out of Food in the Last Year: Never true  Transportation Needs: Patient Declined (10/14/2023)   PRAPARE - Administrator, Civil Service (Medical): Patient declined    Lack of Transportation (Non-Medical): Patient declined  Physical Activity: Not on file  Stress: Not on file  Social Connections:  Not on file    Labs: Lab Results  Component Value Date   HIV1RNAQUANT 37 (H) 07/19/2024   HIV1RNAQUANT 33 (H) 02/12/2024   HIV1RNAQUANT 202 (H) 10/27/2023    RPR and STI Lab Results  Component Value Date   LABRPR NON REACTIVE 10/14/2023        No data to display          Hepatitis B Lab Results  Component Value Date   HEPBSAG NON-REACTIVE 02/12/2024   HEPBCAB NON-REACTIVE 02/12/2024   Hepatitis C  Lab Results  Component Value Date   HEPCAB NON-REACTIVE 02/12/2024   Hepatitis A No results found for: HAV Lipids: Lab Results  Component Value Date   CHOL 263 (H) 05/20/2024   TRIG 83 05/20/2024   HDL 69 05/20/2024   CHOLHDL 3.8 05/20/2024   VLDL 20 10/14/2023   LDLCALC 180 (H) 05/20/2024    Current HIV Regimen: Dovato   TARGET DATE: The 5th of the month  Assessment: Jeferson presents today for their first initiation injection for Cabenuva . Counseled that Cabenuva  is two separate intramuscular injections in the gluteal muscle on each side for each visit. Explained that the second injection is 30 days after the initial injection then every 2 months thereafter. Discussed the need for viral load monitoring every 2 months for the first 6 months and then periodically afterwards as their provider sees the need. Discussed the rare but significant chance of developing resistance despite compliance. Explained that showing up to injection appointments is very important and warned that if 2 appointments are missed, it will be reassessed by their provider whether they are a good candidate for injection therapy. Counseled on possible side effects associated with the injections such as injection site pain, which is usually mild to moderate in nature, injection site nodules, and injection site reactions. Asked to call the clinic or send me a mychart message if they experience any issues, such as fatigue, nausea, headache, rash, or dizziness. Advised that they can take ibuprofen  or  tylenol  for injection site pain if needed.   Administered cabotegravir  600mg /4mL in left upper outer quadrant of the gluteal muscle. Administered rilpivirine  900 mg/3mL in the right upper outer quadrant of the gluteal muscle. Monitored patient for 10 minutes after injection. Injections were tolerated well without issue. Counseled to stop taking Dovato  after today's dose and to call with any issues that may arise. Will make follow up appointments for second initiation injection in 30 days and then maintenance injections every 2 months thereafter for 6 months; will line up appointments with patient's partner who follows me for Apretude  management.   Patient is up-to-date on vaccines.   Plan: - Stop Dovato  - Administer first Cabenuva  injections  - Second initiation injection scheduled for 12/30 with me  - Call with any issues or questions  Alan Geralds, PharmD, CPP, BCIDP, AAHIVP Clinical Pharmacist Practitioner Infectious Diseases Clinical Pharmacist [facility]

## 2024-09-10 ENCOUNTER — Ambulatory Visit: Admitting: Pharmacist

## 2024-09-10 ENCOUNTER — Other Ambulatory Visit: Payer: Self-pay

## 2024-09-10 VITALS — Wt 169.0 lb

## 2024-09-10 DIAGNOSIS — B20 Human immunodeficiency virus [HIV] disease: Secondary | ICD-10-CM

## 2024-09-10 MED ORDER — CABOTEGRAVIR & RILPIVIRINE ER 600 & 900 MG/3ML IM SUER
1.0000 | Freq: Once | INTRAMUSCULAR | Status: AC
  Administered 2024-09-10: 1 via INTRAMUSCULAR

## 2024-09-14 ENCOUNTER — Other Ambulatory Visit: Payer: Self-pay

## 2024-09-14 ENCOUNTER — Ambulatory Visit: Admitting: Physical Therapy

## 2024-09-14 ENCOUNTER — Ambulatory Visit: Admitting: Occupational Therapy

## 2024-09-14 ENCOUNTER — Other Ambulatory Visit (HOSPITAL_COMMUNITY): Payer: Self-pay

## 2024-09-14 NOTE — Progress Notes (Signed)
 Specialty Pharmacy Refill Coordination Note  Bruce Yang is a 53 y.o. male assessed today regarding refills of clinic administered specialty medication(s) Cabotegravir  & Rilpivirine  (CABENUVA )   Clinic requested Courier to Provider Office   Delivery date: 09/28/24   Verified address: [address] [place] [address]   Medication will be filled on 09/27/24.

## 2024-09-16 ENCOUNTER — Ambulatory Visit

## 2024-09-16 DIAGNOSIS — R278 Other lack of coordination: Secondary | ICD-10-CM

## 2024-09-16 DIAGNOSIS — R2689 Other abnormalities of gait and mobility: Secondary | ICD-10-CM

## 2024-09-16 DIAGNOSIS — R260 Ataxic gait: Secondary | ICD-10-CM

## 2024-09-16 DIAGNOSIS — R41844 Frontal lobe and executive function deficit: Secondary | ICD-10-CM

## 2024-09-16 DIAGNOSIS — I6389 Other cerebral infarction: Secondary | ICD-10-CM

## 2024-09-16 DIAGNOSIS — M6281 Muscle weakness (generalized): Secondary | ICD-10-CM
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
## 2024-09-28 ENCOUNTER — Telehealth: Payer: Self-pay

## 2024-09-28 ENCOUNTER — Ambulatory Visit: Admitting: Occupational Therapy

## 2024-09-28 ENCOUNTER — Ambulatory Visit

## 2024-09-28 DIAGNOSIS — R278 Other lack of coordination: Secondary | ICD-10-CM

## 2024-09-28 DIAGNOSIS — R2689 Other abnormalities of gait and mobility: Secondary | ICD-10-CM

## 2024-09-28 DIAGNOSIS — M6281 Muscle weakness (generalized): Secondary | ICD-10-CM

## 2024-09-28 DIAGNOSIS — R41844 Frontal lobe and executive function deficit: Secondary | ICD-10-CM

## 2024-09-28 DIAGNOSIS — I6389 Other cerebral infarction: Secondary | ICD-10-CM

## 2024-09-28 DIAGNOSIS — M79602 Pain in left arm: Secondary | ICD-10-CM

## 2024-09-28 DIAGNOSIS — R260 Ataxic gait: Secondary | ICD-10-CM

## 2024-09-28 DIAGNOSIS — R41840 Attention and concentration deficit: Secondary | ICD-10-CM

## 2024-09-28 NOTE — Therapy (Signed)
 " OUTPATIENT OCCUPATIONAL THERAPY NEURO Treatment  Patient Name: Bruce Yang MRN: [id number] DOB:11/07/1970, 53 y.o., male Today's Date: 09/28/2024  PCP: [name], MD REFERRING PROVIDER: Lauraine Born, NP OCCUPATIONAL THERAPY DISCHARGE SUMMARY    Current functional level related to goals / functional outcomes: Pt met 4/5 long term goals.   Remaining deficits: mildly decreased strength, decreased coordination, intermittant pain   Education / Equipment: Pt was educated regarding:memory compensations, HEP.  Pt / girlfriend verbalize understanding of all education.  Patient agrees to discharge. Patient goals were partially met. Patient is being discharged due to maximized rehab potential. .    END OF SESSION:  OT End of Session - 09/28/24 1534     Visit Number 7    Number of Visits 15    Date for Recertification  11/10/24    Authorization Time Period 12 weeks    Authorization - Visit Number 6    OT Start Time 1447    OT Stop Time 1525    OT Time Calculation (min) 38 min               Past Medical History:  Diagnosis Date   Diabetes mellitus without complication (HCC)    HIV disease (HCC)    Hyperlipidemia    Hypertension    Recurrent boils of anus    Stroke (HCC)    No past surgical history on file. Patient Active Problem List   Diagnosis Date Noted   Incidental pulmonary nodule, greater than or equal to 8mm 04/28/2024   Microalbuminuria 04/27/2024   Coronary artery disease 04/26/2024   HFrEF (heart failure with reduced ejection fraction) (HCC) 04/26/2024   Depression, major, single episode, mild 04/06/2024   Tendinitis of right rotator cuff 04/06/2024   Acute arthritis- Left knee 04/06/2024   Type 2 diabetes mellitus with cardiac complication (HCC) 02/02/2024   Cellulitis of right lower extremity 02/02/2024   History of CVA (cerebrovascular accident) 11/19/2023   Essential hypertension 10/21/2023   Erectile dysfunction 10/21/2023   Aortic  atherosclerosis 10/21/2023   Carotid artery disease 10/21/2023   G6PD deficiency 10/17/2023   HIV disease (HCC) 10/15/2023   Therapeutic drug monitoring 10/15/2023   Alcohol use 10/14/2023   Substance abuse (HCC) 10/14/2023   Cardiac murmur 10/14/2023   Methamphetamine use 10/14/2023   Spondylolisthesis, lumbosacral region 09/25/2023   Hyperlipidemia 08/20/2023   Chronic low back pain without sciatica 08/19/2023   Peripheral neuropathy 08/19/2023   Tobacco use disorder 08/19/2023   Marijuana use, continuous 08/19/2023    ONSET DATE: 06/09/24  REFERRING DIAG:  Diagnosis  I63.9 (ICD-10-CM) - Acute CVA (cerebrovascular accident) (HCC)    THERAPY DIAG:  Other lack of coordination  Muscle weakness (generalized)  Other abnormalities of gait and mobility  Frontal lobe and executive function deficit  Attention and concentration deficit  Pain in left arm  Rationale for Evaluation and Treatment: Rehabilitation  SUBJECTIVE:   SUBJECTIVE STATEMENT: Pt reports no shoulder pain today  accompanied by: significant other  PERTINENT HISTORY: 53 y.o. male with medical history significant of polysubstance abuse, chronic low back pain presented to ED with complaint of numbness of his left face/arm/leg X 3 days.  In the ED, vital signs fairly stable.  Labs showed ethanol level 164, UDS positive for amphetamines and THC.  CT head showing new low density within the anterior right thalamus consistent with an infarction, exact age indeterminate. Brain MRI showing a 1 cm acute ischemic nonhemorrhagic right thalamic infarct and a 2 cm acute to early  subacute ischemic nonhemorrhagic left basal ganglia infarct. MRI of C-spine negative for acute abnormality. Neurology recommended admission to [facility] for stroke workup. Patient passed his swallow study in the ED. [facility] called to admit.   First stroke was March 31st, second one was 10/13/23  PRECAUTIONS: Other: sensory impairment L hand,  HIV  WEIGHT BEARING RESTRICTIONS: No  PAIN: Are you having pain? no    FALLS: Has patient fallen in last 6 months? No  LIVING ENVIRONMENT: Lives with: lives with an adult companion Lives in: Other motel Stairs: Yes: External:    PLOF: Needs assistance with ADLs  PATIENT GOALS: improve use of left hand  OBJECTIVE:  Note: Objective measures were completed at Evaluation unless otherwise noted.  HAND DOMINANCE: Left  ADLs: Overall ADLs: increased time due to LUE weakness Transfers/ambulation related to ADLs: Eating: needs assistance with cutting food Grooming: mod I UB Dressing: mod I LB Dressing: mod I Toileting: mod I Bathing: needs assist at times Tub Shower transfers: bathtub and shower supervision   IADLs: Shopping: needs assist Light housekeeping: dependent Meal Prep: dependent Community mobility: mod I to supervision Medication management: girlfriend assists Financial management: girlfriend assists Handwriting: 100% legible  MOBILITY STATUS: mod I     FUNCTIONAL OUTCOME MEASURES: Quick Dash: 80% disability  UPPER EXTREMITY ROM:  grossly WFLS    UPPER EXTREMITY MMT:     MMT Right eval Left eval  Shoulder flexion 3+/5 4-/5  Shoulder abduction    Shoulder adduction    Shoulder extension    Shoulder internal rotation    Shoulder external rotation    Middle trapezius    Lower trapezius    Elbow flexion 4/5 4-/5  Elbow extension    Wrist flexion    Wrist extension    Wrist ulnar deviation    Wrist radial deviation    Wrist pronation    Wrist supination    (Blank rows = not tested)  HAND FUNCTION: Grip strength: Right: 60 lbs; Left: 70 lbs  COORDINATION: 9 Hole Peg test: Right: 43.44 sec; Left: 29.37 sec  SENSATION: Light touch: Impaired  for LUE    COGNITION: Overall cognitive status: delayed processing 0/3 words following short delay, omitted r when spelling WORLD backwards, pt was abl to complete correctly on second  trial       OBSERVATIONS: Pleasant male known to this therapist from previous OT accompanied by his GF.                                                                                                                             TREATMENT DATE:  09/28/24- Standing cane exercises for: shoulder flexion, extension, abduction, chest press, 10 reps each min v.c. Pt was pain free with exercises Therapist checked progress towards goals and discussed plans for d/c, pt is in agreement. Grooved pegs for increased fine motor coordination, with RUE, min difficulty/ v.c removing with in hand manipulation. UBE x 8 mins level 3  for conditioning Gripper set at level 3 picking up 1 inch blocks with RUE for sustained grip  09/07/24- supine closed chain chest press and shoulder flexion 20 reps each, min v.c followed by seated shoulder flexion/ chest press, min v.c Yellow theraband for bilateral rows and extension, min v.c demonstration. Gripper set at level 4 to pick up blocks with RUE, min difficutly several rest breaks. Placing grooved pegs into pegboard with RUE, then removing with in hand manipulation, mod difficulty v.c UBE x 5 mins level 3 for conditioning.    08/24/24- Therapist checked progress towards goals in prep for requesting more insurance visits. see goals below. Pt denies pain today Pt ambulated 50 ft while carrying glass of water in 1 hand and a plate in the other without drops. Placing and removing metal pegs from pegboard with RUE  for increased fine motor coordination, min difficulty/ v.c removing with in hand manipulation. shoulder flexion RUE 120, LUE 140 Grip strength: RUE 78 lbs, LUE 140 Quick DASH: 42.5% disability  08/10/24- Pt reports R shoulder pain today, it appears to be related to malpositioning and internal rotation and scapular elevation on right side. Supine closed chain shoulder flexion and chest  pres, min v.c. Pt was pain free Seated low range shoulder flexion with  R shoulder pain, v.c for proper positioning.  Hotpack applied to right shoulder x 10 mins while performing activities at the tabletop.no adrese reactions. Reduced pain following use.  Gripper set at level 2 to pick up 1 inch blocks, min v.c. Placing grooved pegs in to pegboard with RUE then removing for increased fine motor coordination, min difficulty/v.c  Therapsit educated pt/ GF regarding positioning to avoid to minimize shoulder pain. Therapsit recommends pt avoids resisted biceps curls due to pain. Pt was encouraged to perfrom fine motor coordination tasks at home to improve coordiantion.    07/29/24-Reveiwed red theraband exercises 10-15 reps each min v.c Pt was instructed in coordination HEP, he returned demonstration.  Gripper set at level 4 to pick up blocks with RUE, min difficulty/ v.c 1 rest break required.  UBE x 5 mins level 3 for conditioning  07/20/24- Pt was educated regarding red theraband HEP, see pt instructions, min v.c Pt was issued green putty for sustained R grip and tip pinch see pt instructions. Placing and removing metal pegs from pegboard with RUE for increased fine motor coordination, min difficulty/ v.c   06/28/24- eval         PATIENT EDUCATION: Education details:  see above, plans for d/c cane exercises Person educated: Patient, GF Education method: Explanation, demonstration,  Education comprehension: verbalized understanding, returned demonstration,   HOME EXERCISE PROGRAM: theraband, coordination supine cane standing cane  GOALS: Goals reviewed with patient? Yes  SHORT TERM GOALS: Target date: 09/23/24  I with inital HEP Baseline:dependent Goal status: met, 09/28/24  2.  Pt will increase RUE grip strength by 5 lbs for increased functional use. Baseline: RUE 60 lbs, LUE 70 lbs Goal status: met 70 lbs 07/29/24  3.  Pt will perfrom all basic ADLs mod I consistently. Baseline: Pt requires assist for bathing thoroughness at times. Goal  status: met, 07/29/24  4.  Pt/ caregiver will verbalize understanding of cognitve compensations. Baseline: dependent Goal status:met memory strategies-09/27/24  5. Pt will demonstrate ability to cut food mod I using bilateral UE's  Baseline: needs assist  Goal status: met, 08/10/24  LONG TERM GOALS: Target date: 11/10/24  I with updated HEP Baseline: dependent Goal status: met 09/28/24  2.  Pt will improve Quick DASH score to 70% or better Upgraded goal:Pt will improve Quick DASH score to 35% or better Baseline: 80% disability Goal status: met, 42.5% 08/24/24, met 22.7% 09/28/24  3.  Pt will demonstrate improved RUE fine motor coordination as evidenced by decreasing RUE 9-hole peg test score to 38 secs or less Revised goal: Pt will demonstrate improved RUE fine motor coordination as evidenced by decreasing RUE 9-hole peg test score to 40 secs or less  Baseline: RUE 43.44, LUE 29.37 Goal status: not met 44.73 secs 09/28/24  4.  Pt will demonstrate ability to carry a plate in 1 hand and a cup in the other without drops/ spills. Baseline: Pt has spills when carrying items in both hands Goal status: met, 08/24/24  5.  Pt will demonstrate ability to retrieve a 5 # object from overhead shelf with left and right UE's individually without drops.Revised goal: Pt will demonstrate ability to retrieve a 5 # object from overhead shelf with right UE's demonstrating good control without drops. Baseline: decreased bilateral strength with functional reach Goal status: met for both hands 09/28/24   ASSESSMENT:  CLINICAL IMPRESSION: Patient is progressing towards goals.He demonstrates decreased pain and improved RUE function. PERFORMANCE DEFICITS: in functional skills including ADLs, IADLs, coordination, dexterity, sensation, strength, flexibility, Fine motor control, Gross motor control, mobility, balance, decreased knowledge of precautions, decreased knowledge of use of DME, and UE functional use,  cognitive skills including attention, memory, orientation, problem solving, safety awareness, sequencing, temperament/personality, thought, and understand, and psychosocial skills including coping strategies, environmental adaptation, habits, interpersonal interactions, and routines and behaviors.   IMPAIRMENTS: are limiting patient from ADLs, IADLs, rest and sleep, education, work, play, leisure, and social participation.   CO-MORBIDITIES: may have co-morbidities  that affects occupational performance. Patient will benefit from skilled OT to address above impairments and improve overall function.  MODIFICATION OR ASSISTANCE TO COMPLETE EVALUATION: No modification of tasks or assist necessary to complete an evaluation.  OT OCCUPATIONAL PROFILE AND HISTORY: Detailed assessment: Review of records and additional review of physical, cognitive, psychosocial history related to current functional performance.  CLINICAL DECISION MAKING: LOW - limited treatment options, no task modification necessary  REHAB POTENTIAL: Good  EVALUATION COMPLEXITY: Low    PLAN:  OT FREQUENCY: 10 visits  OT DURATION: 10 weeks  PLANNED INTERVENTIONS: 97168 OT Re-evaluation, 97535 self care/ADL training, 02889 therapeutic exercise, 97530 therapeutic activity, 97112 neuromuscular re-education, 97035 ultrasound, 97018 paraffin, 02989 moist heat, 97129 Cognitive training (first 15 min), 02869 Cognitive training(each additional 15 min), passive range of motion, balance training, functional mobility training, energy conservation, coping strategies training, patient/family education, and DME and/or AE instructions  RECOMMENDED OTHER SERVICES: n/a  CONSULTED AND AGREED WITH PLAN OF CARE: Patient and family member/caregiver  PLAN FOR NEXT SESSION:  d/c OT   Barton Want, OT 09/28/2024, 3:37 PM           "
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
## 2024-09-28 NOTE — Telephone Encounter (Signed)
 [facility] Patient Advocate Encounter  Patient's medications CABENUVA  have been couriered to [facility] from [facility] and will be administered at the patients appointment on 10/05/24.  Charmaine Sharps, CPhT Special[facility] Pharmacy Patient [facility] Phone: [phone] Fax:  [phone]

## 2024-10-01 ENCOUNTER — Other Ambulatory Visit (HOSPITAL_BASED_OUTPATIENT_CLINIC_OR_DEPARTMENT_OTHER): Payer: Self-pay

## 2024-10-04 ENCOUNTER — Other Ambulatory Visit (HOSPITAL_BASED_OUTPATIENT_CLINIC_OR_DEPARTMENT_OTHER): Payer: Self-pay

## 2024-10-04 ENCOUNTER — Other Ambulatory Visit: Payer: Self-pay

## 2024-10-04 ENCOUNTER — Other Ambulatory Visit: Payer: Self-pay | Admitting: Family Medicine

## 2024-10-04 MED ORDER — BUPROPION HCL ER (SR) 150 MG PO TB12
150.0000 mg | ORAL_TABLET | Freq: Two times a day (BID) | ORAL | 2 refills | Status: AC
  Filled 2024-10-04: qty 60, 30d supply, fill #0

## 2024-10-04 NOTE — Progress Notes (Unsigned)
 "  HPI: Bruce Yang is a 53 y.o. male who presents to the [facility] pharmacy clinic for Cabenuva  administration.  Referring ID Provider: Dr. Overton  Patient Active Problem List   Diagnosis Date Noted   Incidental pulmonary nodule, greater than or equal to 8mm 04/28/2024   Microalbuminuria 04/27/2024   Coronary artery disease 04/26/2024   HFrEF (heart failure with reduced ejection fraction) (HCC) 04/26/2024   Depression, major, single episode, mild 04/06/2024   Tendinitis of right rotator cuff 04/06/2024   Acute arthritis- Left knee 04/06/2024   Type 2 diabetes mellitus with cardiac complication (HCC) 02/02/2024   Cellulitis of right lower extremity 02/02/2024   History of CVA (cerebrovascular accident) 11/19/2023   Essential hypertension 10/21/2023   Erectile dysfunction 10/21/2023   Aortic atherosclerosis 10/21/2023   Carotid artery disease 10/21/2023   G6PD deficiency 10/17/2023   HIV disease (HCC) 10/15/2023   Therapeutic drug monitoring 10/15/2023   Alcohol use 10/14/2023   Substance abuse (HCC) 10/14/2023   Cardiac murmur 10/14/2023   Methamphetamine use 10/14/2023   Spondylolisthesis, lumbosacral region 09/25/2023   Hyperlipidemia 08/20/2023   Chronic low back pain without sciatica 08/19/2023   Peripheral neuropathy 08/19/2023   Tobacco use disorder 08/19/2023   Marijuana use, continuous 08/19/2023    Patient's Medications  New Prescriptions   No medications on file  Previous Medications   AMLODIPINE  (NORVASC ) 5 MG TABLET    Take 1 tablet (5 mg total) by mouth daily.   AMOXICILLIN -CLAVULANATE (AUGMENTIN ) 875-125 MG TABLET    Take 1 tablet by mouth 2 (two) times daily.   ATORVASTATIN  (LIPITOR ) 80 MG TABLET    Take 1 tablet (80 mg total) by mouth daily.   BUPROPION  (WELLBUTRIN  SR) 150 MG 12 HR TABLET    Take 1 tablet (150 mg total) by mouth 2 (two) times daily.   CABOTEGRAVIR  & RILPIVIRINE  ER (CABENUVA ) 600 & 900 MG/3ML INJECTION    Inject 1 kit into the muscle every 30  (thirty) days.   CONTINUOUS GLUCOSE RECEIVER (DEXCOM G6 RECEIVER) DEVI    Use as directed to monitor glucose continuously.   CONTINUOUS GLUCOSE SENSOR (DEXCOM G6 SENSOR) MISC    Use as directed to monitor glucose continuously.   CONTINUOUS GLUCOSE TRANSMITTER (DEXCOM G6 TRANSMITTER) MISC    Use as directed and replace every 90 days.   EMPAGLIFLOZIN  (JARDIANCE ) 10 MG TABS TABLET    Take 1 tablet (10 mg total) by mouth daily before breakfast.   EVOLOCUMAB  (REPATHA  SURECLICK) 140 MG/ML SOAJ    Inject 140 mg into the skin every 14 (fourteen) days.   EZETIMIBE  (ZETIA ) 10 MG TABLET    Take 1 tablet (10 mg total) by mouth daily.   FUROSEMIDE  (LASIX ) 20 MG TABLET    Take 1 tablet (20 mg total) by mouth as needed for fluid or edema.   INSULIN  GLARGINE (LANTUS  SOLOSTAR) 100 UNIT/ML SOLOSTAR PEN    Inject 16 Units into the skin daily.   INSULIN  PEN NEEDLE (INSUPEN PEN NEEDLES) 32G X 4 MM MISC    Use once daily   METOPROLOL  SUCCINATE (TOPROL -XL) 25 MG 24 HR TABLET    Take 1 tablet (25 mg total) by mouth daily.   SACUBITRIL -VALSARTAN  (ENTRESTO ) 49-51 MG    Take 1 tablet by mouth 2 (two) times daily.   SILDENAFIL  (VIAGRA ) 50 MG TABLET    Take 1 tablet (50 mg total) by mouth daily as needed for erectile dysfunction.  Modified Medications   No medications on file  Discontinued Medications  No medications on file    Allergies: Allergies[1]  Labs: Lab Results  Component Value Date   HIV1RNAQUANT 37 (H) 07/19/2024   HIV1RNAQUANT 33 (H) 02/12/2024   HIV1RNAQUANT 202 (H) 10/27/2023    RPR and STI Lab Results  Component Value Date   LABRPR NON REACTIVE 10/14/2023        No data to display          Hepatitis B Lab Results  Component Value Date   HEPBSAG NON-REACTIVE 02/12/2024   HEPBCAB NON-REACTIVE 02/12/2024   Hepatitis C Lab Results  Component Value Date   HEPCAB NON-REACTIVE 02/12/2024   Hepatitis A No results found for: HAV Lipids: Lab Results  Component Value Date   CHOL  263 (H) 05/20/2024   TRIG 83 05/20/2024   HDL 69 05/20/2024   CHOLHDL 3.8 05/20/2024   VLDL 20 10/14/2023   LDLCALC 180 (H) 05/20/2024    Target Date: 5th  Assessment: Bruce Yang presents today with his significant other for his maintenance Cabenuva  injections. Past injections were tolerated well without issues. Last HIV RNA was undetectable at 37 copies/mL on 07/19/2024 (prior to Cabenuva  initiation). Doing well with no issues today.  Of note, patient reports having a boil that developed 3 days ago under the left armpit. Reports about the size of a quarter and reports 9/10 pain when the boil is touched. Patient reports noticing occasional boils a few months ago and had to go to the urgent care around October which required incision and drainage. I counseled supportive care includes a warm cloth towel and over the counter pain relievers (ibuprofen  or acetaminophen ). However, I emphasized that if pain does not improve, would recommend going to the urgent care or see a provider to be evaluated.   Lab work:  - HIV RNA, hepatitis A antibody  - Patient politely declines STI testing today   Eligible vaccinations: Heplisav 1/2  - Patient accepts Heplisav 1/2 vaccine today (second dose can be administered during next visit in March 2026)  Cabenuva : Administered cabotegravir  600mg /39mL in left upper outer quadrant of the gluteal muscle. Administered rilpivirine  900 mg/3mL in the right upper outer quadrant of the gluteal muscle. No issues with injections. Bruce Yang will follow up in 2 months for next set of injections.  Plan: - Cabenuva  injections administered - Administered Heplisav 1/2 vaccine into right deltoid (second dose can be given with next Cabenuva  injections in March 2026) - HIV RNA, hepatitis A antibody  - Next injections scheduled for 12/07/2024 with Dr. Overton, then 02/09/2024 with Alan - Call with any issues or questions  Bruce Yang, PharmD PGY2 Infectious Diseases Pharmacy Resident       [1]  Allergies Allergen Reactions   Antihistamines, Chlorpheniramine-Type     Affects Prostate    "

## 2024-10-05 ENCOUNTER — Other Ambulatory Visit: Payer: Self-pay

## 2024-10-05 ENCOUNTER — Ambulatory Visit (INDEPENDENT_AMBULATORY_CARE_PROVIDER_SITE_OTHER): Admitting: Pharmacist

## 2024-10-05 DIAGNOSIS — Z113 Encounter for screening for infections with a predominantly sexual mode of transmission: Secondary | ICD-10-CM

## 2024-10-05 DIAGNOSIS — B20 Human immunodeficiency virus [HIV] disease: Secondary | ICD-10-CM

## 2024-10-05 DIAGNOSIS — Z23 Encounter for immunization: Secondary | ICD-10-CM

## 2024-10-05 MED ORDER — CABOTEGRAVIR & RILPIVIRINE ER 600 & 900 MG/3ML IM SUER
1.0000 | Freq: Once | INTRAMUSCULAR | Status: AC
  Administered 2024-10-05: 1 via INTRAMUSCULAR

## 2024-10-06 NOTE — Progress Notes (Signed)
 Bruce Yang

## 2024-10-07 LAB — HEPATITIS A ANTIBODY, TOTAL: Hepatitis A AB,Total: NONREACTIVE

## 2024-10-07 LAB — HIV-1 RNA QUANT-NO REFLEX-BLD
HIV 1 RNA Quant: <20 {copies}/mL — AB
HIV-1 RNA Quant, Log: <1.3 {Log_copies}/mL — AB

## 2024-10-09 ENCOUNTER — Other Ambulatory Visit (HOSPITAL_BASED_OUTPATIENT_CLINIC_OR_DEPARTMENT_OTHER): Payer: Self-pay

## 2024-10-13 ENCOUNTER — Ambulatory Visit: Attending: Neurology

## 2024-10-13 DIAGNOSIS — R41840 Attention and concentration deficit: Secondary | ICD-10-CM | POA: Insufficient documentation

## 2024-10-13 DIAGNOSIS — R260 Ataxic gait: Secondary | ICD-10-CM | POA: Insufficient documentation

## 2024-10-13 DIAGNOSIS — R41844 Frontal lobe and executive function deficit: Secondary | ICD-10-CM | POA: Diagnosis present

## 2024-10-13 DIAGNOSIS — R41841 Cognitive communication deficit: Secondary | ICD-10-CM | POA: Diagnosis present

## 2024-10-13 DIAGNOSIS — R2689 Other abnormalities of gait and mobility: Secondary | ICD-10-CM | POA: Insufficient documentation

## 2024-10-13 DIAGNOSIS — M6281 Muscle weakness (generalized): Secondary | ICD-10-CM | POA: Diagnosis present

## 2024-10-13 DIAGNOSIS — I6389 Other cerebral infarction: Secondary | ICD-10-CM | POA: Insufficient documentation

## 2024-10-13 DIAGNOSIS — R278 Other lack of coordination: Secondary | ICD-10-CM | POA: Insufficient documentation
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
## 2024-10-20 ENCOUNTER — Other Ambulatory Visit: Payer: Self-pay

## 2024-10-21 ENCOUNTER — Ambulatory Visit

## 2024-10-25 ENCOUNTER — Other Ambulatory Visit (HOSPITAL_BASED_OUTPATIENT_CLINIC_OR_DEPARTMENT_OTHER): Payer: Self-pay
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
## 2024-10-25 NOTE — Assessment & Plan Note (Addendum)
 Blood pressure is well controlled today. Will monitor this for now. His issue may have improved now that he no longer is using methamphetamine.

## 2024-10-25 NOTE — Assessment & Plan Note (Signed)
 Stable. Continue aspirin 81 mg daily and metoprolol succinate 25 mg daily.

## 2024-10-25 NOTE — Assessment & Plan Note (Signed)
 Partially compensated. Continue sacubitril -valsartan  49-51 bid, furosemide  20 mg as needed, empagliflozin  10 mg daily, and metoprolol  succinate 25 mg daily.

## 2024-10-25 NOTE — Assessment & Plan Note (Signed)
 Continue insulin  glargine 16 units daily and empagliflozin  10 mg daily. I will check his A1c today.

## 2024-10-25 NOTE — Assessment & Plan Note (Signed)
 I will try and encourage Bruce Yang to restart his statin over his next few visits.

## 2024-10-26 ENCOUNTER — Other Ambulatory Visit (HOSPITAL_BASED_OUTPATIENT_CLINIC_OR_DEPARTMENT_OTHER): Payer: Self-pay

## 2024-10-26 ENCOUNTER — Ambulatory Visit: Admitting: Family Medicine

## 2024-10-26 ENCOUNTER — Other Ambulatory Visit (HOSPITAL_COMMUNITY): Payer: Self-pay

## 2024-10-26 VITALS — BP 124/68 | HR 96 | Temp 98.1°F | Ht 65.0 in | Wt 174.0 lb

## 2024-10-26 DIAGNOSIS — R911 Solitary pulmonary nodule: Secondary | ICD-10-CM

## 2024-10-26 DIAGNOSIS — B20 Human immunodeficiency virus [HIV] disease: Secondary | ICD-10-CM | POA: Diagnosis not present

## 2024-10-26 DIAGNOSIS — L02411 Cutaneous abscess of right axilla: Secondary | ICD-10-CM | POA: Diagnosis not present

## 2024-10-26 DIAGNOSIS — I5020 Unspecified systolic (congestive) heart failure: Secondary | ICD-10-CM

## 2024-10-26 DIAGNOSIS — I10 Essential (primary) hypertension: Secondary | ICD-10-CM | POA: Diagnosis not present

## 2024-10-26 DIAGNOSIS — Z1211 Encounter for screening for malignant neoplasm of colon: Secondary | ICD-10-CM

## 2024-10-26 DIAGNOSIS — E1159 Type 2 diabetes mellitus with other circulatory complications: Secondary | ICD-10-CM

## 2024-10-26 DIAGNOSIS — I2510 Atherosclerotic heart disease of native coronary artery without angina pectoris: Secondary | ICD-10-CM | POA: Diagnosis not present

## 2024-10-26 DIAGNOSIS — Z794 Long term (current) use of insulin: Secondary | ICD-10-CM

## 2024-10-26 DIAGNOSIS — E785 Hyperlipidemia, unspecified: Secondary | ICD-10-CM

## 2024-10-26 DIAGNOSIS — Z7984 Long term (current) use of oral hypoglycemic drugs: Secondary | ICD-10-CM

## 2024-10-26 DIAGNOSIS — F1911 Other psychoactive substance abuse, in remission: Secondary | ICD-10-CM | POA: Diagnosis not present

## 2024-10-26 MED ORDER — EMPAGLIFLOZIN 10 MG PO TABS
10.0000 mg | ORAL_TABLET | Freq: Every day | ORAL | 3 refills | Status: AC
  Filled 2024-10-26: qty 90, 90d supply, fill #0

## 2024-10-26 MED ORDER — DOXYCYCLINE HYCLATE 100 MG PO TABS
100.0000 mg | ORAL_TABLET | Freq: Two times a day (BID) | ORAL | 0 refills | Status: AC
  Filled 2024-10-26: qty 20, 10d supply, fill #0

## 2024-10-26 NOTE — Assessment & Plan Note (Signed)
 Maintain sobriety at this point. I urged him to continue in this as to not lose ground on his improving health.

## 2024-10-26 NOTE — Assessment & Plan Note (Signed)
 I will order a repeat non-contrast chest CT to follow-up this issue.

## 2024-10-26 NOTE — Assessment & Plan Note (Signed)
 Viral load is now very low. Continue to follow with ID. Continue Cabenuva  every 2 months.

## 2024-10-27 ENCOUNTER — Other Ambulatory Visit (HOSPITAL_COMMUNITY): Payer: Self-pay

## 2024-10-27 ENCOUNTER — Ambulatory Visit: Payer: Self-pay | Admitting: Family Medicine

## 2024-10-27 ENCOUNTER — Ambulatory Visit

## 2024-10-27 ENCOUNTER — Telehealth (HOSPITAL_BASED_OUTPATIENT_CLINIC_OR_DEPARTMENT_OTHER): Payer: Self-pay

## 2024-10-27 ENCOUNTER — Other Ambulatory Visit (HOSPITAL_BASED_OUTPATIENT_CLINIC_OR_DEPARTMENT_OTHER): Payer: Self-pay

## 2024-10-27 DIAGNOSIS — R260 Ataxic gait: Secondary | ICD-10-CM

## 2024-10-27 DIAGNOSIS — R278 Other lack of coordination: Secondary | ICD-10-CM

## 2024-10-27 DIAGNOSIS — I6389 Other cerebral infarction: Secondary | ICD-10-CM

## 2024-10-27 DIAGNOSIS — R41844 Frontal lobe and executive function deficit: Secondary | ICD-10-CM

## 2024-10-27 DIAGNOSIS — R41840 Attention and concentration deficit: Secondary | ICD-10-CM | POA: Diagnosis not present

## 2024-10-27 DIAGNOSIS — M6281 Muscle weakness (generalized): Secondary | ICD-10-CM

## 2024-10-27 DIAGNOSIS — R2689 Other abnormalities of gait and mobility: Secondary | ICD-10-CM

## 2024-10-27 LAB — HEMOGLOBIN A1C: Hgb A1c MFr Bld: 6.1 % (ref 4.6–6.5)

## 2024-10-27 LAB — GLUCOSE, RANDOM: Glucose, Bld: 253 mg/dL — ABNORMAL HIGH (ref 70–99)

## 2024-10-27 NOTE — Telephone Encounter (Signed)
 Pharmacy Patient Advocate Encounter   Received notification from Pt Calls Messages that prior authorization for Dexcom G6 Transmitter  is required/requested.   Insurance verification completed.   The patient is insured through [employer].   Per test claim: PA required; PA submitted to above mentioned insurance via Latent Key/confirmation #/EOC [id number] Status is pending
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
## 2024-10-27 NOTE — Telephone Encounter (Signed)
 Pharmacy Patient Advocate Encounter  Received notification from [employer] that Prior Authorization for Dexcom G6 Transmitter  has been APPROVED from 10/27/24 to 10/27/25. Ran test claim, Copay is $0. This test claim was processed through [facility]- copay amounts may vary at other pharmacies due to pharmacy/plan contracts, or as the patient moves through the different stages of their insurance plan.   PA #/Case ID/Reference #: [id number]

## 2024-11-01 ENCOUNTER — Other Ambulatory Visit (HOSPITAL_COMMUNITY): Payer: Self-pay

## 2024-11-03 ENCOUNTER — Other Ambulatory Visit (HOSPITAL_BASED_OUTPATIENT_CLINIC_OR_DEPARTMENT_OTHER): Payer: Self-pay

## 2024-11-08 ENCOUNTER — Other Ambulatory Visit (HOSPITAL_BASED_OUTPATIENT_CLINIC_OR_DEPARTMENT_OTHER): Payer: Self-pay

## 2024-11-09 ENCOUNTER — Ambulatory Visit

## 2024-11-16 ENCOUNTER — Ambulatory Visit

## 2024-11-23 ENCOUNTER — Ambulatory Visit

## 2024-11-30 ENCOUNTER — Ambulatory Visit

## 2024-12-07 ENCOUNTER — Ambulatory Visit: Payer: Self-pay | Admitting: Internal Medicine

## 2024-12-13 ENCOUNTER — Ambulatory Visit (HOSPITAL_COMMUNITY)

## 2025-01-17 ENCOUNTER — Ambulatory Visit: Admitting: Internal Medicine

## 2025-01-18 ENCOUNTER — Ambulatory Visit: Admitting: Neurology

## 2025-01-24 ENCOUNTER — Ambulatory Visit: Admitting: Family Medicine

## 2025-02-08 ENCOUNTER — Ambulatory Visit: Payer: Self-pay | Admitting: Pharmacist
